# Patient Record
Sex: Male | Born: 1950 | Race: White | Hispanic: No | Marital: Married | Smoking: Former smoker
Health system: Southern US, Community
[De-identification: ages and names within clinical notes are randomized; demographics above are authoritative.]

## PROBLEM LIST (undated history)

## (undated) DIAGNOSIS — K5790 Diverticulosis of intestine, part unspecified, without perforation or abscess without bleeding: Secondary | ICD-10-CM

## (undated) DIAGNOSIS — Z8669 Personal history of other diseases of the nervous system and sense organs: Secondary | ICD-10-CM

## (undated) DIAGNOSIS — F419 Anxiety disorder, unspecified: Secondary | ICD-10-CM

## (undated) DIAGNOSIS — Z860101 Personal history of adenomatous and serrated colon polyps: Secondary | ICD-10-CM

## (undated) DIAGNOSIS — F32A Depression, unspecified: Secondary | ICD-10-CM

## (undated) DIAGNOSIS — F329 Major depressive disorder, single episode, unspecified: Secondary | ICD-10-CM

## (undated) DIAGNOSIS — T7840XA Allergy, unspecified, initial encounter: Secondary | ICD-10-CM

## (undated) DIAGNOSIS — D689 Coagulation defect, unspecified: Secondary | ICD-10-CM

## (undated) DIAGNOSIS — Z87718 Personal history of other specified (corrected) congenital malformations of genitourinary system: Secondary | ICD-10-CM

## (undated) DIAGNOSIS — Z8659 Personal history of other mental and behavioral disorders: Secondary | ICD-10-CM

## (undated) DIAGNOSIS — N183 Chronic kidney disease, stage 3 unspecified: Secondary | ICD-10-CM

## (undated) DIAGNOSIS — I82622 Acute embolism and thrombosis of deep veins of left upper extremity: Secondary | ICD-10-CM

## (undated) DIAGNOSIS — I10 Essential (primary) hypertension: Secondary | ICD-10-CM

## (undated) DIAGNOSIS — H269 Unspecified cataract: Secondary | ICD-10-CM

## (undated) DIAGNOSIS — H409 Unspecified glaucoma: Secondary | ICD-10-CM

## (undated) DIAGNOSIS — Z940 Kidney transplant status: Secondary | ICD-10-CM

## (undated) DIAGNOSIS — E782 Mixed hyperlipidemia: Secondary | ICD-10-CM

## (undated) DIAGNOSIS — N2889 Other specified disorders of kidney and ureter: Secondary | ICD-10-CM

## (undated) DIAGNOSIS — M109 Gout, unspecified: Secondary | ICD-10-CM

## (undated) DIAGNOSIS — Z86010 Personal history of colonic polyps: Secondary | ICD-10-CM

## (undated) DIAGNOSIS — M51369 Other intervertebral disc degeneration, lumbar region without mention of lumbar back pain or lower extremity pain: Secondary | ICD-10-CM

## (undated) DIAGNOSIS — K219 Gastro-esophageal reflux disease without esophagitis: Secondary | ICD-10-CM

## (undated) DIAGNOSIS — K2970 Gastritis, unspecified, without bleeding: Secondary | ICD-10-CM

## (undated) DIAGNOSIS — M5136 Other intervertebral disc degeneration, lumbar region: Secondary | ICD-10-CM

## (undated) DIAGNOSIS — E118 Type 2 diabetes mellitus with unspecified complications: Secondary | ICD-10-CM

## (undated) DIAGNOSIS — M1990 Unspecified osteoarthritis, unspecified site: Secondary | ICD-10-CM

## (undated) DIAGNOSIS — K5792 Diverticulitis of intestine, part unspecified, without perforation or abscess without bleeding: Secondary | ICD-10-CM

## (undated) DIAGNOSIS — Z85828 Personal history of other malignant neoplasm of skin: Secondary | ICD-10-CM

## (undated) HISTORY — DX: Personal history of colonic polyps: Z86.010

## (undated) HISTORY — DX: Personal history of other mental and behavioral disorders: Z86.59

## (undated) HISTORY — DX: Personal history of other malignant neoplasm of skin: Z85.828

## (undated) HISTORY — DX: Unspecified glaucoma: H40.9

## (undated) HISTORY — DX: Diverticulosis of intestine, part unspecified, without perforation or abscess without bleeding: K57.90

## (undated) HISTORY — DX: Allergy, unspecified, initial encounter: T78.40XA

## (undated) HISTORY — DX: Gastritis, unspecified, without bleeding: K29.70

## (undated) HISTORY — DX: Unspecified osteoarthritis, unspecified site: M19.90

## (undated) HISTORY — DX: Unspecified cataract: H26.9

## (undated) HISTORY — DX: Other specified disorders of kidney and ureter: N28.89

## (undated) HISTORY — DX: Other intervertebral disc degeneration, lumbar region: M51.36

## (undated) HISTORY — DX: Type 2 diabetes mellitus with unspecified complications: E11.8

## (undated) HISTORY — DX: Chronic kidney disease, stage 3 (moderate): N18.3

## (undated) HISTORY — DX: Coagulation defect, unspecified: D68.9

## (undated) HISTORY — DX: Kidney transplant status: Z94.0

## (undated) HISTORY — DX: Acute embolism and thrombosis of deep veins of left upper extremity: I82.622

## (undated) HISTORY — DX: Personal history of other diseases of the nervous system and sense organs: Z86.69

## (undated) HISTORY — PX: COLONOSCOPY: SHX174

## (undated) HISTORY — DX: Depression, unspecified: F32.A

## (undated) HISTORY — PX: EYE SURGERY: SHX253

## (undated) HISTORY — DX: Mixed hyperlipidemia: E78.2

## (undated) HISTORY — DX: Other intervertebral disc degeneration, lumbar region without mention of lumbar back pain or lower extremity pain: M51.369

## (undated) HISTORY — DX: Personal history of adenomatous and serrated colon polyps: Z86.0101

## (undated) HISTORY — DX: Anxiety disorder, unspecified: F41.9

## (undated) HISTORY — DX: Gout, unspecified: M10.9

## (undated) HISTORY — DX: Chronic kidney disease, stage 3 unspecified: N18.30

## (undated) HISTORY — PX: JOINT REPLACEMENT: SHX530

## (undated) HISTORY — DX: Major depressive disorder, single episode, unspecified: F32.9

---

## 2002-10-06 ENCOUNTER — Encounter (HOSPITAL_COMMUNITY): Admission: RE | Admit: 2002-10-06 | Discharge: 2003-01-04 | Payer: Self-pay | Admitting: Nephrology

## 2002-12-25 HISTORY — PX: KIDNEY TRANSPLANT: SHX239

## 2003-01-05 ENCOUNTER — Encounter (HOSPITAL_COMMUNITY): Admission: RE | Admit: 2003-01-05 | Discharge: 2003-04-05 | Payer: Self-pay | Admitting: Nephrology

## 2008-01-21 ENCOUNTER — Ambulatory Visit (HOSPITAL_COMMUNITY): Admission: RE | Admit: 2008-01-21 | Discharge: 2008-01-21 | Payer: Self-pay | Admitting: Nephrology

## 2008-01-31 ENCOUNTER — Encounter: Admission: RE | Admit: 2008-01-31 | Discharge: 2008-01-31 | Payer: Self-pay | Admitting: Nephrology

## 2011-01-15 ENCOUNTER — Encounter: Payer: Self-pay | Admitting: Nephrology

## 2012-01-02 DIAGNOSIS — N183 Chronic kidney disease, stage 3 unspecified: Secondary | ICD-10-CM | POA: Diagnosis not present

## 2012-01-02 DIAGNOSIS — Z79899 Other long term (current) drug therapy: Secondary | ICD-10-CM | POA: Diagnosis not present

## 2012-01-02 DIAGNOSIS — Z940 Kidney transplant status: Secondary | ICD-10-CM | POA: Diagnosis not present

## 2012-01-29 DIAGNOSIS — I10 Essential (primary) hypertension: Secondary | ICD-10-CM | POA: Diagnosis not present

## 2012-01-29 DIAGNOSIS — Z940 Kidney transplant status: Secondary | ICD-10-CM | POA: Diagnosis not present

## 2012-01-29 DIAGNOSIS — E785 Hyperlipidemia, unspecified: Secondary | ICD-10-CM | POA: Diagnosis not present

## 2012-01-29 DIAGNOSIS — Z48298 Encounter for aftercare following other organ transplant: Secondary | ICD-10-CM | POA: Diagnosis not present

## 2012-01-29 DIAGNOSIS — Z79899 Other long term (current) drug therapy: Secondary | ICD-10-CM | POA: Diagnosis not present

## 2012-01-30 DIAGNOSIS — H40019 Open angle with borderline findings, low risk, unspecified eye: Secondary | ICD-10-CM | POA: Diagnosis not present

## 2012-02-11 ENCOUNTER — Other Ambulatory Visit: Payer: Self-pay

## 2012-02-11 ENCOUNTER — Inpatient Hospital Stay (HOSPITAL_COMMUNITY)
Admission: EM | Admit: 2012-02-11 | Discharge: 2012-02-14 | DRG: 378 | Disposition: A | Discharge status: Home / Self Care | Payer: Managed Care, Other (non HMO) | Attending: Internal Medicine | Admitting: Internal Medicine

## 2012-02-11 ENCOUNTER — Encounter (HOSPITAL_COMMUNITY): Payer: Self-pay | Admitting: Family Medicine

## 2012-02-11 ENCOUNTER — Emergency Department (HOSPITAL_COMMUNITY): Payer: Managed Care, Other (non HMO)

## 2012-02-11 DIAGNOSIS — E119 Type 2 diabetes mellitus without complications: Secondary | ICD-10-CM | POA: Diagnosis not present

## 2012-02-11 DIAGNOSIS — K922 Gastrointestinal hemorrhage, unspecified: Principal | ICD-10-CM | POA: Diagnosis present

## 2012-02-11 DIAGNOSIS — K648 Other hemorrhoids: Secondary | ICD-10-CM | POA: Diagnosis present

## 2012-02-11 DIAGNOSIS — IMO0001 Reserved for inherently not codable concepts without codable children: Secondary | ICD-10-CM | POA: Diagnosis not present

## 2012-02-11 DIAGNOSIS — E785 Hyperlipidemia, unspecified: Secondary | ICD-10-CM

## 2012-02-11 DIAGNOSIS — R109 Unspecified abdominal pain: Secondary | ICD-10-CM | POA: Diagnosis present

## 2012-02-11 DIAGNOSIS — Z87891 Personal history of nicotine dependence: Secondary | ICD-10-CM

## 2012-02-11 DIAGNOSIS — D371 Neoplasm of uncertain behavior of stomach: Secondary | ICD-10-CM | POA: Diagnosis present

## 2012-02-11 DIAGNOSIS — Q613 Polycystic kidney, unspecified: Secondary | ICD-10-CM

## 2012-02-11 DIAGNOSIS — I10 Essential (primary) hypertension: Secondary | ICD-10-CM

## 2012-02-11 DIAGNOSIS — E118 Type 2 diabetes mellitus with unspecified complications: Secondary | ICD-10-CM

## 2012-02-11 DIAGNOSIS — N289 Disorder of kidney and ureter, unspecified: Secondary | ICD-10-CM

## 2012-02-11 DIAGNOSIS — Z940 Kidney transplant status: Secondary | ICD-10-CM

## 2012-02-11 DIAGNOSIS — K644 Residual hemorrhoidal skin tags: Secondary | ICD-10-CM | POA: Diagnosis present

## 2012-02-11 DIAGNOSIS — IMO0002 Reserved for concepts with insufficient information to code with codable children: Secondary | ICD-10-CM

## 2012-02-11 DIAGNOSIS — Z87718 Personal history of other specified (corrected) congenital malformations of genitourinary system: Secondary | ICD-10-CM

## 2012-02-11 HISTORY — DX: Gastro-esophageal reflux disease without esophagitis: K21.9

## 2012-02-11 HISTORY — DX: Personal history of other specified (corrected) congenital malformations of genitourinary system: Z87.718

## 2012-02-11 HISTORY — DX: Essential (primary) hypertension: I10

## 2012-02-11 LAB — CBC
HCT: 40.2 % (ref 39.0–52.0)
Platelets: 159 10*3/uL (ref 150–400)
RDW: 14.1 % (ref 11.5–15.5)
WBC: 11.9 10*3/uL — ABNORMAL HIGH (ref 4.0–10.5)

## 2012-02-11 LAB — COMPREHENSIVE METABOLIC PANEL
ALT: 15 U/L (ref 0–53)
AST: 14 U/L (ref 0–37)
Albumin: 3.4 g/dL — ABNORMAL LOW (ref 3.5–5.2)
CO2: 20 mEq/L (ref 19–32)
Chloride: 101 mEq/L (ref 96–112)
GFR calc non Af Amer: 52 mL/min — ABNORMAL LOW (ref >90)
Potassium: 3.4 mEq/L — ABNORMAL LOW (ref 3.5–5.1)
Sodium: 134 mEq/L — ABNORMAL LOW (ref 135–145)
Total Bilirubin: 0.5 mg/dL (ref 0.3–1.2)

## 2012-02-11 LAB — DIFFERENTIAL
Basophils Absolute: 0 10*3/uL (ref 0.0–0.1)
Lymphocytes Relative: 24 % (ref 12–46)
Monocytes Absolute: 0.8 10*3/uL (ref 0.1–1.0)
Neutro Abs: 8.1 10*3/uL — ABNORMAL HIGH (ref 1.7–7.7)
Neutrophils Relative %: 68 % (ref 43–77)

## 2012-02-11 LAB — TYPE AND SCREEN: Antibody Screen: NEGATIVE

## 2012-02-11 MED ORDER — SODIUM CHLORIDE 0.9 % IV BOLUS (SEPSIS)
1000.0000 mL | Freq: Once | INTRAVENOUS | Status: AC
  Administered 2012-02-11 (×2): 1000 mL via INTRAVENOUS

## 2012-02-11 MED ORDER — TACROLIMUS 1 MG PO CAPS
2.0000 mg | ORAL_CAPSULE | Freq: Two times a day (BID) | ORAL | Status: DC
  Administered 2012-02-12 (×2): 2 mg via ORAL
  Filled 2012-02-11 (×5): qty 2

## 2012-02-11 MED ORDER — PANTOPRAZOLE SODIUM 40 MG IV SOLR
40.0000 mg | Freq: Two times a day (BID) | INTRAVENOUS | Status: DC
  Administered 2012-02-12 – 2012-02-13 (×4): 40 mg via INTRAVENOUS
  Filled 2012-02-11 (×5): qty 40

## 2012-02-11 MED ORDER — ALPRAZOLAM 0.25 MG PO TABS
0.2500 mg | ORAL_TABLET | Freq: Every evening | ORAL | Status: DC | PRN
  Administered 2012-02-12 – 2012-02-13 (×2): 0.25 mg via ORAL
  Filled 2012-02-11 (×2): qty 1

## 2012-02-11 MED ORDER — POTASSIUM CHLORIDE IN NACL 20-0.9 MEQ/L-% IV SOLN
INTRAVENOUS | Status: DC
  Administered 2012-02-12 – 2012-02-14 (×6): via INTRAVENOUS
  Filled 2012-02-11 (×11): qty 1000

## 2012-02-11 MED ORDER — METOPROLOL TARTRATE 50 MG PO TABS
50.0000 mg | ORAL_TABLET | Freq: Two times a day (BID) | ORAL | Status: DC
  Administered 2012-02-12 – 2012-02-14 (×5): 50 mg via ORAL
  Filled 2012-02-11: qty 1
  Filled 2012-02-11: qty 2
  Filled 2012-02-11 (×5): qty 1

## 2012-02-11 MED ORDER — MYCOPHENOLATE MOFETIL 250 MG PO CAPS
250.0000 mg | ORAL_CAPSULE | Freq: Two times a day (BID) | ORAL | Status: DC
  Administered 2012-02-12 (×2): 250 mg via ORAL
  Filled 2012-02-11 (×5): qty 1

## 2012-02-11 NOTE — ED Provider Notes (Signed)
History     CSN: [id number]  Arrival date & time 02/11/12  1847   First MD Initiated Contact with Patient 02/11/12 2002      Chief Complaint  Patient presents with  . GI Bleeding    (Consider location/radiation/quality/duration/timing/severity/associated sxs/prior treatment) The history is provided by the patient.   61 year old male comes in with bright red blood per temp x3 this afternoon. This is associated with crampy lower abdominal pain which is worse on the right. Pain was severe and rated at 9/10 at its worst, but it is mild and rated at 2/10 currently. There is associated nausea but no vomiting. He has no prior history of rectal bleeding. He states he had a colonoscopy 5 years ago and was told it was normal. He has noticed dizziness and lightheadedness. He denies a history of diverticulosis. He is on aspirin daily but denies being on any other anticoagulants or other antiplatelet medications. 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  No past medical history on file.  No past surgical history on file.  No family history on file.  History  Substance Use Topics  . Smoking status: Not on file  . Smokeless tobacco: Not on file  . Alcohol Use: Not on file      Review of Systems  All other systems reviewed and are negative.    Allergies  Penicillins  Home Medications   Current Outpatient Rx  Name Route Sig Dispense Refill  . ALPRAZOLAM 0.25 MG PO TABS Oral Take 0.25 mg by mouth at bedtime as needed. PAIN    . AMLODIPINE BESYLATE 2.5 MG PO TABS Oral Take 2.5 mg by mouth daily.    Marland Kitchen VITAMIN D 1000 UNITS PO TABS Oral Take 1,000 Units by mouth daily.    Marland Kitchen FLUOXETINE HCL 20 MG PO CAPS Oral Take 20 mg by mouth daily.    Marland Kitchen METOPROLOL TARTRATE 50 MG PO TABS Oral Take 50 mg by mouth 2 (two) times daily.    Marland Kitchen MYCOPHENOLATE MOFETIL 250 MG PO CAPS Oral Take by mouth 2 (two) times daily.    Marland Kitchen PREDNISONE 5 MG PO TABS Oral Take 5  mg by mouth daily.    Marland Kitchen SIMVASTATIN 5 MG PO TABS Oral Take 5 mg by mouth daily.    Marland Kitchen TACROLIMUS 1 MG PO CAPS Oral Take 2 mg by mouth 2 (two) times daily. TAKE 2 TABLETS IN THE AM AND  1 TABLET INT THE PM    . VITAMIN B-12 1000 MCG PO TABS Oral Take 1,000 mcg by mouth daily.      BP 109/60  Pulse 98  Resp 22  SpO2 99%  Physical Exam  Nursing note and vitals reviewed.  61 year old male who is resting comfortably and in no acute distress. Vital signs are significant for mild tachypnea with respiratory rate of 22. Oxygen saturation is 99% which is normal. Head is normocephalic and atraumatic. PERRLA, EOMI appeared oropharynx is clear. Neck is nontender and supple. Back is nontender. Lungs are clear without rales, wheezes, rhonchi. Heart has regular rate and rhythm without murmur. Abdomen is soft, flat, with mild tenderness in the right lower abdomen without rebound or guarding. Peristalsis is hyperactive. Transplanted kidney is palpable in the left lower quadrant. Extremities have no cyanosis or edema, full range of motion is present. Skin is warm and dry without rash. Neurologic: Mental status is normal, cranial nerves are intact, there no focal motor or sensory deficits.  ED Course  Procedures (including critical care time)  Results for orders placed during the hospital encounter of 02/11/12  CBC      Component Value Range   WBC 11.9 (*) 4.0 - 10.5 (K/uL)   RBC 4.15 (*) 4.22 - 5.81 (MIL/uL)   Hemoglobin 13.5  13.0 - 17.0 (g/dL)   HCT 16.1  09.6 - 04.5 (%)   MCV 96.9  78.0 - 100.0 (fL)   MCH 32.5  26.0 - 34.0 (pg)   MCHC 33.6  30.0 - 36.0 (g/dL)   RDW 40.9  81.1 - 91.4 (%)   Platelets 159  150 - 400 (K/uL)  DIFFERENTIAL      Component Value Range   Neutrophils Relative 68  43 - 77 (%)   Neutro Abs 8.1 (*) 1.7 - 7.7 (K/uL)   Lymphocytes Relative 24  12 - 46 (%)   Lymphs Abs 2.8  0.7 - 4.0 (K/uL)   Monocytes Relative 7  3 - 12 (%)   Monocytes Absolute 0.8  0.1 - 1.0 (K/uL)    Eosinophils Relative 1  0 - 5 (%)   Eosinophils Absolute 0.1  0.0 - 0.7 (K/uL)   Basophils Relative 0  0 - 1 (%)   Basophils Absolute 0.0  0.0 - 0.1 (K/uL)  COMPREHENSIVE METABOLIC PANEL      Component Value Range   Sodium 134 (*) 135 - 145 (mEq/L)   Potassium 3.4 (*) 3.5 - 5.1 (mEq/L)   Chloride 101  96 - 112 (mEq/L)   CO2 20  19 - 32 (mEq/L)   Glucose, Bld 188 (*) 70 - 99 (mg/dL)   BUN 29 (*) 6 - 23 (mg/dL)   Creatinine, Ser 7.82 (*) 0.50 - 1.35 (mg/dL)   Calcium 8.9  8.4 - 95.6 (mg/dL)   Total Protein 6.3  6.0 - 8.3 (g/dL)   Albumin 3.4 (*) 3.5 - 5.2 (g/dL)   AST 14  0 - 37 (U/L)   ALT 15  0 - 53 (U/L)   Alkaline Phosphatase 71  39 - 117 (U/L)   Total Bilirubin 0.5  0.3 - 1.2 (mg/dL)   GFR calc non Af Amer 52 (*) >90 (mL/min)   GFR calc Af Amer 61 (*) >90 (mL/min)  PROTIME-INR      Component Value Range   Prothrombin Time 15.1  11.6 - 15.2 (seconds)   INR 1.17  0.00 - 1.49   APTT      Component Value Range   aPTT 35  24 - 37 (seconds)  TYPE AND SCREEN      Component Value Range   ABO/RH(D) A NEG     Antibody Screen NEG     Sample Expiration 02/14/2012    ABO/RH      Component Value Range   ABO/RH(D) A NEG     Dg Chest Portable 1 View  02/11/2012  *RADIOLOGY REPORT*  Clinical Data: GI bleeding; history of diabetes and hypertension.  PORTABLE CHEST - 1 VIEW  Comparison: None.  Findings: The lungs are well-aerated and clear.  There is no evidence of focal opacification, pleural effusion or pneumothorax.  The cardiomediastinal silhouette is within normal limits.  No acute osseous abnormalities are seen.  IMPRESSION: No acute cardiopulmonary process seen.  Original Report Authenticated By: Tonia Ghent, M.D.      Date: 02/11/2012  Rate: 74  Rhythm: normal sinus rhythm  QRS Axis: normal  Intervals: normal  ST/T Wave abnormalities: nonspecific T wave changes  Conduction Disutrbances:none  Narrative Interpretation:  Nonspecific T wave flattening. No old ECG available for  comparison.  Old EKG Reviewed: none available  Hemoglobin is normal, but it may take some time for hemoglobin did drop after acute bleeding. His blood pressure dropped to 91 systolic and will be given additional saline bolus. I discussed with case with Dr. Ewing Schlein will see the patient in consult. Case is also discussed with Dr. Joneen Roach who agrees to come and admit the patient.   1. Lower GI bleed   2. History of renal transplant   3. Renal insufficiency       MDM  Rectal bleeding which was confirmed by RN who is all a commode hold blood will after the patient went to the bathroom in the emergency department. 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 However, I suspect that he may have actually had diverticulosis which she does not recall, and that is also likely etiology. He will be given IV fluids and low blood will be obtained for type and screen. He will need to be admitted and GI consultation will be needed.        Dione Booze, MD 02/11/12 2242

## 2012-02-11 NOTE — H&P (Signed)
PCP:   James Boys, MD, MD  GI Dr. Randa Zimmerman with [facility] Nephrologist Dr. Ernie Zimmerman with [facility]   Chief Complaint:  Blood in stool  HPI: This is a 61 year old gentleman who states that today at approximately 5:00 PM he started passing blood in the stool. He states the color has ranged from red to dark red. He does report pain in the right lower quadrant, along with cramping. He states he's not coughed up blood. But he does take a baby aspirin daily, he also take by mouth steroids daily - a low dosage. He states he eats a lot of spicy food and has a mark GERD, for which she now takes an over-the-counter medications for. He additionally states he drinks 3-4 glasses of wine daily. He has never had a history of a stomach ulcer or a GI bleed. His last colonoscopy was 5 years ago, he states this was normal. Patient has a history of renal transplant and he states his baseline creatinine is approximately 1.3 - 1.5.  Review of Systems: Positives bolded   anorexia, fever, weight loss,, vision loss, decreased hearing, hoarseness, chest pain, syncope, dyspnea on exertion, peripheral edema, balance deficits, hemoptysis, abdominal pain, melena, hematochezia, severe indigestion/heartburn, hematuria, incontinence, genital sores, muscle weakness, suspicious skin lesions, transient blindness, difficulty walking, depression, unusual weight change, abnormal bleeding, enlarged lymph nodes, angioedema, and breast masses.  Past Medical History: Past Medical History  Diagnosis Date  . Diabetes mellitus   . H/O polycystic kidney disease   . Hypertension   . Hyperlipidemia    Past Surgical History  Procedure Date  . Kidney transplant     Medications: Prior to Admission medications   Medication Sig Start Date End Date Taking? Authorizing Provider  ALPRAZolam (XANAX) 0.25 MG tablet Take 0.25 mg by mouth at bedtime as needed. PAIN   Yes Historical Provider, MD  amLODipine (NORVASC) 2.5 MG tablet Take  2.5 mg by mouth daily.   Yes Historical Provider, MD  cholecalciferol (VITAMIN D) 1000 UNITS tablet Take 1,000 Units by mouth daily.   Yes Historical Provider, MD  FLUoxetine (PROZAC) 20 MG capsule Take 20 mg by mouth daily.   Yes Historical Provider, MD  metoprolol (LOPRESSOR) 50 MG tablet Take 50 mg by mouth 2 (two) times daily.   Yes Historical Provider, MD  mycophenolate (CELLCEPT) 250 MG capsule Take by mouth 2 (two) times daily.   Yes Historical Provider, MD  predniSONE (DELTASONE) 5 MG tablet Take 5 mg by mouth daily.   Yes Historical Provider, MD  simvastatin (ZOCOR) 5 MG tablet Take 5 mg by mouth daily.   Yes Historical Provider, MD  tacrolimus (PROGRAF) 1 MG capsule Take 2 mg by mouth 2 (two) times daily. TAKE 2 TABLETS IN THE AM AND  1 TABLET INT THE PM   Yes Historical Provider, MD  vitamin B-12 (CYANOCOBALAMIN) 1000 MCG tablet Take 1,000 mcg by mouth daily.   Yes Historical Provider, MD    Allergies:   Allergies  Allergen Reactions  . Penicillins Rash    Social History:  reports that he has quit smoking. He does not have any smokeless tobacco history on file. He reports that he drinks alcohol. He reports that he does not use illicit drugs.  Family History: Family History  Problem Relation Age of Onset  . Polycystic kidney disease      Physical Exam: Filed Vitals:   02/11/12 2245 02/11/12 2300 02/11/12 2315 02/11/12 2330  BP: 115/66 111/67 110/71 112/67  Pulse: 65  67 67 65  Resp: 24 19 19 23   SpO2: 100% 99% 100% 99%    General:  Alert and oriented times three, well developed and nourished, no acute distress Eyes: PERRLA, pink conjunctiva, no scleral icterus ENT: Moist oral mucosa, neck supple, no thyromegaly Lungs: clear to ascultation, no wheeze, no crackles, no use of accessory muscles Cardiovascular: regular rate and rhythm, no regurgitation, no gallops, no murmurs. No carotid bruits, no JVD Abdomen: soft, positive BS, nonspecific tenderness to palpation,  non-distended, no organomegaly, not an acute abdomen GU: not examined Neuro: CN II - XII grossly intact, sensation intact Musculoskeletal: strength 5/5 all extremities, no clubbing, cyanosis or edema Skin: no rash, no subcutaneous crepitation, no decubitus Psych: appropriate patient   Labs on Admission:   [facility] 02/11/12 1943  NA 134*  K 3.4*  CL 101  CO2 20  GLUCOSE 188*  BUN 29*  CREATININE 1.41*  CALCIUM 8.9  MG --  PHOS --  Results for James Zimmerman (MRN [id number]) as of 02/11/2012 23:37  Ref. Range 02/11/2012 20:55  Prothrombin Time Latest Range: 11.6-15.2 seconds 15.1  INR Latest Range: 0.00-1.49  1.17  aPTT Latest Range: 24-37 seconds 35    Basename 02/11/12 1943  AST 14  ALT 15  ALKPHOS 71  BILITOT 0.5  PROT 6.3  ALBUMIN 3.4*   No results found for this basename: LIPASE:2,AMYLASE:2 in the last 72 hours  Basename 02/11/12 1943  WBC 11.9*  NEUTROABS 8.1*  HGB 13.5  HCT 40.2  MCV 96.9  PLT 159   No results found for this basename: CKTOTAL:3,CKMB:3,CKMBINDEX:3,TROPONINI:3 in the last 72 hours No components found with this basename: POCBNP:3 No results found for this basename: DDIMER:2 in the last 72 hours No results found for this basename: HGBA1C:2 in the last 72 hours No results found for this basename: CHOL:2,HDL:2,LDLCALC:2,TRIG:2,CHOLHDL:2,LDLDIRECT:2 in the last 72 hours No results found for this basename: TSH,T4TOTAL,FREET3,T3FREE,THYROIDAB in the last 72 hours No results found for this basename: VITAMINB12:2,FOLATE:2,FERRITIN:2,TIBC:2,IRON:2,RETICCTPCT:2 in the last 72 hours  Micro Results: No results found for this or any previous visit (from the past 240 hour(s)).   Radiological Exams on Admission: Dg Chest Portable 1 View  02/11/2012  *RADIOLOGY REPORT*  Clinical Data: GI bleeding; history of diabetes and hypertension.  PORTABLE CHEST - 1 VIEW  Comparison: None.  Findings: The lungs are well-aerated and clear.  There is no evidence of  focal opacification, pleural effusion or pneumothorax.  The cardiomediastinal silhouette is within normal limits.  No acute osseous abnormalities are seen.  IMPRESSION: No acute cardiopulmonary process seen.  Original Report Authenticated By: Tonia Ghent, M.D.    Assessment/Plan Present on Admission:  .GI bleed Admit to telemetry May be upper or lower GI bleed. Patient does have her structures for stomach ulcers including steroid and aspirin use daily spicy foods and chronic GERD N.p.o., IV fluids Type and cross and transfuse as needed GI consulted IV Protonix  Status post renal transplant History of polycystic kidney disease Stable, Prograf level ordered. Unable to find CellCept level to order Patient sees Dr. Ernie Zimmerman with [facility] Diabetes mellitus Hypertension Patient is n.p.o. Stable home medications resume with hold parameters for blood pressure medications for systolic blood pressure less than 120  Full code SCDs for DVT prophylaxis Team 2/Dr. Domingo Madeira, Gissel Keilman 02/11/2012, 11:37 PM

## 2012-02-11 NOTE — ED Notes (Signed)
Patient arrived with complaint of passing lots of blood in stool. Used restroom at triage and again passed large amounts of bright red blood, patient pale and weak, near syncopal

## 2012-02-12 ENCOUNTER — Encounter (HOSPITAL_COMMUNITY): Payer: Self-pay | Admitting: Gastroenterology

## 2012-02-12 DIAGNOSIS — IMO0002 Reserved for concepts with insufficient information to code with codable children: Secondary | ICD-10-CM | POA: Diagnosis not present

## 2012-02-12 DIAGNOSIS — I10 Essential (primary) hypertension: Secondary | ICD-10-CM | POA: Diagnosis present

## 2012-02-12 DIAGNOSIS — D131 Benign neoplasm of stomach: Secondary | ICD-10-CM | POA: Diagnosis not present

## 2012-02-12 DIAGNOSIS — K648 Other hemorrhoids: Secondary | ICD-10-CM | POA: Diagnosis present

## 2012-02-12 DIAGNOSIS — K921 Melena: Secondary | ICD-10-CM | POA: Diagnosis not present

## 2012-02-12 DIAGNOSIS — Z87891 Personal history of nicotine dependence: Secondary | ICD-10-CM | POA: Diagnosis not present

## 2012-02-12 DIAGNOSIS — K644 Residual hemorrhoidal skin tags: Secondary | ICD-10-CM | POA: Diagnosis present

## 2012-02-12 DIAGNOSIS — K5730 Diverticulosis of large intestine without perforation or abscess without bleeding: Secondary | ICD-10-CM | POA: Diagnosis not present

## 2012-02-12 DIAGNOSIS — D371 Neoplasm of uncertain behavior of stomach: Secondary | ICD-10-CM | POA: Diagnosis present

## 2012-02-12 DIAGNOSIS — R109 Unspecified abdominal pain: Secondary | ICD-10-CM | POA: Diagnosis present

## 2012-02-12 DIAGNOSIS — E119 Type 2 diabetes mellitus without complications: Secondary | ICD-10-CM | POA: Diagnosis present

## 2012-02-12 DIAGNOSIS — E785 Hyperlipidemia, unspecified: Secondary | ICD-10-CM | POA: Diagnosis present

## 2012-02-12 DIAGNOSIS — D500 Iron deficiency anemia secondary to blood loss (chronic): Secondary | ICD-10-CM | POA: Diagnosis not present

## 2012-02-12 DIAGNOSIS — K922 Gastrointestinal hemorrhage, unspecified: Secondary | ICD-10-CM | POA: Diagnosis present

## 2012-02-12 DIAGNOSIS — Z940 Kidney transplant status: Secondary | ICD-10-CM | POA: Diagnosis not present

## 2012-02-12 DIAGNOSIS — K449 Diaphragmatic hernia without obstruction or gangrene: Secondary | ICD-10-CM | POA: Diagnosis not present

## 2012-02-12 DIAGNOSIS — Q613 Polycystic kidney, unspecified: Secondary | ICD-10-CM | POA: Diagnosis not present

## 2012-02-12 DIAGNOSIS — K209 Esophagitis, unspecified without bleeding: Secondary | ICD-10-CM | POA: Diagnosis not present

## 2012-02-12 DIAGNOSIS — K5732 Diverticulitis of large intestine without perforation or abscess without bleeding: Secondary | ICD-10-CM | POA: Diagnosis not present

## 2012-02-12 DIAGNOSIS — D126 Benign neoplasm of colon, unspecified: Secondary | ICD-10-CM | POA: Diagnosis not present

## 2012-02-12 DIAGNOSIS — K319 Disease of stomach and duodenum, unspecified: Secondary | ICD-10-CM | POA: Diagnosis not present

## 2012-02-12 LAB — CBC
HCT: 33.4 % — ABNORMAL LOW (ref 39.0–52.0)
MCH: 32.1 pg (ref 26.0–34.0)
MCV: 97.4 fL (ref 78.0–100.0)
Platelets: 121 10*3/uL — ABNORMAL LOW (ref 150–400)
RDW: 14.4 % (ref 11.5–15.5)
WBC: 8.2 10*3/uL (ref 4.0–10.5)

## 2012-02-12 LAB — BASIC METABOLIC PANEL
BUN: 24 mg/dL — ABNORMAL HIGH (ref 6–23)
BUN: 19 mg/dL (ref 6–23)
CO2: 22 mEq/L (ref 19–32)
CO2: 22 mEq/L (ref 19–32)
Calcium: 7.9 mg/dL — ABNORMAL LOW (ref 8.4–10.5)
Chloride: 108 mEq/L (ref 96–112)
Chloride: 109 mEq/L (ref 96–112)
Creatinine, Ser: 1.34 mg/dL (ref 0.50–1.35)
GFR calc Af Amer: 66 mL/min — ABNORMAL LOW (ref >90)
Glucose, Bld: 147 mg/dL — ABNORMAL HIGH (ref 70–99)
Potassium: 4.2 mEq/L (ref 3.5–5.1)

## 2012-02-12 LAB — ABO/RH: ABO/RH(D): A NEG

## 2012-02-12 LAB — HEMOGLOBIN AND HEMATOCRIT, BLOOD
Hemoglobin: 11.2 g/dL — ABNORMAL LOW (ref 13.0–17.0)
Hemoglobin: 11.2 g/dL — ABNORMAL LOW (ref 13.0–17.0)

## 2012-02-12 LAB — GLUCOSE, CAPILLARY: Glucose-Capillary: 128 mg/dL — ABNORMAL HIGH (ref 70–99)

## 2012-02-12 MED ORDER — INSULIN ASPART 100 UNIT/ML ~~LOC~~ SOLN
0.0000 [IU] | Freq: Three times a day (TID) | SUBCUTANEOUS | Status: DC
  Administered 2012-02-12: 1 [IU] via SUBCUTANEOUS
  Filled 2012-02-12: qty 3

## 2012-02-12 MED ORDER — MORPHINE SULFATE 2 MG/ML IJ SOLN: 1.0000 mg | INTRAMUSCULAR | Status: DC | PRN

## 2012-02-12 MED ORDER — PREDNISONE 5 MG PO TABS
5.0000 mg | ORAL_TABLET | Freq: Every day | ORAL | Status: DC
  Administered 2012-02-12 – 2012-02-14 (×3): 5 mg via ORAL
  Filled 2012-02-12 (×3): qty 1

## 2012-02-12 MED ORDER — PEG 3350-KCL-NA BICARB-NACL 420 G PO SOLR
4000.0000 mL | Freq: Once | ORAL | Status: AC
  Administered 2012-02-12: 4000 mL via ORAL

## 2012-02-12 MED ORDER — AMLODIPINE BESYLATE 2.5 MG PO TABS
2.5000 mg | ORAL_TABLET | Freq: Every day | ORAL | Status: DC
  Administered 2012-02-12 – 2012-02-14 (×3): 2.5 mg via ORAL
  Filled 2012-02-12 (×3): qty 1

## 2012-02-12 MED ORDER — INSULIN ASPART 100 UNIT/ML ~~LOC~~ SOLN
0.0000 [IU] | Freq: Every day | SUBCUTANEOUS | Status: DC
  Filled 2012-02-12: qty 3

## 2012-02-12 MED ORDER — FLUOXETINE HCL 20 MG PO CAPS
20.0000 mg | ORAL_CAPSULE | Freq: Every day | ORAL | Status: DC
  Administered 2012-02-12 – 2012-02-13 (×2): 20 mg via ORAL
  Filled 2012-02-12 (×3): qty 1

## 2012-02-12 MED ORDER — SIMVASTATIN 5 MG PO TABS
5.0000 mg | ORAL_TABLET | Freq: Every day | ORAL | Status: DC
  Administered 2012-02-12 – 2012-02-13 (×2): 5 mg via ORAL
  Filled 2012-02-12 (×3): qty 1

## 2012-02-12 NOTE — Progress Notes (Signed)
UR completed 

## 2012-02-12 NOTE — Progress Notes (Signed)
Patient ID: James Zimmerman    ZOX:[id number]    DOB: 05-19-1951    DOA: 02/11/2012  PCP: Lenora Boys, MD, MD  Subjective: No BM since morning  Objective: Weight change:   Intake/Output Summary (Last 24 hours) at 02/12/12 1437 Last data filed at 02/12/12 1414  Gross per 24 hour  Intake    360 ml  Output      6 ml  Net    354 ml   Blood pressure 129/77, pulse 67, temperature 97.2 F (36.2 C), temperature source Oral, resp. rate 16, height 5' 11.65" (1.82 m), weight 103.3 kg (227 lb 11.8 oz), SpO2 96.00%.  Physical Exam: General: Alert and awake, oriented x3, not in any acute distress. HEENT: anicteric sclera, pupils reactive to light and accommodation, EOMI CVS: S1-S2 clear, no murmur rubs or gallops Chest: clear to auscultation bilaterally, no wheezing, rales or rhonchi Abdomen: soft nontender, nondistended, normal bowel sounds, no organomegaly Extremities: no cyanosis, clubbing or edema noted bilaterally Neuro: Cranial nerves II-XII intact, no focal neurological deficits  Lab Results: Basic Metabolic Panel:  Lab 02/12/12 8119 02/12/12 0310  NA 138 138  K 4.2 3.9  CL 109 108  CO2 22 22  GLUCOSE 153* 147*  BUN 19 24*  CREATININE 1.32 1.34  CALCIUM 8.1* 7.9*  MG -- --  PHOS -- --   Liver Function Tests:  Lab 02/11/12 1943  AST 14  ALT 15  ALKPHOS 71  BILITOT 0.5  PROT 6.3  ALBUMIN 3.4*   CBC:  Lab 02/12/12 1110 02/12/12 0310 02/11/12 1943  WBC -- 8.2 11.9*  NEUTROABS -- -- 8.1*  HGB 11.2* 11.0* --  HCT 33.8* 33.4* --  MCV -- 97.4 96.9  PLT -- 121* 159   Studies/Results: Dg Chest Portable 1 View  02/11/2012  *RADIOLOGY REPORT*  Clinical Data: GI bleeding; history of diabetes and hypertension.  PORTABLE CHEST - 1 VIEW  Comparison: None.  Findings: The lungs are well-aerated and clear.  There is no evidence of focal opacification, pleural effusion or pneumothorax.  The cardiomediastinal silhouette is within normal limits.  No acute osseous  abnormalities are seen.  IMPRESSION: No acute cardiopulmonary process seen.  Original Report Authenticated By: Tonia Ghent, M.D.    Medications: Scheduled Meds:   . amLODipine  2.5 mg Oral Daily  . FLUoxetine  20 mg Oral Daily  . metoprolol  50 mg Oral BID  . mycophenolate  250 mg Oral BID  . pantoprazole (PROTONIX) IV  40 mg Intravenous Q12H  . polyethylene glycol-electrolytes  4,000 mL Oral Once  . predniSONE  5 mg Oral Q breakfast  . simvastatin  5 mg Oral q1800  . sodium chloride  1,000 mL Intravenous Once  . sodium chloride  1,000 mL Intravenous Once  . tacrolimus  2 mg Oral BID   Continuous Infusions:   . 0.9 % NaCl with KCl 20 mEq / L 100 mL/hr at 02/12/12 1157     Assessment/Plan: Principal Problem:  *GI bleed: - Continue H&H every 12 hours, clear liquid diet, EGD and colonoscopy planned for tomorrow morning  Active Problems:  H/O polycystic kidney disease: Continue prednisone, tacrolimus, CellCept   Diabetes mellitus - Obtain HbA1c, place on sliding scale insulin   Hypertension: Stable, on Norvasc and Lopressor   Hyperlipidemia: On Zocor  DVT Prophylaxis: SCDs  Code Status: Full code  Disposition: Pending EGD/coloscopy tomorrow   LOS: 1 day   Kriste Broman M.D. [facility] 02/12/2012, 2:37 PM Pager: [phone]

## 2012-02-12 NOTE — Consult Note (Signed)
Reason for Consult: GI bleeding probably lower Referring Physician: Hospital team  James Zimmerman is an 61 y.o. male.  HPI: Patient seen by my partner 5-1/2 years ago with a normal screening colonoscopy who has had some vague GI symptoms for years including occasional right-sided pain and some diarrhea since his transplant and some upper tract symptoms helped by Nexium and more recently omeprazole who began bleeding yesterday mostly bright red possibly with a few clots but some dark blood in the commode however he seems to be better now. And has not had a bowel movement since last night. He's had no previous bleeding but is on an aspirin a day and minimizes other over-the-counter medicine. His family history is pertinent for mother with ulcers but no other GI issues Past Medical History  Diagnosis Date  . Diabetes mellitus   . H/O polycystic kidney disease   . Hypertension   . Hyperlipidemia     Past Surgical History  Procedure Date  . Kidney transplant     Family History  Problem Relation Age of Onset  . Polycystic kidney disease      Social History:  reports that he has quit smoking. He does not have any smokeless tobacco history on file. He reports that he drinks alcohol. He reports that he does not use illicit drugs.  Allergies:  Allergies  Allergen Reactions  . Penicillins Rash    Medications: I have reviewed the patient's current medications.  Results for orders placed during the hospital encounter of 02/11/12 (from the past 48 hour(s))  CBC     Status: Abnormal   Collection Time   02/11/12  7:43 PM      Component Value Range Comment   WBC 11.9 (*) 4.0 - 10.5 (K/uL)    RBC 4.15 (*) 4.22 - 5.81 (MIL/uL)    Hemoglobin 13.5  13.0 - 17.0 (g/dL)    HCT 69.6  29.5 - 28.4 (%)    MCV 96.9  78.0 - 100.0 (fL)    MCH 32.5  26.0 - 34.0 (pg)    MCHC 33.6  30.0 - 36.0 (g/dL)    RDW 13.2  44.0 - 10.2 (%)    Platelets 159  150 - 400 (K/uL)   DIFFERENTIAL     Status: Abnormal   Collection Time   02/11/12  7:43 PM      Component Value Range Comment   Neutrophils Relative 68  43 - 77 (%)    Neutro Abs 8.1 (*) 1.7 - 7.7 (K/uL)    Lymphocytes Relative 24  12 - 46 (%)    Lymphs Abs 2.8  0.7 - 4.0 (K/uL)    Monocytes Relative 7  3 - 12 (%)    Monocytes Absolute 0.8  0.1 - 1.0 (K/uL)    Eosinophils Relative 1  0 - 5 (%)    Eosinophils Absolute 0.1  0.0 - 0.7 (K/uL)    Basophils Relative 0  0 - 1 (%)    Basophils Absolute 0.0  0.0 - 0.1 (K/uL)   COMPREHENSIVE METABOLIC PANEL     Status: Abnormal   Collection Time   02/11/12  7:43 PM      Component Value Range Comment   Sodium 134 (*) 135 - 145 (mEq/L)    Potassium 3.4 (*) 3.5 - 5.1 (mEq/L)    Chloride 101  96 - 112 (mEq/L)    CO2 20  19 - 32 (mEq/L)    Glucose, Bld 188 (*) 70 - 99 (mg/dL)  BUN 29 (*) 6 - 23 (mg/dL)    Creatinine, Ser 1.61 (*) 0.50 - 1.35 (mg/dL)    Calcium 8.9  8.4 - 10.5 (mg/dL)    Total Protein 6.3  6.0 - 8.3 (g/dL)    Albumin 3.4 (*) 3.5 - 5.2 (g/dL)    AST 14  0 - 37 (U/L)    ALT 15  0 - 53 (U/L)    Alkaline Phosphatase 71  39 - 117 (U/L)    Total Bilirubin 0.5  0.3 - 1.2 (mg/dL)    GFR calc non Af Amer 52 (*) >90 (mL/min)    GFR calc Af Amer 61 (*) >90 (mL/min)   TYPE AND SCREEN     Status: Normal   Collection Time   02/11/12  8:20 PM      Component Value Range Comment   ABO/RH(D) A NEG      Antibody Screen NEG      Sample Expiration 02/14/2012     PROTIME-INR     Status: Normal   Collection Time   02/11/12  8:55 PM      Component Value Range Comment   Prothrombin Time 15.1  11.6 - 15.2 (seconds)    INR 1.17  0.00 - 1.49    APTT     Status: Normal   Collection Time   02/11/12  8:55 PM      Component Value Range Comment   aPTT 35  24 - 37 (seconds)   ABO/RH     Status: Normal   Collection Time   02/11/12  9:46 PM      Component Value Range Comment   ABO/RH(D) A NEG     BASIC METABOLIC PANEL     Status: Abnormal   Collection Time   02/12/12  3:10 AM      Component Value  Range Comment   Sodium 138  135 - 145 (mEq/L)    Potassium 3.9  3.5 - 5.1 (mEq/L)    Chloride 108  96 - 112 (mEq/L)    CO2 22  19 - 32 (mEq/L)    Glucose, Bld 147 (*) 70 - 99 (mg/dL)    BUN 24 (*) 6 - 23 (mg/dL)    Creatinine, Ser 0.96  0.50 - 1.35 (mg/dL)    Calcium 7.9 (*) 8.4 - 10.5 (mg/dL)    GFR calc non Af Amer 56 (*) >90 (mL/min)    GFR calc Af Amer 64 (*) >90 (mL/min)   CBC     Status: Abnormal   Collection Time   02/12/12  3:10 AM      Component Value Range Comment   WBC 8.2  4.0 - 10.5 (K/uL)    RBC 3.43 (*) 4.22 - 5.81 (MIL/uL)    Hemoglobin 11.0 (*) 13.0 - 17.0 (g/dL)    HCT 04.5 (*) 40.9 - 52.0 (%)    MCV 97.4  78.0 - 100.0 (fL)    MCH 32.1  26.0 - 34.0 (pg)    MCHC 32.9  30.0 - 36.0 (g/dL)    RDW 81.1  91.4 - 78.2 (%)    Platelets 121 (*) 150 - 400 (K/uL)   PROTIME-INR     Status: Abnormal   Collection Time   02/12/12  3:10 AM      Component Value Range Comment   Prothrombin Time 16.5 (*) 11.6 - 15.2 (seconds)    INR 1.31  0.00 - 1.49      Dg Chest Portable 1 View  02/11/2012  *RADIOLOGY  REPORT*  Clinical Data: GI bleeding; history of diabetes and hypertension.  PORTABLE CHEST - 1 VIEW  Comparison: None.  Findings: The lungs are well-aerated and clear.  There is no evidence of focal opacification, pleural effusion or pneumothorax.  The cardiomediastinal silhouette is within normal limits.  No acute osseous abnormalities are seen.  IMPRESSION: No acute cardiopulmonary process seen.  Original Report Authenticated By: Tonia Ghent, M.D.    ROS he was a little dizzy when the bleeding started but feeling fine now Blood pressure 130/80, pulse 67, temperature 98.1 F (36.7 C), temperature source Oral, resp. rate 16, height 5' 11.65" (1.82 m), weight 103.3 kg (227 lb 11.8 oz), SpO2 99.00%. Physical Exam vital signs stable afebrile no acute distress lungs clear heart regular rate and rhythm abdomen soft nontender good bowel sound labs were reviewed and office chart reviewed  as well as hospital computer  Assessment/Plan: GI bleeding probably lower in a patient on an aspirin a day status post kidney transplant Plan: The risks benefits methods of repeat colonoscopy and if nondiagnostic upper endoscopy was discussed with the patient and his wife and will proceed tomorrow at 12:15 with further workup and plans pending those findings and the prep was discussed and call us back sooner if signs of recurrent bleeding  James Zimmerman 02/12/2012, 11:10 AM

## 2012-02-13 ENCOUNTER — Encounter (HOSPITAL_COMMUNITY): Payer: Self-pay | Admitting: Anesthesiology

## 2012-02-13 ENCOUNTER — Inpatient Hospital Stay (HOSPITAL_COMMUNITY): Payer: Managed Care, Other (non HMO) | Admitting: Anesthesiology

## 2012-02-13 ENCOUNTER — Encounter (HOSPITAL_COMMUNITY)
Admission: EM | Disposition: A | Discharge status: Home / Self Care | Payer: Self-pay | Source: Home / Self Care | Attending: Internal Medicine

## 2012-02-13 ENCOUNTER — Encounter (HOSPITAL_COMMUNITY): Payer: Self-pay | Admitting: *Deleted

## 2012-02-13 ENCOUNTER — Other Ambulatory Visit: Payer: Self-pay | Admitting: Gastroenterology

## 2012-02-13 DIAGNOSIS — K921 Melena: Secondary | ICD-10-CM | POA: Diagnosis not present

## 2012-02-13 DIAGNOSIS — K5730 Diverticulosis of large intestine without perforation or abscess without bleeding: Secondary | ICD-10-CM | POA: Diagnosis not present

## 2012-02-13 DIAGNOSIS — D500 Iron deficiency anemia secondary to blood loss (chronic): Secondary | ICD-10-CM | POA: Diagnosis not present

## 2012-02-13 DIAGNOSIS — K319 Disease of stomach and duodenum, unspecified: Secondary | ICD-10-CM | POA: Diagnosis not present

## 2012-02-13 DIAGNOSIS — K5732 Diverticulitis of large intestine without perforation or abscess without bleeding: Secondary | ICD-10-CM | POA: Diagnosis not present

## 2012-02-13 DIAGNOSIS — D131 Benign neoplasm of stomach: Secondary | ICD-10-CM | POA: Diagnosis not present

## 2012-02-13 DIAGNOSIS — K449 Diaphragmatic hernia without obstruction or gangrene: Secondary | ICD-10-CM | POA: Diagnosis not present

## 2012-02-13 DIAGNOSIS — D126 Benign neoplasm of colon, unspecified: Secondary | ICD-10-CM | POA: Diagnosis not present

## 2012-02-13 LAB — GLUCOSE, CAPILLARY
Glucose-Capillary: 148 mg/dL — ABNORMAL HIGH (ref 70–99)
Glucose-Capillary: 148 mg/dL — ABNORMAL HIGH (ref 70–99)
Glucose-Capillary: 119 mg/dL — ABNORMAL HIGH (ref 70–99)

## 2012-02-13 LAB — HEMOGLOBIN AND HEMATOCRIT, BLOOD: HCT: 34.1 % — ABNORMAL LOW (ref 39.0–52.0)

## 2012-02-13 SURGERY — COLONOSCOPY WITH PROPOFOL
Anesthesia: Monitor Anesthesia Care

## 2012-02-13 MED ORDER — MYCOPHENOLATE MOFETIL 250 MG PO CAPS
250.0000 mg | ORAL_CAPSULE | Freq: Two times a day (BID) | ORAL | Status: DC
  Administered 2012-02-13 – 2012-02-14 (×3): 250 mg via ORAL
  Filled 2012-02-13 (×4): qty 1

## 2012-02-13 MED ORDER — TACROLIMUS 1 MG PO CAPS
2.0000 mg | ORAL_CAPSULE | Freq: Every day | ORAL | Status: DC
  Administered 2012-02-13: 2 mg via ORAL

## 2012-02-13 MED ORDER — LIDOCAINE HCL (CARDIAC) 20 MG/ML IV SOLN
INTRAVENOUS | Status: DC | PRN
  Administered 2012-02-13: 40 mg via INTRAVENOUS

## 2012-02-13 MED ORDER — PANTOPRAZOLE SODIUM 40 MG PO TBEC
40.0000 mg | DELAYED_RELEASE_TABLET | Freq: Two times a day (BID) | ORAL | Status: DC
  Administered 2012-02-13 – 2012-02-14 (×2): 40 mg via ORAL
  Filled 2012-02-13 (×5): qty 1

## 2012-02-13 MED ORDER — FENTANYL CITRATE 0.05 MG/ML IJ SOLN
INTRAMUSCULAR | Status: DC | PRN
  Administered 2012-02-13: 50 ug via INTRAVENOUS

## 2012-02-13 MED ORDER — TACROLIMUS 1 MG PO CAPS
2.0000 mg | ORAL_CAPSULE | Freq: Two times a day (BID) | ORAL | Status: DC
  Administered 2012-02-13 – 2012-02-14 (×2): 2 mg via ORAL
  Filled 2012-02-13 (×3): qty 2

## 2012-02-13 MED ORDER — PROPOFOL 10 MG/ML IV EMUL
INTRAVENOUS | Status: DC | PRN
  Administered 2012-02-13: 100 ug/kg/min via INTRAVENOUS

## 2012-02-13 MED ORDER — MIDAZOLAM HCL 5 MG/5ML IJ SOLN
INTRAMUSCULAR | Status: DC | PRN
  Administered 2012-02-13: 2 mg via INTRAVENOUS

## 2012-02-13 MED ORDER — TACROLIMUS 1 MG PO CAPS
1.0000 mg | ORAL_CAPSULE | Freq: Every day | ORAL | Status: DC
  Filled 2012-02-13: qty 1

## 2012-02-13 MED ORDER — PANTOPRAZOLE SODIUM 40 MG PO TBEC: 40.0000 mg | DELAYED_RELEASE_TABLET | Freq: Every day | ORAL | Status: DC

## 2012-02-13 MED ORDER — LACTATED RINGERS IV SOLN
INTRAVENOUS | Status: DC | PRN
  Administered 2012-02-13 (×3): via INTRAVENOUS

## 2012-02-13 MED ORDER — TACROLIMUS 1 MG PO CAPS: 2.0000 mg | ORAL_CAPSULE | Freq: Two times a day (BID) | ORAL | Status: DC

## 2012-02-13 SURGICAL SUPPLY — 24 items

## 2012-02-13 NOTE — Transfer of Care (Signed)
Immediate Anesthesia Transfer of Care Note  Patient: James Zimmerman  Procedure(s) Performed: Procedure(s) (LRB): COLONOSCOPY WITH PROPOFOL (N/A) ESOPHAGOGASTRODUODENOSCOPY (EGD) WITH PROPOFOL (N/A)  Patient Location: PACU  Anesthesia Type: MAC  Level of Consciousness: awake, alert , oriented and patient cooperative  Airway & Oxygen Therapy: Patient Spontanous Breathing and Patient connected to face mask oxygen  Post-op Assessment: Report given to PACU RN and Post -op Vital signs reviewed and stable  Post vital signs: stable  Complications: No apparent anesthesia complications 

## 2012-02-13 NOTE — Anesthesia Preprocedure Evaluation (Addendum)
Anesthesia Evaluation  Patient identified by MRN, date of birth, ID band Patient awake    Reviewed: Allergy & Precautions, H&P , NPO status , Patient's Chart, lab work & pertinent test results  Airway Mallampati: II TM Distance: >3 FB Neck ROM: full    Dental  (+) Chipped and Dental Advisory Given,    Pulmonary neg pulmonary ROS,  clear to auscultation  Pulmonary exam normal       Cardiovascular Exercise Tolerance: Good hypertension, Pt. on medications regular Normal    Neuro/Psych Negative Neurological ROS  Negative Psych ROS   GI/Hepatic negative GI ROS, Neg liver ROS, GERD-  Medicated and Controlled,  Endo/Other  Diabetes mellitus-, Well Controlled, Type 2, Oral Hypoglycemic Agents  Renal/GU negative Renal ROS  Genitourinary negative   Musculoskeletal   Abdominal   Peds  Hematology negative hematology ROS (+)   Anesthesia Other Findings   Reproductive/Obstetrics negative OB ROS                          Anesthesia Physical Anesthesia Plan  ASA: III  Anesthesia Plan: MAC   Post-op Pain Management:    Induction:   Airway Management Planned: Simple Face Mask  Additional Equipment:   Intra-op Plan:   Post-operative Plan:   Informed Consent: I have reviewed the patients History and Physical, chart, labs and discussed the procedure including the risks, benefits and alternatives for the proposed anesthesia with the patient or authorized representative who has indicated his/her understanding and acceptance.   Dental Advisory Given  Plan Discussed with: CRNA and Surgeon  Anesthesia Plan Comments:         Anesthesia Quick Evaluation

## 2012-02-13 NOTE — Progress Notes (Signed)
James Zimmerman 2:44 PM  Subjective: Patient is doing well without any signs of bleeding and no problems from his prep and no new complaints  Objective: Vital signs stable afebrile no acute distress abdomen is soft nontender good bowel other exam normal see pre-procedure physical exam   Assessment: GI bleeding probable lower  Plan: Okay to proceed with colonoscopy and if nondiagnostic endoscopy and the patient was reevaluated in preop area and agrees  [facility] E

## 2012-02-13 NOTE — Op Note (Signed)
[facility] [address] [place], [place]  [address]  ENDOSCOPY PROCEDURE REPORT  PATIENT:  James, Zimmerman  MR#:  [id number] BIRTHDATE:  12-13-1951, 60 yrs. old  GENDER:  male  ENDOSCOPIST:  Vida Rigger, MD Referred by:   hospital team  PROCEDURE DATE:  02/13/2012 PROCEDURE:  EGD with biopsy, 43239 ASA CLASS:  Class II INDICATIONS:  GI bleed nondiagnostic colonoscopy upper tract symptoms  MEDICATIONS:  250 mg Diprivan in addition to colonoscopy medicines TOPICAL ANESTHETIC: Used  DESCRIPTION OF PROCEDURE:   After the risks benefits and alternatives of the procedure were thoroughly explained, informed consent was obtained.  The Pentax Gastroscope [id number] endoscope was introduced through the mouth and advanced to the second portion of the duodenum, without limitations.  The instrument was slowly withdrawn as the mucosa was fully examined. The findings are documented below the patient tolerated the procedure fairly well there was no signs of complication and no signs of bleeding<<PROCEDUREIMAGES>>  FINDINGS: 1. No signs of bleeding 2. Small hiatal hernia 3. 2 small antral polyps biopsied 4. Otherwise within normal limits to the second portion of the duodenum  COMPLICATIONS:  None  ENDOSCOPIC IMPRESSION:  Above  RECOMMENDATIONS: Await pathology slowly advance diet if no further bleeding hopefully he can go home soon and we'll need to decide whether he truly needs aspirin or when to restart it and I will see him back in a few weeks to recheck symptoms make sure no further workup and plans are needed and decide the timing of repeat colonoscopy  REPEAT EXAM:  As needed  ______________________________ Vida Rigger, MD  CC:  n. eSIGNEDVida Rigger at 02/13/2012 04:04 PM  Simmie Davies, [phone]

## 2012-02-13 NOTE — Op Note (Signed)
[facility] [address] [place], [place]  [address]  COLONOSCOPY PROCEDURE REPORT  PATIENT:  James, Zimmerman  MR#:  [id number] BIRTHDATE:  05-12-1951, 60 yrs. old  GENDER:  male ENDOSCOPIST:  Vida Rigger, MD REF. BY:   hospital team PROCEDURE DATE:  02/13/2012 PROCEDURE:  Colonoscopy with biopsy ASA CLASS:  Class II INDICATIONS:  GI bleed sounds lower MEDICATIONS:  Per anesthesia 450 mg propofol 2 mg Versed 50 mcg fentanyl DESCRIPTION OF PROCEDURE:   After the risks benefits and alternatives of the procedure were thoroughly explained, informed consent was obtained.  The [id number] ([id number]) endoscope was introduced through the anus and advanced to the terminal ileum which was intubated for a short distance, without limitations. The quality of the prep was fair..  The instrument was then slowly withdrawn as the colon was fully examined. To advanced to the terminal ileum required abdominal pressure but no position changes and lots of washing and suctioning was done in an attempt to get adequate visualization. Medium-size semi-sessile cecal polyp was seen and we elected to take cold biopsies only because of his recent bleeding. The remainder of the findings are escribed below and there was no signs of any bleeding nor any blood in the terminal ileum as well. The patient tolerated the procedure adequately there was no obvious immediate complication <<PROCEDUREIMAGES>>  FINDINGS: 1. No signs of bleeding 2. Tiny internal/external hemorrhoid 3. Few sigmoid and ascending diverticula 4. Small distal transverse polyp cold biopsied 5. Medium semi-sessile cecal polyp cold biopsied 6. Otherwise within normal limits to the terminal ileum  COMPLICATIONS:  None IMPRESSION: Above  RECOMMENDATIONS: Await pathology Will need repeat colonoscopy with better prep and hopefully holding aspirin to proceed with more aggressive polypectomy in the cecum however elected not to  be aggressive at this time with bleeding yesterday and will continue workup with an endoscopy just to be sure  ______________________________ Vida Rigger, MD  CC:  n. eSIGNEDVida Rigger at 02/13/2012 03:56 PM  Simmie Davies, [phone]

## 2012-02-13 NOTE — Progress Notes (Signed)
Patient ID: James Zimmerman    HYW:[id number]    DOB: [birth year]    DOA: 02/11/2012  PCP: Lenora Boys, MD, MD  Subjective: EGD, colonoscopy today  Objective: Weight change:   Intake/Output Summary (Last 24 hours) at 02/13/12 1643 Last data filed at 02/13/12 1606  Gross per 24 hour  Intake   1700 ml  Output      1 ml  Net   1699 ml   Blood pressure 127/77, pulse 68, temperature 97.3 F (36.3 C), temperature source Oral, resp. rate 17, height 5' 11.65" (1.82 m), weight 103.3 kg (227 lb 11.8 oz), SpO2 100.00%.  Physical Exam: General: Alert and awake, oriented x3, not in any acute distress. HEENT: anicteric sclera, pupils reactive to light and accommodation, EOMI CVS: S1-S2 clear, no murmur rubs or gallops Chest: clear to auscultation bilaterally, no wheezing, rales or rhonchi Abdomen: soft nontender, nondistended, normal bowel sounds, no organomegaly Extremities: no cyanosis, clubbing or edema noted bilaterally Neuro: Cranial nerves II-XII intact, no focal neurological deficits  Lab Results: Basic Metabolic Panel:  Lab 02/12/12 4854 02/12/12 0310  NA 138 138  K 4.2 3.9  CL 109 108  CO2 22 22  GLUCOSE 153* 147*  BUN 19 24*  CREATININE 1.32 1.34  CALCIUM 8.1* 7.9*  MG -- --  PHOS -- --   Liver Function Tests:  Lab 02/11/12 1943  AST 14  ALT 15  ALKPHOS 71  BILITOT 0.5  PROT 6.3  ALBUMIN 3.4*   CBC:  Lab 02/13/12 1005 02/12/12 2246 02/12/12 0310 02/11/12 1943  WBC -- -- 8.2 11.9*  NEUTROABS -- -- -- 8.1*  HGB 11.4* 11.2* -- --  HCT 34.1* 33.4* -- --  MCV -- -- 97.4 96.9  PLT -- -- 121* 159   Studies/Results: Dg Chest Portable 1 View  02/11/2012  *RADIOLOGY REPORT*  Clinical Data: GI bleeding; history of diabetes and hypertension.  PORTABLE CHEST - 1 VIEW  Comparison: None.  Findings: The lungs are well-aerated and clear.  There is no evidence of focal opacification, pleural effusion or pneumothorax.  The cardiomediastinal silhouette is within normal  limits.  No acute osseous abnormalities are seen.  IMPRESSION: No acute cardiopulmonary process seen.  Original Report Authenticated By: Tonia Ghent, M.D.    Medications: Scheduled Meds:    . amLODipine  2.5 mg Oral Daily  . FLUoxetine  20 mg Oral Daily  . insulin aspart  0-5 Units Subcutaneous QHS  . insulin aspart  0-9 Units Subcutaneous TID WC  . metoprolol  50 mg Oral BID  . mycophenolate  250 mg Oral BID  . pantoprazole  40 mg Oral BID AC  . polyethylene glycol-electrolytes  4,000 mL Oral Once  . predniSONE  5 mg Oral Q breakfast  . simvastatin  5 mg Oral q1800  . tacrolimus  2 mg Oral BID  . DISCONTD: mycophenolate  250 mg Oral BID  . DISCONTD: pantoprazole (PROTONIX) IV  40 mg Intravenous Q12H  . DISCONTD: tacrolimus  1 mg Oral Q2000  . DISCONTD: tacrolimus  2 mg Oral BID  . DISCONTD: tacrolimus  2 mg Oral BID  . DISCONTD: tacrolimus  2 mg Oral Q breakfast   Continuous Infusions:    . 0.9 % NaCl with KCl 20 mEq / L 100 mL/hr at 02/13/12 6270     Assessment/Plan: Principal Problem:  *GI bleed: - Status post EGD and colonoscopy today - EGD with no signs of bleeding, small hiatal hernia, 2 small antral polyps  biopsied otherwise normal to second portion of duodenum - Colonoscopy with no active bleeding, tiny internal/external hemorrhoids small distal transverse polyp, medium semi-sessile cecal polyp biopsied otherwise normal to terminal ileum - Discussed with Dr. Ewing Schlein (GI), recommended to observe overnight, start on clears now and mechanical soft diet tomorrow morning, H/H in AM  Active Problems:  H/O polycystic kidney disease: Continue prednisone, tacrolimus, CellCept   Diabetes mellitus - Continue sliding scale insulin   Hypertension: Stable, on Norvasc and Lopressor   Hyperlipidemia: On Zocor  DVT Prophylaxis: SCDs  Code Status: Full code  Disposition: See above, hopefully DC home in a.m. if new issues overnight   LOS: 2 days   Kyria Bumgardner  M.D. [facility] 02/13/2012, 4:43 PM Pager: [phone]

## 2012-02-13 NOTE — Transfer of Care (Signed)
Immediate Anesthesia Transfer of Care Note  Patient: James Zimmerman  Procedure(s) Performed: Procedure(s) (LRB): COLONOSCOPY WITH PROPOFOL (N/A) ESOPHAGOGASTRODUODENOSCOPY (EGD) WITH PROPOFOL (N/A)  Patient Location: PACU  Anesthesia Type: MAC  Level of Consciousness: awake, alert , oriented and patient cooperative  Airway & Oxygen Therapy: Patient Spontanous Breathing and Patient connected to face mask oxygen  Post-op Assessment: Report given to PACU RN and Post -op Vital signs reviewed and stable  Post vital signs: stable  Complications: No apparent anesthesia complications

## 2012-02-14 LAB — TACROLIMUS LEVEL

## 2012-02-14 LAB — GLUCOSE, CAPILLARY: Glucose-Capillary: 117 mg/dL — ABNORMAL HIGH (ref 70–99)

## 2012-02-14 MED ORDER — PANTOPRAZOLE SODIUM 40 MG PO TBEC: 40.0000 mg | DELAYED_RELEASE_TABLET | Freq: Two times a day (BID) | ORAL | Status: DC

## 2012-02-14 NOTE — Discharge Summary (Signed)
Physician Discharge Summary  Patient ID: James Zimmerman MRN: [id number] DOB/AGE: 62/26/1952 62 y.o.  Admit date: 02/11/2012 Discharge date: 02/14/2012  Primary Care Physician:  Lenora Boys, MD, MD  Discharge Diagnoses:    .GI bleed status post EGD and coloscopy on 02/13/2012 Diabetes mellitus History of polycystic kidney disease status post transplant Hypertension Hyperlipidemia  Consults: Gastroenterology, Dr. Ewing Schlein   Discharge Medications: Medication List  As of 02/14/2012  3:37 PM   TAKE these medications         ALPRAZolam 0.25 MG tablet   Commonly known as: XANAX   Take 0.25 mg by mouth at bedtime as needed. PAIN      amLODipine 2.5 MG tablet   Commonly known as: NORVASC   Take 2.5 mg by mouth daily.      cholecalciferol 1000 UNITS tablet   Commonly known as: VITAMIN D   Take 1,000 Units by mouth daily.      FLUoxetine 20 MG capsule   Commonly known as: PROZAC   Take 20 mg by mouth daily.      metoprolol 50 MG tablet   Commonly known as: LOPRESSOR   Take 50 mg by mouth 2 (two) times daily.      mycophenolate 250 MG capsule   Commonly known as: CELLCEPT   Take by mouth 2 (two) times daily.      pantoprazole 40 MG tablet   Commonly known as: PROTONIX   Take 1 tablet (40 mg total) by mouth 2 (two) times daily.      predniSONE 5 MG tablet   Commonly known as: DELTASONE   Take 5 mg by mouth daily.      simvastatin 5 MG tablet   Commonly known as: ZOCOR   Take 5 mg by mouth daily.      tacrolimus 1 MG capsule   Commonly known as: PROGRAF   Take 2 mg by mouth 2 (two) times daily. 2 tablets (1mg ) BID updated by pt today 2/19 and verified with community pharmacy      vitamin B-12 1000 MCG tablet   Commonly known as: CYANOCOBALAMIN   Take 1,000 mcg by mouth daily.             Brief H and P: For complete details please refer to admission H and P, but in brief patient is a 61 year old gentleman who on the day of admission at approximately 5 PM  started passing blood in the stool. The patient stated that the color ranged from red to maroon and pain in the right lower quadrant along with cramping. Patient was admitted for further workup of the GI bleed.  Hospital Course:  GI bleed: The patient was admitted to the medical service, serial H. and H. were obtained and remained stable. Patient did not require any blood transfusions. GI was consulted and underwent EGD and colonoscopy on 02/13/2012.  EGD  showed no signs of bleeding, small hiatal hernia, 2 small antral polyps biopsied otherwise normal to second portion of duodenum. Colonoscopy showed no active bleeding, tiny internal/external hemorrhoids small distal transverse polyp, medium semi-sessile cecal polyp biopsied otherwise normal to terminal ileum. Patient was observed overnight and started on clears and advanced to regular diet in the morning. Repeat H&H was stable hence patient was discharged on 02/14/2012. He will followup with Dr. Randa Evens in next 2 weeks. The office will call him for the biopsy results. Patient was also instructed to discontinue aspirin and discuss with gastroenterology when to start back.  H/O polycystic kidney disease: Continue prednisone, tacrolimus, CellCept   Diabetes mellitus: Inpatient, placed on sliding scale insulin   Day of Discharge BP 134/79  Pulse 74  Temp(Src) 98.2 F (36.8 C) (Oral)  Resp 20  Ht 5' 11.65" (1.82 m)  Wt 103.3 kg (227 lb 11.8 oz)  BMI 31.19 kg/m2  SpO2 97%  Physical Exam: General: Alert and awake oriented x3 not in any acute distress. HEENT: anicteric sclera, pupils reactive to light and accommodation CVS: S1-S2 clear no murmur rubs or gallops Chest: clear to auscultation bilaterally, no wheezing rales or rhonchi Abdomen: soft nontender, nondistended, normal bowel sounds, no organomegaly Extremities: no cyanosis, clubbing or edema noted bilaterally Neuro: Cranial nerves II-XII intact, no focal neurological deficits   The  results of significant diagnostics from this hospitalization (including imaging, microbiology, ancillary and laboratory) are listed below for reference.    LAB RESULTS: Basic Metabolic Panel:  Lab 02/12/12 1610 02/12/12 0310  NA 138 138  K 4.2 3.9  CL 109 108  CO2 22 22  GLUCOSE 153* 147*  BUN 19 24*  CREATININE 1.32 1.34  CALCIUM 8.1* 7.9*  MG -- --  PHOS -- --   Liver Function Tests:  Lab 02/11/12 1943  AST 14  ALT 15  ALKPHOS 71  BILITOT 0.5  PROT 6.3  ALBUMIN 3.4*   CBC:  Lab 02/14/12 0508 02/13/12 1005 02/12/12 0310 02/11/12 1943  WBC -- -- 8.2 11.9*  NEUTROABS -- -- -- 8.1*  HGB 11.3* 11.4* -- --  HCT 33.7* 34.1* -- --  MCV -- -- 97.4 --  PLT -- -- 121* 159   CBG:  Lab 02/14/12 0735 02/13/12 2143  GLUCAP 117* 111*    Significant Diagnostic Studies:  Dg Chest Portable 1 View  02/11/2012  *RADIOLOGY REPORT*  Clinical Data: GI bleeding; history of diabetes and hypertension.  PORTABLE CHEST - 1 VIEW  Comparison: None.  Findings: The lungs are well-aerated and clear.  There is no evidence of focal opacification, pleural effusion or pneumothorax.  The cardiomediastinal silhouette is within normal limits.  No acute osseous abnormalities are seen.  IMPRESSION: No acute cardiopulmonary process seen.  Original Report Authenticated By: Tonia Ghent, M.D.     Disposition and Follow-up: Discharge Orders    Future Orders Please Complete By Expires   Diet Carb Modified      Increase activity slowly          DISPOSITION: Home  DIET: Carb modified diet  ACTIVITY: As tolerated   DISCHARGE FOLLOW-UP Follow-up Information    Follow up with FRIED, ROBERT L, MD. Schedule an appointment as soon as possible for a visit in 2 weeks.      Follow up with EDWARDS JR,JAMES L, MD. Schedule an appointment as soon as possible for a visit in 10 days.   Contact information:   [address] [employer], [place]. [place]  [address] [phone]          Time spent on Discharge: 45 mins  Signed:  Bacilio Abascal M.D. [facility] 02/14/2012, 3:37 PM

## 2012-02-14 NOTE — Anesthesia Postprocedure Evaluation (Signed)
  Anesthesia Post-op Note  Patient: James Zimmerman  Procedure(s) Performed: Procedure(s) (LRB): COLONOSCOPY WITH PROPOFOL (N/A) ESOPHAGOGASTRODUODENOSCOPY (EGD) WITH PROPOFOL (N/A)  Patient Location: PACU  Anesthesia Type: MAC  Level of Consciousness: awake and alert   Airway and Oxygen Therapy: Patient Spontanous Breathing  Post-op Pain: mild  Post-op Assessment: Post-op Vital signs reviewed, Patient's Cardiovascular Status Stable, Respiratory Function Stable, Patent Airway and No signs of Nausea or vomiting  Post-op Vital Signs: stable  Complications: No apparent anesthesia complications

## 2012-02-15 DIAGNOSIS — H40019 Open angle with borderline findings, low risk, unspecified eye: Secondary | ICD-10-CM | POA: Diagnosis not present

## 2012-03-19 ENCOUNTER — Ambulatory Visit (HOSPITAL_COMMUNITY)
Admission: RE | Admit: 2012-03-19 | Discharge: 2012-03-19 | Disposition: A | Discharge status: Home Health | Payer: Managed Care, Other (non HMO) | Source: Ambulatory Visit | Attending: Gastroenterology | Admitting: Gastroenterology

## 2012-03-19 ENCOUNTER — Encounter (HOSPITAL_COMMUNITY): Payer: Self-pay | Admitting: *Deleted

## 2012-03-19 ENCOUNTER — Encounter (HOSPITAL_COMMUNITY)
Admission: RE | Disposition: A | Discharge status: Home Health | Payer: Self-pay | Source: Ambulatory Visit | Attending: Gastroenterology

## 2012-03-19 DIAGNOSIS — K648 Other hemorrhoids: Secondary | ICD-10-CM | POA: Diagnosis not present

## 2012-03-19 DIAGNOSIS — K5730 Diverticulosis of large intestine without perforation or abscess without bleeding: Secondary | ICD-10-CM | POA: Diagnosis not present

## 2012-03-19 DIAGNOSIS — D126 Benign neoplasm of colon, unspecified: Secondary | ICD-10-CM | POA: Diagnosis not present

## 2012-03-19 HISTORY — PX: COLONOSCOPY: SHX5424

## 2012-03-19 HISTORY — PX: HOT HEMOSTASIS: SHX5433

## 2012-03-19 LAB — GLUCOSE, CAPILLARY: Glucose-Capillary: 153 mg/dL — ABNORMAL HIGH (ref 70–99)

## 2012-03-19 SURGERY — COLONOSCOPY
Anesthesia: Moderate Sedation

## 2012-03-19 MED ORDER — SODIUM CHLORIDE 0.9 % IV SOLN
Freq: Once | INTRAVENOUS | Status: AC
  Administered 2012-03-19: 09:00:00 via INTRAVENOUS

## 2012-03-19 MED ORDER — MIDAZOLAM HCL 10 MG/2ML IJ SOLN
INTRAMUSCULAR | Status: AC
  Filled 2012-03-19: qty 2

## 2012-03-19 MED ORDER — FENTANYL CITRATE 0.05 MG/ML IJ SOLN
INTRAMUSCULAR | Status: AC
  Filled 2012-03-19: qty 2

## 2012-03-19 MED ORDER — FENTANYL CITRATE 0.05 MG/ML IJ SOLN
INTRAMUSCULAR | Status: DC | PRN
  Administered 2012-03-19 (×4): 25 ug via INTRAVENOUS

## 2012-03-19 MED ORDER — MIDAZOLAM HCL 5 MG/5ML IJ SOLN
INTRAMUSCULAR | Status: DC | PRN
  Administered 2012-03-19 (×5): 2 mg via INTRAVENOUS

## 2012-03-19 NOTE — H&P (Signed)
  H+P: H+P reviewed from office to be scanned in system  Medications/Allergies:  See nurses notes and scanned H+P  PE: See pre-op evaluation

## 2012-03-19 NOTE — Op Note (Signed)
James Zimmerman, James Zimmerman  COLONOSCOPY PROCEDURE REPORT  PATIENT:  James Zimmerman, James Zimmerman  MR#:  [id number] BIRTHDATE:  1951/04/22, 60 yrs. old  GENDER:  male ENDOSCOPIST:  Carman Ching REF. BY:  Marinda Elk, M.D. Primitivo Gauze, M.D. PROCEDURE DATE:  03/19/2012 PROCEDURE:  Colonoscopy with snare polypectomy ASA CLASS:  Class III INDICATIONS:  excision of known colonic polyps patient had acute lower GI bleed due to diverticulosis and had nonbleeding cecal polyp discovered at the time of colonoscopy. Due to the bleeding, the polyp was not removed at that time. MEDICATIONS:   Fentanyl 100 mcg IV, Versed 10 mg IV  DESCRIPTION OF PROCEDURE:   After the risks and benefits and of the procedure were explained, informed consent was obtained. Digital rectal exam was performed and revealed no abnormalities. The Pentax Colonoscope [id number] endoscope was introduced through the anus and advanced to the cecum.  The quality of the prep was fair..  The instrument was then slowly withdrawn as the colon was fully examined. <<PROCEDUREIMAGES>>  FINDINGS:  Diverticula were found. very mild with no signs of bleeding  A sessile polyp was found in the cecum. this was located across from the ileocecal valve was estimated to be 1-1/2 cm in diameter. Multiple pieces were removed. There were no complications. It was felt that all the polyp was successfully removed.  Internal Hemorrhoids were found in the rectum. Retroflexed views in the rectum revealed internal hemorrhoids. The scope was then withdrawn from the patient and the procedure completed.  COMPLICATIONS:  A complication of none occured on 03/19/2012 at. ENDOSCOPIC IMPRESSION: 1) Diverticula 2) Sessile polyp in the cecum 3) Internal hemorrhoids in the rectum RECOMMENDATIONS: 1) Await pathology results 2) Avoid NSAIDS  REPEAT EXAM:  No  ______________________________ Carman Ching  CC:   Primitivo Gauze, MDRobert Foy [place], MD  n. Rosalie DoctorCarman Ching at 03/19/2012 10:11 AM  Simmie Davies, [phone]

## 2012-03-19 NOTE — Discharge Instructions (Addendum)
No aspirin, ibuprofen or other NSAID medications for 5 days. Office will send note or call when pathology results are obtained. Colonoscopy will be repeated based on the pathology results.  Colonoscopy Care After Read the instructions outlined below and refer to this sheet in the next few weeks. These discharge instructions provide you with general information on caring for yourself after you leave the hospital. Your doctor may also give you specific instructions. While your treatment has been planned according to the most current medical practices available, unavoidable complications occasionally occur. If you have any problems or questions after discharge, call your doctor. HOME CARE INSTRUCTIONS ACTIVITY:  You may resume your regular activity, but move at a slower pace for the next 24 hours.   Take frequent rest periods for the next 24 hours.   Walking will help get rid of the air and reduce the bloated feeling in your belly (abdomen).   No driving for 24 hours (because of the medicine (anesthesia) used during the test).   You may shower.   Do not sign any important legal documents or operate any machinery for 24 hours (because of the anesthesia used during the test).  NUTRITION:  Drink plenty of fluids.   You may resume your normal diet as instructed by your doctor.   Begin with a light meal and progress to your normal diet. Heavy or fried foods are harder to digest and may make you feel sick to your stomach (nauseated).   Avoid alcoholic beverages for 24 hours or as instructed.  MEDICATIONS:  You may resume your normal medications unless your doctor tells you otherwise.  WHAT TO EXPECT TODAY:  Some feelings of bloating in the abdomen.   Passage of more gas than usual.   Spotting of blood in your stool or on the toilet paper.  IF YOU HAD POLYPS REMOVED DURING THE COLONOSCOPY:  No aspirin products for 7 days or as instructed.   No alcohol for 7 days or as instructed.     Eat a soft diet for the next 24 hours.  FINDING OUT THE RESULTS OF YOUR TEST Not all test results are available during your visit. If your test results are not back during the visit, make an appointment with your caregiver to find out the results. Do not assume everything is normal if you have not heard from your caregiver or the medical facility. It is important for you to follow up on all of your test results.  SEEK IMMEDIATE MEDICAL CARE IF:  You have more than a spotting of blood in your stool.   Your belly is swollen (abdominal distention).   You are nauseated or vomiting.   You have a fever.   You have abdominal pain or discomfort that is severe or gets worse throughout the day.   Moderate Sedation, Adult Moderate sedation is given to help you relax or even sleep through a procedure. You may remain sleepy, be clumsy, or have poor balance for several hours following this procedure. Arrange for a responsible adult, family member, or friend to take you home. A responsible adult should stay with you for at least 24 hours or until the medicines have worn off.  Do not participate in any activities where you could become injured for the next 24 hours, or until you feel normal again. Do not:   Drive.   Swim.   Ride a bicycle.   Operate heavy machinery.   Cook.   Use power tools.   Climb ladders.  Work at [employer].   Do not make important decisions or sign legal documents until you are improved.   Vomiting may occur if you eat too soon. When you can drink without vomiting, try water, juice, or soup. Try solid foods if you feel little or no nausea.   Only take over-the-counter or prescription medications for pain, discomfort, or fever as directed by your caregiver.If pain medications have been prescribed for you, ask your caregiver how soon it is safe to take them.   Make sure you and your family fully understands everything about the medication given to you. Make sure you  understand what side effects may occur.   You should not drink alcohol, take sleeping pills, or medications that cause drowsiness for at least 24 hours.   If you smoke, do not smoke alone.   If you are feeling better, you may resume normal activities 24 hours after receiving sedation.   Keep all appointments as scheduled. Follow all instructions.   Ask questions if you do not understand.  SEEK MEDICAL CARE IF:   Your skin is pale or bluish in color.   You continue to feel sick to your stomach (nauseous) or throw up (vomit).   Your pain is getting worse and not helped by medication.   You have bleeding or swelling.   You are still sleepy or feeling clumsy after 24 hours.  SEEK IMMEDIATE MEDICAL CARE IF:   You develop a rash.   You have difficulty breathing.   You develop any type of allergic problem.   You have a fever.  Document Released: 09/05/2001 Document Revised: 11/30/2011 Document Reviewed: 01/27/2008 [facility] Patient Information 2012 [place], [place].

## 2012-03-20 ENCOUNTER — Encounter (HOSPITAL_COMMUNITY): Payer: Self-pay | Admitting: Gastroenterology

## 2012-04-01 ENCOUNTER — Ambulatory Visit
Admission: RE | Admit: 2012-04-01 | Discharge: 2012-04-01 | Disposition: A | Discharge status: Home / Self Care | Payer: Managed Care, Other (non HMO) | Source: Ambulatory Visit | Attending: Nephrology | Admitting: Nephrology

## 2012-04-01 ENCOUNTER — Other Ambulatory Visit: Payer: Self-pay | Admitting: Nephrology

## 2012-04-01 DIAGNOSIS — M25559 Pain in unspecified hip: Secondary | ICD-10-CM

## 2012-04-01 DIAGNOSIS — M25551 Pain in right hip: Secondary | ICD-10-CM

## 2012-07-27 ENCOUNTER — Encounter (HOSPITAL_COMMUNITY): Payer: Self-pay | Admitting: Emergency Medicine

## 2012-07-27 ENCOUNTER — Emergency Department (HOSPITAL_COMMUNITY): Payer: 59

## 2012-07-27 ENCOUNTER — Emergency Department (HOSPITAL_COMMUNITY)
Admission: EM | Admit: 2012-07-27 | Discharge: 2012-07-27 | Disposition: A | Discharge status: Home / Self Care | Payer: 59 | Attending: Emergency Medicine | Admitting: Emergency Medicine

## 2012-07-27 DIAGNOSIS — I129 Hypertensive chronic kidney disease with stage 1 through stage 4 chronic kidney disease, or unspecified chronic kidney disease: Secondary | ICD-10-CM | POA: Insufficient documentation

## 2012-07-27 DIAGNOSIS — E119 Type 2 diabetes mellitus without complications: Secondary | ICD-10-CM | POA: Insufficient documentation

## 2012-07-27 DIAGNOSIS — B9789 Other viral agents as the cause of diseases classified elsewhere: Secondary | ICD-10-CM | POA: Diagnosis not present

## 2012-07-27 DIAGNOSIS — R509 Fever, unspecified: Secondary | ICD-10-CM | POA: Diagnosis not present

## 2012-07-27 DIAGNOSIS — K219 Gastro-esophageal reflux disease without esophagitis: Secondary | ICD-10-CM | POA: Diagnosis not present

## 2012-07-27 DIAGNOSIS — Z940 Kidney transplant status: Secondary | ICD-10-CM | POA: Insufficient documentation

## 2012-07-27 DIAGNOSIS — N189 Chronic kidney disease, unspecified: Secondary | ICD-10-CM | POA: Insufficient documentation

## 2012-07-27 DIAGNOSIS — E785 Hyperlipidemia, unspecified: Secondary | ICD-10-CM | POA: Insufficient documentation

## 2012-07-27 DIAGNOSIS — B349 Viral infection, unspecified: Secondary | ICD-10-CM

## 2012-07-27 LAB — BASIC METABOLIC PANEL
BUN: 20 mg/dL (ref 6–23)
Chloride: 98 mEq/L (ref 96–112)
Glucose, Bld: 198 mg/dL — ABNORMAL HIGH (ref 70–99)
Potassium: 3.9 mEq/L (ref 3.5–5.1)

## 2012-07-27 LAB — URINALYSIS, ROUTINE W REFLEX MICROSCOPIC
Hgb urine dipstick: NEGATIVE
Leukocytes, UA: NEGATIVE
Protein, ur: 30 mg/dL — AB
Urobilinogen, UA: 1 mg/dL (ref 0.0–1.0)

## 2012-07-27 LAB — CBC
HCT: 45.5 % (ref 39.0–52.0)
Hemoglobin: 15.5 g/dL (ref 13.0–17.0)
WBC: 8.4 10*3/uL (ref 4.0–10.5)

## 2012-07-27 LAB — URINE MICROSCOPIC-ADD ON

## 2012-07-27 MED ORDER — SODIUM CHLORIDE 0.9 % IV BOLUS (SEPSIS)
1000.0000 mL | Freq: Once | INTRAVENOUS | Status: AC
  Administered 2012-07-27: 1000 mL via INTRAVENOUS

## 2012-07-27 NOTE — ED Notes (Addendum)
Fever since Thursday night- hx of kidney transplant 01/26/03 -- denies nausea/vomiting/diarrhea-  Urinary frequency and urgency lately- denies dysuria. Headache with fever,  found two ticks on himself -- not embedded, easily removed,  Slight sore throat.   Tylenol 1G at 7am, Advil 200mg  at 12:00

## 2012-07-27 NOTE — ED Provider Notes (Signed)
History     CSN: [id number]  Arrival date & time 07/27/12  1403   First MD Initiated Contact with Patient 07/27/12 1426      Chief Complaint  Patient presents with  . Fever    HPI The patient reports approximately 2 and half days of fever.  He denies cough or congestion.  Denies chest pain.  He has had mild nausea without vomiting.  He denies diarrhea.  No melena or hematochezia.  He is a renal transplant patient and is currently compliant with his medications.  His had no tenderness over his donor kidney in his left lower quadrant.  He has no dysuria or urinary frequency.  His had no recent sick contacts.  He has had mild sore throat without difficulty swallowing.  No dental pain.  Mild headache at times.  No headache currently.  The symptoms are mild to moderate in severity.  Past Medical History  Diagnosis Date  . Diabetes mellitus   . H/O polycystic kidney disease   . Hypertension   . Hyperlipidemia   . GERD (gastroesophageal reflux disease)   . Anxiety     crowds, closed in spaces, used to sleep, prn  . Chronic kidney disease     polycystic kidney dz.     Past Surgical History  Procedure Date  . Kidney transplant   . Left kidney transplant 2004  . Colonoscopy   . Colonoscopy 03/19/2012    Procedure: COLONOSCOPY;  Surgeon: Vertell Novak., MD;  Location: [facility] ENDOSCOPY;  Service: Endoscopy;  Laterality: N/A;  . Hot hemostasis 03/19/2012    Procedure: HOT HEMOSTASIS (ARGON PLASMA COAGULATION/BICAP);  Surgeon: Vertell Novak., MD;  Location: [facility] ENDOSCOPY;  Service: Endoscopy;  Laterality: N/A;    Family History  Problem Relation Age of Onset  . Polycystic kidney disease    . Anesthesia problems Neg Hx   . Hypotension Neg Hx   . Malignant hyperthermia Neg Hx   . Pseudochol deficiency Neg Hx     History  Substance Use Topics  . Smoking status: Former Games developer  . Smokeless tobacco: Not on file  . Alcohol Use: 0.0 oz/week    3-4 Glasses of wine per week       Review of Systems  All other systems reviewed and are negative.    Allergies  Penicillins  Home Medications   Current Outpatient Rx  Name Route Sig Dispense Refill  . ACETAMINOPHEN 500 MG PO TABS Oral Take 1,000 mg by mouth every 6 (six) hours as needed. For pain.    Marland Kitchen ALLOPURINOL 100 MG PO TABS Oral Take 200 mg by mouth daily.    Marland Kitchen ALPRAZOLAM 0.25 MG PO TABS Oral Take 0.25 mg by mouth at bedtime as needed. PAIN    . AMLODIPINE BESYLATE 2.5 MG PO TABS Oral Take 2.5 mg by mouth daily.    Marland Kitchen VITAMIN D 1000 UNITS PO TABS Oral Take 1,000 Units by mouth daily.    Marland Kitchen EZETIMIBE 10 MG PO TABS Oral Take 10 mg by mouth daily.    Marland Kitchen FLUOXETINE HCL 20 MG PO CAPS Oral Take 20 mg by mouth daily.    . IBUPROFEN 200 MG PO TABS Oral Take 200 mg by mouth every 6 (six) hours as needed. For pain.    Marland Kitchen METOPROLOL TARTRATE 50 MG PO TABS Oral Take 50 mg by mouth 2 (two) times daily.    Marland Kitchen MYCOPHENOLATE MOFETIL 250 MG PO CAPS Oral Take 250 mg by mouth 2 (  two) times daily.     Marland Kitchen PANTOPRAZOLE SODIUM 40 MG PO TBEC Oral Take 1 tablet (40 mg total) by mouth 2 (two) times daily. 60 tablet 3  . PIOGLITAZONE HCL 30 MG PO TABS Oral Take 30 mg by mouth daily.    Marland Kitchen PREDNISONE 5 MG PO TABS Oral Take 5 mg by mouth daily.    Marland Kitchen TACROLIMUS 1 MG PO CAPS Oral Take 2 mg by mouth 2 (two) times daily.     Marland Kitchen VITAMIN B-12 1000 MCG PO TABS Oral Take 1,000 mcg by mouth daily.      BP 141/81  Pulse 88  Temp 99.3 F (37.4 C) (Oral)  Resp 16  SpO2 100%  Physical Exam  Nursing note and vitals reviewed. Constitutional: He is oriented to person, place, and time. He appears well-developed and well-nourished.  HENT:  Head: Normocephalic and atraumatic.       Posterior pharynx is normal.  Uvula is midline.  No evidence of peritonsillar abscess.  Tolerating secretions.  Dentition normal the  Eyes: EOM are normal.  Neck: Normal range of motion.  Cardiovascular: Normal rate, regular rhythm, normal heart sounds and intact  distal pulses.   Pulmonary/Chest: Effort normal and breath sounds normal. No respiratory distress.  Abdominal: Soft. He exhibits no distension. There is no tenderness.       No tenderness in left lower quadrant over donor kidney  Musculoskeletal: Normal range of motion.  Neurological: He is alert and oriented to person, place, and time.  Skin: Skin is warm and dry.  Psychiatric: He has a normal mood and affect. Judgment normal.    ED Course  Procedures (including critical care time)  Labs Reviewed  CBC - Abnormal; Notable for the following:    Platelets 109 (*)     All other components within normal limits  BASIC METABOLIC PANEL - Abnormal; Notable for the following:    Sodium 132 (*)     Glucose, Bld 198 (*)     Creatinine, Ser 1.58 (*)     GFR calc non Af Amer 45 (*)     GFR calc Af Amer 52 (*)     All other components within normal limits  URINALYSIS, ROUTINE W REFLEX MICROSCOPIC - Abnormal; Notable for the following:    Color, Urine AMBER (*)  BIOCHEMICALS MAY BE AFFECTED BY COLOR   Protein, ur 30 (*)     All other components within normal limits  URINE MICROSCOPIC-ADD ON  CULTURE, BLOOD (ROUTINE X 2)  CULTURE, BLOOD (ROUTINE X 2)  URINE CULTURE   Dg Chest 2 View  07/27/2012  *RADIOLOGY REPORT*  Clinical Data: Fever for 3 days  CHEST - 2 VIEW  Comparison: 02/11/2012  Findings: The heart size and mediastinal contours are within normal limits.  Both lungs are clear.  The visualized skeletal structures are unremarkable.  IMPRESSION: Negative examination.  Original Report Authenticated By: Rosealee Albee, M.D.    I personally reviewed the imaging tests through PACS system  I reviewed available ER/hospitalization records thought the EMR    1. Fever   2. Viral syndrome       MDM  The patient is well-appearing.  His last creatinine in the office was 1.7 therefore his creatinine 1.58 today is improved.        Lyanne Co, MD 07/27/12 [phone]

## 2012-07-28 LAB — URINE CULTURE
Colony Count: NO GROWTH
Culture: NO GROWTH

## 2012-08-02 LAB — CULTURE, BLOOD (ROUTINE X 2): Culture: NO GROWTH

## 2012-09-18 DIAGNOSIS — N186 End stage renal disease: Secondary | ICD-10-CM | POA: Insufficient documentation

## 2012-12-02 ENCOUNTER — Encounter (HOSPITAL_COMMUNITY): Payer: Self-pay | Admitting: Pharmacy Technician

## 2012-12-03 NOTE — Patient Instructions (Addendum)
[address]  12/03/2012   Your procedure is scheduled on:  12/10/12   Report to [facility] at 1130 AM.  Call this number if you have problems the morning of surgery: [phone]   Remember:   Do not eat food:After Midnight.  May have clear liquids:until Midnight .  Marland Kitchen  Take these medicines the morning of surgery with A SIP OF WATER   Do not wear jewelry,   Do not wear lotions, powders, or perfumes. .  . Men may shave face and neck.  Do not bring valuables to the hospital.  Contacts, dentures or bridgework may not be worn into surgery.  Leave suitcase in the car. After surgery it may be brought to your room.  For patients admitted to the hospital, checkout time is 11:00 AM the day of discharge.                SEE CHG INSTRUCTION SHEET    Please read over the following fact sheets that you were given: MRSA Information, coughing and deep breathing exercises, leg exercises, Incentive Spirometry Fact Sheet , Blood Transfusion Fact Sheet                 Failure to comply with these instructions may result in cancellation of your surgery.                Patient Signature ________________________________________________________               Nurse Signature ________________________________________________________

## 2012-12-04 ENCOUNTER — Encounter (HOSPITAL_COMMUNITY): Payer: Self-pay

## 2012-12-04 ENCOUNTER — Encounter (HOSPITAL_COMMUNITY)
Admission: RE | Admit: 2012-12-04 | Discharge: 2012-12-04 | Disposition: A | Discharge status: Home / Self Care | Payer: Managed Care, Other (non HMO) | Source: Ambulatory Visit | Attending: Orthopedic Surgery | Admitting: Orthopedic Surgery

## 2012-12-04 ENCOUNTER — Other Ambulatory Visit (HOSPITAL_COMMUNITY): Payer: Managed Care, Other (non HMO)

## 2012-12-04 HISTORY — DX: Diverticulitis of intestine, part unspecified, without perforation or abscess without bleeding: K57.92

## 2012-12-04 LAB — BASIC METABOLIC PANEL
CO2: 22 mEq/L (ref 19–32)
Calcium: 9.5 mg/dL (ref 8.4–10.5)
Creatinine, Ser: 1.35 mg/dL (ref 0.50–1.35)

## 2012-12-04 LAB — URINALYSIS, ROUTINE W REFLEX MICROSCOPIC
Bilirubin Urine: NEGATIVE
Glucose, UA: NEGATIVE mg/dL
Hgb urine dipstick: NEGATIVE
Ketones, ur: NEGATIVE mg/dL
Protein, ur: 30 mg/dL — AB

## 2012-12-04 LAB — URINE MICROSCOPIC-ADD ON

## 2012-12-04 LAB — CBC
MCH: 32.6 pg (ref 26.0–34.0)
MCV: 94.7 fL (ref 78.0–100.0)
Platelets: 138 10*3/uL — ABNORMAL LOW (ref 150–400)
RDW: 14.1 % (ref 11.5–15.5)
WBC: 6.4 10*3/uL (ref 4.0–10.5)

## 2012-12-04 LAB — APTT: aPTT: 37 seconds (ref 24–37)

## 2012-12-04 NOTE — Progress Notes (Signed)
12/04/12 0915  OBSTRUCTIVE SLEEP APNEA  Have you ever been diagnosed with sleep apnea through a sleep study? No  Do you snore loudly (loud enough to be heard through closed doors)?  1  Do you often feel tired, fatigued, or sleepy during the daytime? 1  Has anyone observed you stop breathing during your sleep? 0  Do you have, or are you being treated for high blood pressure? 1  BMI more than 35 kg/m2? 0  Age over 62 years old? 1  Neck circumference greater than 40 cm/18 inches? 0  Gender: 1  Obstructive Sleep Apnea Score 5

## 2012-12-04 NOTE — Progress Notes (Signed)
Last office visit note from [facility]- Dr Briant Cedar - 06/28/12 on chart  Last office visit note from [facility]- Dr Jamelle Haring- 09/26/12 on chart

## 2012-12-04 NOTE — H&P (Signed)
TOTAL HIP ADMISSION H&P  Patient is admitted for right total hip arthroplasty, anterior approach.  Subjective:  Chief Complaint:    Right hip OA / pain  HPI: James Zimmerman, 61 y.o. male, has a history of pain and functional disability in the right hip(s) due to arthritis and patient has failed non-surgical conservative treatments for greater than 12 weeks to include corticosteriod injections, use of assistive devices and activity modification.  Onset of symptoms was gradual starting 2 years ago with gradually worsening course since that time.The patient noted no past surgery on the right hip(s).  Patient currently rates pain in the right hip at 8 out of 10 with activity. Patient has worsening of pain with activity and weight bearing, trendelenberg gait, pain that interfers with activities of daily living and pain with passive range of motion. Patient has evidence of periarticular osteophytes and joint space narrowing by imaging studies. This condition presents safety issues increasing the risk of falls. There is no current active infection.  Risks, benefits and expectations were discussed with the patient. Patient understand the risks, benefits and expectations and wishes to proceed with surgery.   D/C Plans:  Home with HHPT  Post-op Meds:  No Rx given  Tranexamic Acid:  Not to be given - Skin CA  Decadron:   Not to be given - DM   Patient Active Problem List   Diagnosis Date Noted  . GI bleed 02/11/2012  . H/O polycystic kidney disease 02/11/2012  . Diabetes mellitus 02/11/2012  . Hypertension 02/11/2012  . Hyperlipidemia 02/11/2012   Past Medical History  Diagnosis Date  . Diabetes mellitus   . H/O polycystic kidney disease   . Hypertension   . Hyperlipidemia   . GERD (gastroesophageal reflux disease)   . Anxiety     crowds, closed in spaces, used to sleep, prn  . Chronic kidney disease     polycystic kidney dz.   . Pneumonia     hx of as a child   . Kidney transplant  as cause of abnormal reaction or later complication     2004   . Arthritis   . Cancer     hx of skin cancer   . Diverticulitis     Past Surgical History  Procedure Date  . Kidney transplant   . Left kidney transplant 2004  . Colonoscopy   . Colonoscopy 03/19/2012    Procedure: COLONOSCOPY;  Surgeon: Vertell Novak., MD;  Location: [facility] ENDOSCOPY;  Service: Endoscopy;  Laterality: N/A;  . Hot hemostasis 03/19/2012    Procedure: HOT HEMOSTASIS (ARGON PLASMA COAGULATION/BICAP);  Surgeon: Vertell Novak., MD;  Location: [facility] ENDOSCOPY;  Service: Endoscopy;  Laterality: N/A;    No prescriptions prior to admission   Allergies  Allergen Reactions  . Penicillins Rash    History  Substance Use Topics  . Smoking status: Former Smoker    Quit date: 12/26/1983  . Smokeless tobacco: Never Used  . Alcohol Use: 16.8 oz/week    28 Glasses of wine per week    Family History  Problem Relation Age of Onset  . Polycystic kidney disease    . Anesthesia problems Neg Hx   . Hypotension Neg Hx   . Malignant hyperthermia Neg Hx   . Pseudochol deficiency Neg Hx      Review of Systems  Constitutional: Positive for malaise/fatigue.  HENT: Negative.   Eyes: Negative.   Respiratory: Negative.   Cardiovascular: Negative.   Gastrointestinal: Positive for diarrhea.  Genitourinary: Positive for frequency.  Musculoskeletal: Positive for back pain and joint pain.  Skin: Negative.   Neurological: Negative.   Endo/Heme/Allergies: Negative.   Psychiatric/Behavioral: Negative.     Objective:  Physical Exam  Constitutional: He is oriented to person, place, and time. He appears well-developed and well-nourished.  HENT:  Head: Normocephalic and atraumatic.  Mouth/Throat: Oropharynx is clear and moist.  Eyes: Pupils are equal, round, and reactive to light.  Neck: Neck supple. No JVD present. No tracheal deviation present. No thyromegaly present.  Cardiovascular: Normal rate, regular rhythm,  normal heart sounds and intact distal pulses.   Respiratory: Effort normal and breath sounds normal. No respiratory distress. He has no wheezes.  GI: Soft. There is no tenderness. There is no guarding.  Musculoskeletal:       Right hip: He exhibits decreased range of motion, decreased strength, tenderness and bony tenderness. He exhibits no swelling, no deformity and no laceration.       Lumbar back: He exhibits decreased range of motion, tenderness, bony tenderness and pain.  Lymphadenopathy:    He has no cervical adenopathy.  Neurological: He is alert and oriented to person, place, and time.  Skin: Skin is warm and dry.  Psychiatric: He has a normal mood and affect.    Vital signs in last 24 hours: Temp:  [97.9 F (36.6 C)] 97.9 F (36.6 C) (12/11 0914) Pulse Rate:  [69] 69  (12/11 0914) Resp:  [16] 16  (12/11 0914) BP: (158)/(94) 158/94 mmHg (12/11 0914) SpO2:  [97 %] 97 % (12/11 0914) Weight:  [104.191 kg (229 lb 11.2 oz)] 104.191 kg (229 lb 11.2 oz) (12/11 0914)  Labs:   Estimated Body mass index is 29.84 kg/(m^2) as calculated from the following:   Height as of 03/19/12: 6\' 0" (1.829 m).   Weight as of 03/19/12: 220 lb(99.791 kg).   Imaging Review Plain radiographs demonstrate severe degenerative joint disease of the right hip(s). The bone quality appears to be good for age and reported activity level.  Assessment/Plan:  End stage arthritis, right hip(s)  The patient history, physical examination, clinical judgement of the provider and imaging studies are consistent with end stage degenerative joint disease of the right hip(s) and total hip arthroplasty is deemed medically necessary. The treatment options including medical management, injection therapy, arthroscopy and arthroplasty were discussed at length. The risks and benefits of total hip arthroplasty were presented and reviewed. The risks due to aseptic loosening, infection, stiffness, dislocation/subluxation,   thromboembolic complications and other imponderables were discussed.  The patient acknowledged the explanation, agreed to proceed with the plan and consent was signed. Patient is being admitted for inpatient treatment for surgery, pain control, PT, OT, prophylactic antibiotics, VTE prophylaxis, progressive ambulation and ADL's and discharge planning.The patient is planning to be discharged home with home health services.     Anastasio Auerbach Virgia Kelner   PAC  12/04/2012, 11:41 AM

## 2012-12-04 NOTE — Progress Notes (Signed)
Routed urinalysis and bmet results via EPIC to Dr Charlann Boxer.

## 2012-12-04 NOTE — Progress Notes (Signed)
Requested and placed on chart last office visit note from Dr Jamelle Haring - related to kidney transplant.

## 2012-12-09 MED ORDER — CLINDAMYCIN PHOSPHATE 900 MG/50ML IV SOLN
900.0000 mg | INTRAVENOUS | Status: AC
  Administered 2012-12-10: 900 mg via INTRAVENOUS
  Filled 2012-12-09: qty 50

## 2012-12-10 ENCOUNTER — Encounter (HOSPITAL_COMMUNITY): Payer: Self-pay | Admitting: *Deleted

## 2012-12-10 ENCOUNTER — Encounter (HOSPITAL_COMMUNITY)
Admission: RE | Disposition: A | Discharge status: Home Health | Payer: Self-pay | Source: Ambulatory Visit | Attending: Orthopedic Surgery

## 2012-12-10 ENCOUNTER — Encounter (HOSPITAL_COMMUNITY): Payer: Self-pay | Admitting: Anesthesiology

## 2012-12-10 ENCOUNTER — Encounter (HOSPITAL_COMMUNITY): Payer: Self-pay | Admitting: Surgery

## 2012-12-10 ENCOUNTER — Inpatient Hospital Stay (HOSPITAL_COMMUNITY): Payer: Managed Care, Other (non HMO)

## 2012-12-10 ENCOUNTER — Inpatient Hospital Stay (HOSPITAL_COMMUNITY)
Admission: RE | Admit: 2012-12-10 | Discharge: 2012-12-12 | DRG: 470 | Disposition: A | Discharge status: Home Health | Payer: Managed Care, Other (non HMO) | Source: Ambulatory Visit | Attending: Orthopedic Surgery | Admitting: Orthopedic Surgery

## 2012-12-10 ENCOUNTER — Inpatient Hospital Stay (HOSPITAL_COMMUNITY): Payer: Managed Care, Other (non HMO) | Admitting: Anesthesiology

## 2012-12-10 DIAGNOSIS — E785 Hyperlipidemia, unspecified: Secondary | ICD-10-CM | POA: Diagnosis present

## 2012-12-10 DIAGNOSIS — M1610 Unilateral primary osteoarthritis, unspecified hip: Secondary | ICD-10-CM | POA: Diagnosis not present

## 2012-12-10 DIAGNOSIS — E119 Type 2 diabetes mellitus without complications: Secondary | ICD-10-CM | POA: Diagnosis present

## 2012-12-10 DIAGNOSIS — E669 Obesity, unspecified: Secondary | ICD-10-CM | POA: Diagnosis present

## 2012-12-10 DIAGNOSIS — K219 Gastro-esophageal reflux disease without esophagitis: Secondary | ICD-10-CM | POA: Diagnosis present

## 2012-12-10 DIAGNOSIS — Z96649 Presence of unspecified artificial hip joint: Secondary | ICD-10-CM

## 2012-12-10 DIAGNOSIS — D62 Acute posthemorrhagic anemia: Secondary | ICD-10-CM | POA: Diagnosis not present

## 2012-12-10 DIAGNOSIS — Q613 Polycystic kidney, unspecified: Secondary | ICD-10-CM

## 2012-12-10 DIAGNOSIS — M169 Osteoarthritis of hip, unspecified: Secondary | ICD-10-CM | POA: Diagnosis not present

## 2012-12-10 DIAGNOSIS — D500 Iron deficiency anemia secondary to blood loss (chronic): Secondary | ICD-10-CM

## 2012-12-10 DIAGNOSIS — Z6831 Body mass index (BMI) 31.0-31.9, adult: Secondary | ICD-10-CM

## 2012-12-10 DIAGNOSIS — Z880 Allergy status to penicillin: Secondary | ICD-10-CM

## 2012-12-10 DIAGNOSIS — E871 Hypo-osmolality and hyponatremia: Secondary | ICD-10-CM | POA: Diagnosis not present

## 2012-12-10 DIAGNOSIS — Z940 Kidney transplant status: Secondary | ICD-10-CM | POA: Diagnosis not present

## 2012-12-10 DIAGNOSIS — I10 Essential (primary) hypertension: Secondary | ICD-10-CM | POA: Diagnosis present

## 2012-12-10 DIAGNOSIS — Z79899 Other long term (current) drug therapy: Secondary | ICD-10-CM

## 2012-12-10 DIAGNOSIS — Z8719 Personal history of other diseases of the digestive system: Secondary | ICD-10-CM

## 2012-12-10 HISTORY — PX: TOTAL HIP ARTHROPLASTY: SHX124

## 2012-12-10 LAB — GLUCOSE, CAPILLARY
Glucose-Capillary: 165 mg/dL — ABNORMAL HIGH (ref 70–99)
Glucose-Capillary: 262 mg/dL — ABNORMAL HIGH (ref 70–99)

## 2012-12-10 LAB — TYPE AND SCREEN: Antibody Screen: NEGATIVE

## 2012-12-10 SURGERY — ARTHROPLASTY, HIP, TOTAL, ANTERIOR APPROACH
Anesthesia: General | Site: Hip | Laterality: Right | Wound class: Clean

## 2012-12-10 MED ORDER — PROMETHAZINE HCL 25 MG/ML IJ SOLN: 6.2500 mg | INTRAMUSCULAR | Status: DC | PRN

## 2012-12-10 MED ORDER — PHENOL 1.4 % MT LIQD: 1.0000 | OROMUCOSAL | Status: DC | PRN

## 2012-12-10 MED ORDER — LACTATED RINGERS IV SOLN
INTRAVENOUS | Status: DC
  Administered 2012-12-10: 14:00:00 via INTRAVENOUS
  Administered 2012-12-10: 1000 mL via INTRAVENOUS
  Administered 2012-12-10: 16:00:00 via INTRAVENOUS

## 2012-12-10 MED ORDER — MYCOPHENOLATE MOFETIL 250 MG PO CAPS
250.0000 mg | ORAL_CAPSULE | Freq: Two times a day (BID) | ORAL | Status: DC
  Filled 2012-12-10: qty 1

## 2012-12-10 MED ORDER — BISACODYL 10 MG RE SUPP
10.0000 mg | Freq: Every day | RECTAL | Status: DC | PRN
Start: 2012-12-10 — End: 2012-12-12

## 2012-12-10 MED ORDER — LIDOCAINE HCL (CARDIAC) 20 MG/ML IV SOLN
INTRAVENOUS | Status: DC | PRN
  Administered 2012-12-10: 40 mg via INTRAVENOUS

## 2012-12-10 MED ORDER — PANTOPRAZOLE SODIUM 40 MG PO TBEC
40.0000 mg | DELAYED_RELEASE_TABLET | Freq: Two times a day (BID) | ORAL | Status: DC
  Administered 2012-12-10 – 2012-12-12 (×4): 40 mg via ORAL
  Filled 2012-12-10 (×6): qty 1

## 2012-12-10 MED ORDER — DIPHENHYDRAMINE HCL 25 MG PO CAPS
25.0000 mg | ORAL_CAPSULE | Freq: Four times a day (QID) | ORAL | Status: DC | PRN
  Administered 2012-12-11: 25 mg via ORAL
  Filled 2012-12-10: qty 1

## 2012-12-10 MED ORDER — EZETIMIBE 10 MG PO TABS
10.0000 mg | ORAL_TABLET | Freq: Every day | ORAL | Status: DC
  Administered 2012-12-11 – 2012-12-12 (×2): 10 mg via ORAL
  Filled 2012-12-10 (×3): qty 1

## 2012-12-10 MED ORDER — HETASTARCH-ELECTROLYTES 6 % IV SOLN
INTRAVENOUS | Status: DC | PRN
  Administered 2012-12-10: 15:00:00 via INTRAVENOUS

## 2012-12-10 MED ORDER — FLEET ENEMA 7-19 GM/118ML RE ENEM: 1.0000 | ENEMA | Freq: Once | RECTAL | Status: AC | PRN

## 2012-12-10 MED ORDER — PROPOFOL 10 MG/ML IV BOLUS
INTRAVENOUS | Status: DC | PRN
  Administered 2012-12-10: 200 mg via INTRAVENOUS
  Administered 2012-12-10: 50 mg via INTRAVENOUS

## 2012-12-10 MED ORDER — HYDROMORPHONE HCL PF 1 MG/ML IJ SOLN
INTRAMUSCULAR | Status: AC
  Filled 2012-12-10: qty 1

## 2012-12-10 MED ORDER — METOPROLOL TARTRATE 50 MG PO TABS
50.0000 mg | ORAL_TABLET | Freq: Two times a day (BID) | ORAL | Status: DC
  Administered 2012-12-10 – 2012-12-12 (×3): 50 mg via ORAL
  Filled 2012-12-10 (×5): qty 1

## 2012-12-10 MED ORDER — HYDROCODONE-ACETAMINOPHEN 7.5-325 MG PO TABS
1.0000 | ORAL_TABLET | ORAL | Status: DC
  Administered 2012-12-10: 1 via ORAL
  Administered 2012-12-10: 2 via ORAL
  Administered 2012-12-11: 1 via ORAL
  Administered 2012-12-11 – 2012-12-12 (×7): 2 via ORAL
  Filled 2012-12-10 (×10): qty 2

## 2012-12-10 MED ORDER — ONDANSETRON HCL 4 MG/2ML IJ SOLN: 4.0000 mg | Freq: Four times a day (QID) | INTRAMUSCULAR | Status: DC | PRN

## 2012-12-10 MED ORDER — FERROUS SULFATE 325 (65 FE) MG PO TABS
325.0000 mg | ORAL_TABLET | Freq: Three times a day (TID) | ORAL | Status: DC
  Administered 2012-12-11 – 2012-12-12 (×4): 325 mg via ORAL
  Filled 2012-12-10 (×8): qty 1

## 2012-12-10 MED ORDER — FLUOXETINE HCL 20 MG PO CAPS
40.0000 mg | ORAL_CAPSULE | Freq: Every day | ORAL | Status: DC
  Administered 2012-12-11 – 2012-12-12 (×2): 40 mg via ORAL
  Filled 2012-12-10 (×2): qty 2

## 2012-12-10 MED ORDER — GLYCOPYRROLATE 0.2 MG/ML IJ SOLN
INTRAMUSCULAR | Status: DC | PRN
  Administered 2012-12-10: .4 mg via INTRAVENOUS

## 2012-12-10 MED ORDER — PHENYLEPHRINE HCL 10 MG/ML IJ SOLN
INTRAMUSCULAR | Status: DC | PRN
  Administered 2012-12-10: 80 ug via INTRAVENOUS
  Administered 2012-12-10: 120 ug via INTRAVENOUS
  Administered 2012-12-10 (×2): 80 ug via INTRAVENOUS

## 2012-12-10 MED ORDER — MYCOPHENOLATE MOFETIL 250 MG PO CAPS
250.0000 mg | ORAL_CAPSULE | Freq: Two times a day (BID) | ORAL | Status: DC
  Filled 2012-12-10 (×2): qty 1

## 2012-12-10 MED ORDER — CLINDAMYCIN PHOSPHATE 600 MG/50ML IV SOLN
600.0000 mg | Freq: Four times a day (QID) | INTRAVENOUS | Status: AC
  Administered 2012-12-10 – 2012-12-11 (×2): 600 mg via INTRAVENOUS
  Filled 2012-12-10 (×2): qty 50

## 2012-12-10 MED ORDER — TACROLIMUS 1 MG PO CAPS
2.0000 mg | ORAL_CAPSULE | Freq: Two times a day (BID) | ORAL | Status: DC
  Administered 2012-12-10 – 2012-12-12 (×4): 2 mg via ORAL
  Filled 2012-12-10 (×6): qty 2

## 2012-12-10 MED ORDER — SUCCINYLCHOLINE CHLORIDE 20 MG/ML IJ SOLN
INTRAMUSCULAR | Status: DC | PRN
  Administered 2012-12-10: 100 mg via INTRAVENOUS

## 2012-12-10 MED ORDER — CHLORHEXIDINE GLUCONATE 4 % EX LIQD
60.0000 mL | Freq: Once | CUTANEOUS | Status: DC
Start: 2012-12-10 — End: 2012-12-10
  Filled 2012-12-10: qty 60

## 2012-12-10 MED ORDER — POLYETHYLENE GLYCOL 3350 17 G PO PACK
17.0000 g | PACK | Freq: Two times a day (BID) | ORAL | Status: DC
  Filled 2012-12-10 (×5): qty 1

## 2012-12-10 MED ORDER — RIVAROXABAN 10 MG PO TABS
10.0000 mg | ORAL_TABLET | ORAL | Status: DC
  Administered 2012-12-11 – 2012-12-12 (×2): 10 mg via ORAL
  Filled 2012-12-10 (×3): qty 1

## 2012-12-10 MED ORDER — AMLODIPINE BESYLATE 2.5 MG PO TABS
2.5000 mg | ORAL_TABLET | Freq: Every morning | ORAL | Status: DC
  Administered 2012-12-11 – 2012-12-12 (×2): 2.5 mg via ORAL
  Filled 2012-12-10 (×2): qty 1

## 2012-12-10 MED ORDER — HYDROMORPHONE HCL PF 1 MG/ML IJ SOLN
0.5000 mg | INTRAMUSCULAR | Status: DC | PRN
  Administered 2012-12-11: 1 mg via INTRAVENOUS
  Filled 2012-12-10: qty 1

## 2012-12-10 MED ORDER — METOCLOPRAMIDE HCL 5 MG/ML IJ SOLN: 5.0000 mg | Freq: Three times a day (TID) | INTRAMUSCULAR | Status: DC | PRN

## 2012-12-10 MED ORDER — METOCLOPRAMIDE HCL 10 MG PO TABS: 5.0000 mg | ORAL_TABLET | Freq: Three times a day (TID) | ORAL | Status: DC | PRN

## 2012-12-10 MED ORDER — METHOCARBAMOL 100 MG/ML IJ SOLN
500.0000 mg | Freq: Four times a day (QID) | INTRAVENOUS | Status: DC | PRN
  Administered 2012-12-10: 500 mg via INTRAVENOUS
  Filled 2012-12-10: qty 5

## 2012-12-10 MED ORDER — ZOLPIDEM TARTRATE 5 MG PO TABS: 5.0000 mg | ORAL_TABLET | Freq: Every evening | ORAL | Status: DC | PRN

## 2012-12-10 MED ORDER — MYCOPHENOLATE MOFETIL 250 MG PO CAPS
250.0000 mg | ORAL_CAPSULE | Freq: Two times a day (BID) | ORAL | Status: DC
  Administered 2012-12-10 – 2012-12-12 (×4): 250 mg via ORAL
  Filled 2012-12-10 (×5): qty 1

## 2012-12-10 MED ORDER — DOCUSATE SODIUM 100 MG PO CAPS
100.0000 mg | ORAL_CAPSULE | Freq: Two times a day (BID) | ORAL | Status: DC
  Administered 2012-12-10 – 2012-12-12 (×4): 100 mg via ORAL
  Filled 2012-12-10 (×5): qty 1

## 2012-12-10 MED ORDER — INSULIN ASPART 100 UNIT/ML ~~LOC~~ SOLN
0.0000 [IU] | Freq: Three times a day (TID) | SUBCUTANEOUS | Status: DC
  Administered 2012-12-11 (×2): 2 [IU] via SUBCUTANEOUS
  Administered 2012-12-11: 3 [IU] via SUBCUTANEOUS
  Administered 2012-12-12: 2 [IU] via SUBCUTANEOUS

## 2012-12-10 MED ORDER — METHOCARBAMOL 500 MG PO TABS: 500.0000 mg | ORAL_TABLET | Freq: Four times a day (QID) | ORAL | Status: DC | PRN

## 2012-12-10 MED ORDER — LIDOCAINE HCL 4 % MT SOLN
OROMUCOSAL | Status: DC | PRN
  Administered 2012-12-10: 4 mL via TOPICAL

## 2012-12-10 MED ORDER — DEXAMETHASONE SODIUM PHOSPHATE 10 MG/ML IJ SOLN
INTRAMUSCULAR | Status: DC | PRN
  Administered 2012-12-10: 10 mg via INTRAVENOUS

## 2012-12-10 MED ORDER — ONDANSETRON HCL 4 MG/2ML IJ SOLN
INTRAMUSCULAR | Status: DC | PRN
  Administered 2012-12-10: 4 mg via INTRAVENOUS

## 2012-12-10 MED ORDER — TACROLIMUS 1 MG PO CAPS
2.0000 mg | ORAL_CAPSULE | Freq: Two times a day (BID) | ORAL | Status: DC
  Filled 2012-12-10: qty 2

## 2012-12-10 MED ORDER — HYDROMORPHONE HCL PF 1 MG/ML IJ SOLN
0.2500 mg | INTRAMUSCULAR | Status: DC | PRN
  Administered 2012-12-10 (×2): 0.5 mg via INTRAVENOUS

## 2012-12-10 MED ORDER — ONDANSETRON HCL 4 MG PO TABS: 4.0000 mg | ORAL_TABLET | Freq: Four times a day (QID) | ORAL | Status: DC | PRN

## 2012-12-10 MED ORDER — CELECOXIB 200 MG PO CAPS
200.0000 mg | ORAL_CAPSULE | Freq: Two times a day (BID) | ORAL | Status: DC
  Administered 2012-12-10 – 2012-12-12 (×4): 200 mg via ORAL
  Filled 2012-12-10 (×5): qty 1

## 2012-12-10 MED ORDER — MIDAZOLAM HCL 5 MG/5ML IJ SOLN
INTRAMUSCULAR | Status: DC | PRN
  Administered 2012-12-10: 2 mg via INTRAVENOUS

## 2012-12-10 MED ORDER — PIOGLITAZONE HCL 30 MG PO TABS
30.0000 mg | ORAL_TABLET | Freq: Every day | ORAL | Status: DC
  Administered 2012-12-10 – 2012-12-12 (×3): 30 mg via ORAL
  Filled 2012-12-10 (×3): qty 1

## 2012-12-10 MED ORDER — ROCURONIUM BROMIDE 100 MG/10ML IV SOLN
INTRAVENOUS | Status: DC | PRN
  Administered 2012-12-10: 40 mg via INTRAVENOUS

## 2012-12-10 MED ORDER — SODIUM CHLORIDE 0.9 % IV SOLN
100.0000 mL/h | INTRAVENOUS | Status: DC
  Administered 2012-12-11: 100 mL/h via INTRAVENOUS
  Administered 2012-12-11: 20 mL/h via INTRAVENOUS
  Filled 2012-12-10 (×10): qty 1000

## 2012-12-10 MED ORDER — ALLOPURINOL 100 MG PO TABS
200.0000 mg | ORAL_TABLET | Freq: Every day | ORAL | Status: DC
  Administered 2012-12-10 – 2012-12-11 (×2): 200 mg via ORAL
  Filled 2012-12-10 (×3): qty 2

## 2012-12-10 MED ORDER — ALPRAZOLAM 0.25 MG PO TABS: 0.2500 mg | ORAL_TABLET | Freq: Every evening | ORAL | Status: DC | PRN

## 2012-12-10 MED ORDER — NEOSTIGMINE METHYLSULFATE 1 MG/ML IJ SOLN
INTRAMUSCULAR | Status: DC | PRN
  Administered 2012-12-10: 3 mg via INTRAVENOUS

## 2012-12-10 MED ORDER — FLUOXETINE HCL 40 MG PO CAPS: 40.0000 mg | ORAL_CAPSULE | Freq: Every day | ORAL | Status: DC

## 2012-12-10 MED ORDER — 0.9 % SODIUM CHLORIDE (POUR BTL) OPTIME
TOPICAL | Status: DC | PRN
  Administered 2012-12-10: 1000 mL

## 2012-12-10 MED ORDER — MENTHOL 3 MG MT LOZG: 1.0000 | LOZENGE | OROMUCOSAL | Status: DC | PRN

## 2012-12-10 MED ORDER — ALUM & MAG HYDROXIDE-SIMETH 200-200-20 MG/5ML PO SUSP: 30.0000 mL | ORAL | Status: DC | PRN

## 2012-12-10 MED ORDER — SUFENTANIL CITRATE 50 MCG/ML IV SOLN
INTRAVENOUS | Status: DC | PRN
  Administered 2012-12-10: 10 ug via INTRAVENOUS
  Administered 2012-12-10: 5 ug via INTRAVENOUS
  Administered 2012-12-10: 10 ug via INTRAVENOUS
  Administered 2012-12-10: 5 ug via INTRAVENOUS
  Administered 2012-12-10 (×2): 10 ug via INTRAVENOUS

## 2012-12-10 MED ORDER — HYDROMORPHONE HCL PF 1 MG/ML IJ SOLN
INTRAMUSCULAR | Status: DC | PRN
  Administered 2012-12-10 (×2): 1 mg via INTRAVENOUS

## 2012-12-10 SURGICAL SUPPLY — 39 items
BAG ZIPLOCK 12X15 (MISCELLANEOUS) ×4 IMPLANT
BLADE SAW SGTL 18X1.27X75 (BLADE) ×2 IMPLANT
CLOTH BEACON ORANGE TIMEOUT ST (SAFETY) ×2 IMPLANT
DERMABOND ADVANCED (GAUZE/BANDAGES/DRESSINGS) ×1
DERMABOND ADVANCED .7 DNX12 (GAUZE/BANDAGES/DRESSINGS) ×1 IMPLANT
DRAPE C-ARM 42X72 X-RAY (DRAPES) ×2 IMPLANT
DRAPE STERI IOBAN 125X83 (DRAPES) ×2 IMPLANT
DRAPE U-SHAPE 47X51 STRL (DRAPES) ×6 IMPLANT
DRSG AQUACEL AG ADV 3.5X10 (GAUZE/BANDAGES/DRESSINGS) ×2 IMPLANT
DRSG TEGADERM 4X4.75 (GAUZE/BANDAGES/DRESSINGS) ×4 IMPLANT
DURAPREP 26ML APPLICATOR (WOUND CARE) ×2 IMPLANT
ELECT BLADE TIP CTD 4 INCH (ELECTRODE) ×2 IMPLANT
ELECT REM PT RETURN 9FT ADLT (ELECTROSURGICAL) ×2
ELECTRODE REM PT RTRN 9FT ADLT (ELECTROSURGICAL) ×1 IMPLANT
EVACUATOR 1/8 PVC DRAIN (DRAIN) ×2 IMPLANT
FACESHIELD LNG OPTICON STERILE (SAFETY) ×8 IMPLANT
GAUZE SPONGE 2X2 8PLY STRL LF (GAUZE/BANDAGES/DRESSINGS) ×1 IMPLANT
GLOVE BIOGEL PI IND STRL 7.5 (GLOVE) ×1 IMPLANT
GLOVE BIOGEL PI IND STRL 8 (GLOVE) ×2 IMPLANT
GLOVE BIOGEL PI INDICATOR 7.5 (GLOVE) ×1
GLOVE BIOGEL PI INDICATOR 8 (GLOVE) ×2
GLOVE ECLIPSE 8.0 STRL XLNG CF (GLOVE) ×4 IMPLANT
GLOVE ORTHO TXT STRL SZ7.5 (GLOVE) ×4 IMPLANT
GOWN BRE IMP PREV XXLGXLNG (GOWN DISPOSABLE) ×2 IMPLANT
GOWN STRL NON-REIN LRG LVL3 (GOWN DISPOSABLE) ×4 IMPLANT
GOWN STRL REIN XL XLG (GOWN DISPOSABLE) ×4 IMPLANT
KIT BASIN OR (CUSTOM PROCEDURE TRAY) ×2 IMPLANT
PACK TOTAL JOINT (CUSTOM PROCEDURE TRAY) ×2 IMPLANT
PADDING CAST COTTON 6X4 STRL (CAST SUPPLIES) ×2 IMPLANT
SPONGE GAUZE 2X2 STER 10/PKG (GAUZE/BANDAGES/DRESSINGS) ×1
SUCTION FRAZIER 12FR DISP (SUCTIONS) ×2 IMPLANT
SUT MNCRL AB 4-0 PS2 18 (SUTURE) ×2 IMPLANT
SUT VIC AB 1 CT1 36 (SUTURE) ×6 IMPLANT
SUT VIC AB 2-0 CT1 27 (SUTURE) ×2
SUT VIC AB 2-0 CT1 TAPERPNT 27 (SUTURE) ×2 IMPLANT
SUT VLOC 180 0 24IN GS25 (SUTURE) ×2 IMPLANT
TOWEL OR 17X26 10 PK STRL BLUE (TOWEL DISPOSABLE) ×4 IMPLANT
TRAY FOLEY CATH 14FRSI W/METER (CATHETERS) ×2 IMPLANT
WATER STERILE IRR 1500ML POUR (IV SOLUTION) ×4 IMPLANT

## 2012-12-10 NOTE — Progress Notes (Signed)
X-RAY RESULTS NOTED. 

## 2012-12-10 NOTE — Progress Notes (Signed)
PORTABLE AP PELVIS AND LATERAL RIGHT HIP X-RAYS DONE. 

## 2012-12-10 NOTE — Anesthesia Preprocedure Evaluation (Addendum)
Anesthesia Evaluation  Patient identified by MRN, date of birth, ID band Patient awake    Reviewed: Allergy & Precautions, H&P , NPO status , Patient's Chart, lab work & pertinent test results, reviewed documented beta blocker date and time   Airway Mallampati: II TM Distance: >3 FB Neck ROM: full    Dental No notable dental hx.    Pulmonary pneumonia -,  breath sounds clear to auscultation  Pulmonary exam normal       Cardiovascular Exercise Tolerance: Good hypertension, On Medications and On Home Beta Blockers Rhythm:regular Rate:Normal     Neuro/Psych negative neurological ROS  negative psych ROS   GI/Hepatic Neg liver ROS, GERD-  Medicated,  Endo/Other  negative endocrine ROSdiabetes, Type 2, Oral Hypoglycemic Agents  Renal/GU Renal diseaseS/P kidney transplant for polycystic disease.  negative genitourinary   Musculoskeletal   Abdominal   Peds  Hematology negative hematology ROS (+)   Anesthesia Other Findings   Reproductive/Obstetrics negative OB ROS                           Anesthesia Physical Anesthesia Plan  ASA: III  Anesthesia Plan: General   Post-op Pain Management:    Induction:   Airway Management Planned: Oral ETT  Additional Equipment:   Intra-op Plan:   Post-operative Plan:   Informed Consent: I have reviewed the patients History and Physical, chart, labs and discussed the procedure including the risks, benefits and alternatives for the proposed anesthesia with the patient or authorized representative who has indicated his/her understanding and acceptance.   Dental Advisory Given  Plan Discussed with: CRNA  Anesthesia Plan Comments:        Anesthesia Quick Evaluation

## 2012-12-10 NOTE — Interval H&P Note (Signed)
History and Physical Interval Note:  12/10/2012 12:26 PM  James Zimmerman  has presented today for surgery, with the diagnosis of Osteoarthritis of the Right Hip  The various methods of treatment have been discussed with the patient and family. After consideration of risks, benefits and other options for treatment, the patient has consented to  Procedure(s) (LRB) with comments: TOTAL HIP ARTHROPLASTY ANTERIOR APPROACH (Right) as a surgical intervention .  The patient's history has been reviewed, patient examined, no change in status, stable for surgery.  I have reviewed the patient's chart and labs.  Questions were answered to the patient's satisfaction.     Shelda Pal

## 2012-12-10 NOTE — Anesthesia Postprocedure Evaluation (Signed)
  Anesthesia Post-op Note  Patient: James Zimmerman  Procedure(s) Performed: Procedure(s) (LRB): TOTAL HIP ARTHROPLASTY ANTERIOR APPROACH (Right)  Patient Location: PACU  Anesthesia Type: General  Level of Consciousness: awake and alert   Airway and Oxygen Therapy: Patient Spontanous Breathing  Post-op Pain: mild  Post-op Assessment: Post-op Vital signs reviewed, Patient's Cardiovascular Status Stable, Respiratory Function Stable, Patent Airway and No signs of Nausea or vomiting  Last Vitals:  Filed Vitals:   12/10/12 1645  BP: 129/73  Pulse: 64  Temp: 36.9 C  Resp: 14    Post-op Vital Signs: stable   Complications: No apparent anesthesia complications. Urine output OK.

## 2012-12-10 NOTE — Transfer of Care (Signed)
Immediate Anesthesia Transfer of Care Note  Patient: James Zimmerman  Procedure(s) Performed: Procedure(s) (LRB) with comments: TOTAL HIP ARTHROPLASTY ANTERIOR APPROACH (Right)  Patient Location: PACU  Anesthesia Type:General  Level of Consciousness: awake, alert , oriented and patient cooperative  Airway & Oxygen Therapy: Patient Spontanous Breathing and Patient connected to face mask oxygen  Post-op Assessment: Report given to PACU RN, Post -op Vital signs reviewed and stable and Patient moving all extremities  Post vital signs: stable  Complications: No apparent anesthesia complications

## 2012-12-10 NOTE — Op Note (Signed)
NAME:  [name]                ACCOUNT NO.: [id number]      MEDICAL RECORD NO.: [id number]      FACILITY:  [facility]      PHYSICIAN:  Durene Romans D  DATE OF BIRTH:  1951/08/22     DATE OF PROCEDURE:  12/10/2012                                 OPERATIVE REPORT         PREOPERATIVE DIAGNOSIS: Right  hip osteoarthritis.      POSTOPERATIVE DIAGNOSIS:  Right hip osteoarthritis.      PROCEDURE:  Right total hip replacement through an anterior approach   utilizing DePuy THR system, component size 56mm pinnacle cup, a size 36+4 neutral   Altrex liner, a size 7 Hi Tri Lock stem with a 36+1.5 delta ceramic   ball.      SURGEON:  Madlyn Frankel. Charlann Boxer, M.D.      ASSISTANT:  Lanney Gins, PA      ANESTHESIA:  General.      SPECIMENS:  None.      COMPLICATIONS:  None.      BLOOD LOSS:  700 cc     DRAINS:  One Hemovac.      INDICATION OF THE PROCEDURE:  James Zimmerman is a 61 y.o. male who had   presented to office for evaluation of right hip pain.  Radiographs revealed   progressive degenerative changes with bone-on-bone   articulation to the  hip joint.  The patient had painful limited range of   motion significantly affecting their overall quality of life.  The patient was failing to    respond to conservative measures, and at this point was ready   to proceed with more definitive measures.  The patient has noted progressive   degenerative changes in his hip, progressive problems and dysfunction   with regarding the hip prior to surgery.  Consent was obtained for   benefit of pain relief.  Specific risk of infection, DVT, component   failure, dislocation, need for revision surgery, as well discussion of   the anterior versus posterior approach were reviewed.  Consent was   obtained for benefit of anterior pain relief through an anterior   approach.      PROCEDURE IN DETAIL:  The patient was brought to operative theater.   Once adequate  anesthesia, preoperative antibiotics, 2gm Ancef administered.   The patient was positioned supine on the OSI Hanna table.  Once adequate   padding of boney process was carried out, we had predraped out the hip, and  used fluoroscopy to confirm orientation of the pelvis and position.      The right hip was then prepped and draped from proximal iliac crest to   mid thigh with shower curtain technique.      Time-out was performed identifying the patient, planned procedure, and   extremity.     An incision was then made 2 cm distal and lateral to the   anterior superior iliac spine extending over the orientation of the   tensor fascia lata muscle and sharp dissection was carried down to the   fascia of the muscle and protractor placed in the soft tissues.      The fascia was then incised.  The muscle belly was identified and  swept   laterally and retractor placed along the superior neck.  Following   cauterization of the circumflex vessels and removing some pericapsular   fat, a second cobra retractor was placed on the inferior neck.  A third   retractor was placed on the anterior acetabulum after elevating the   anterior rectus.  A L-capsulotomy was along the line of the   superior neck to the trochanteric fossa, then extended proximally and   distally.  Tag sutures were placed and the retractors were then placed   intracapsular.  We then identified the trochanteric fossa and   orientation of my neck cut, confirmed this radiographically   and then made a neck osteotomy with the femur on traction.  The femoral   head was removed without difficulty or complication.  Traction was let   off and retractors were placed posterior and anterior around the   acetabulum.      The labrum and foveal tissue were debrided.  I began reaming with a 49mm   reamer and reamed up to 55mm reamer with good bony bed preparation and a 56   cup was chosen.  The final 56mm Pinnacle cup was then impacted under  fluoroscopy  to confirm the depth of penetration and orientation with respect to   abduction.  A screw was placed followed by the hole eliminator.  The final   36+4 neutral Altrex liner was impacted with good visualized rim fit.  The cup was positioned anatomically within the acetabular portion of the pelvis.      At this point, the femur was rolled at 80 degrees.  Further capsule was   released off the inferior aspect of the femoral neck.  I then   released the superior capsule proximally.  The hook was placed laterally   along the femur and elevated manually and held in position with the bed   hook.  The leg was then extended and adducted with the leg rolled to 100   degrees of external rotation.  Once the proximal femur was fully   exposed, I used a box osteotome to set orientation.  I then began   broaching with the starting chili pepper broach and passed this by hand and then broached up to 7.  With the 7 broach in place I chose a high offset neck and did a trial reduction.  The offset was appropriate, leg lengths   appeared to be equal, confirmed radiographically.   Given these findings, I went ahead and dislocated the hip, repositioned all   retractors and positioned the right hip in the extended and abducted position.  The final 7 Hi Tri Lock stem was   chosen and it was impacted down to the level of neck cut.  Based on this   and the trial reduction, a 36+1.5 delta ceramic ball was chosen and   impacted onto a clean and dry trunnion, and the hip was reduced.  The   hip had been irrigated throughout the case again at this point.  I did   reapproximate the superior capsular leaflet to the anterior leaflet   using #1 Vicryl, placed a medium Hemovac drain deep.  The fascia of the   tensor fascia lata muscle was then reapproximated using #1 Vicryl.  The   remaining wound was closed with 2-0 Vicryl and running 4-0 Monocryl.   The hip was cleaned, dried, and dressed sterilely using  Dermabond and   Aquacel dressing.  Drain site dressed separately.  She was then brought   to recovery room in stable condition tolerating the procedure well.    James Orleans, PA-C was present for the entirety of the case involved from   preoperative positioning, perioperative retractor management, general   facilitation of the case, as well as primary wound closure as assistant.            Pietro Cassis Alvan Dame, M.D.            MDO/MEDQ  D:  10/17/2011  T:  10/17/2011  Job:  [id number]      Electronically Signed by Paralee Cancel M.D. on 10/23/2011 09:15:38 AM

## 2012-12-10 NOTE — Preoperative (Signed)
Beta Blockers   Reason not to administer Beta Blockers:Not Applicable,  took metoprolol at 8:30 this am

## 2012-12-11 ENCOUNTER — Encounter (HOSPITAL_COMMUNITY): Payer: Self-pay | Admitting: Orthopedic Surgery

## 2012-12-11 DIAGNOSIS — E669 Obesity, unspecified: Secondary | ICD-10-CM

## 2012-12-11 DIAGNOSIS — D500 Iron deficiency anemia secondary to blood loss (chronic): Secondary | ICD-10-CM

## 2012-12-11 DIAGNOSIS — E871 Hypo-osmolality and hyponatremia: Secondary | ICD-10-CM

## 2012-12-11 LAB — CBC
HCT: 35.9 % — ABNORMAL LOW (ref 39.0–52.0)
MCV: 97 fL (ref 78.0–100.0)
RBC: 3.7 MIL/uL — ABNORMAL LOW (ref 4.22–5.81)
WBC: 9.8 10*3/uL (ref 4.0–10.5)

## 2012-12-11 LAB — BASIC METABOLIC PANEL
BUN: 28 mg/dL — ABNORMAL HIGH (ref 6–23)
CO2: 26 mEq/L (ref 19–32)
Chloride: 97 mEq/L (ref 96–112)
Creatinine, Ser: 1.64 mg/dL — ABNORMAL HIGH (ref 0.50–1.35)
GFR calc Af Amer: 50 mL/min — ABNORMAL LOW (ref >90)
GFR calc non Af Amer: 43 mL/min — ABNORMAL LOW (ref >90)
Glucose, Bld: 214 mg/dL — ABNORMAL HIGH (ref 70–99)

## 2012-12-11 LAB — GLUCOSE, CAPILLARY
Glucose-Capillary: 170 mg/dL — ABNORMAL HIGH (ref 70–99)
Glucose-Capillary: 190 mg/dL — ABNORMAL HIGH (ref 70–99)

## 2012-12-11 MED ORDER — DSS 100 MG PO CAPS: 100.0000 mg | ORAL_CAPSULE | Freq: Two times a day (BID) | ORAL | Status: DC

## 2012-12-11 MED ORDER — METHOCARBAMOL 500 MG PO TABS: 500.0000 mg | ORAL_TABLET | Freq: Four times a day (QID) | ORAL | Status: DC | PRN

## 2012-12-11 MED ORDER — ASPIRIN EC 325 MG PO TBEC: 325.0000 mg | DELAYED_RELEASE_TABLET | Freq: Two times a day (BID) | ORAL | Status: DC

## 2012-12-11 MED ORDER — FERROUS SULFATE 325 (65 FE) MG PO TABS: 325.0000 mg | ORAL_TABLET | Freq: Three times a day (TID) | ORAL | Status: DC

## 2012-12-11 MED ORDER — POLYETHYLENE GLYCOL 3350 17 G PO PACK: 17.0000 g | PACK | Freq: Two times a day (BID) | ORAL | Status: DC

## 2012-12-11 MED ORDER — DIPHENHYDRAMINE HCL 25 MG PO CAPS: 25.0000 mg | ORAL_CAPSULE | Freq: Four times a day (QID) | ORAL | Status: DC | PRN

## 2012-12-11 MED ORDER — HYDROCODONE-ACETAMINOPHEN 7.5-325 MG PO TABS: 1.0000 | ORAL_TABLET | ORAL | Status: DC | PRN

## 2012-12-11 NOTE — Progress Notes (Signed)
Utilization review completed.  

## 2012-12-11 NOTE — Care Management Note (Addendum)
    Page 1 of 2   12/12/2012     11:53:53 AM   CARE MANAGEMENT NOTE 12/12/2012  Patient:  James Zimmerman, James Zimmerman   Account Number:  [id number]  Date Initiated:  12/11/2012  Documentation initiated by:  Lorenda Ishihara  Subjective/Objective Assessment:   61 yo male admitted s/p Total hip replacement. PTA lived at home with spouse.     Action/Plan:   Home with HH when stable   Anticipated DC Date:  12/12/2012   Anticipated DC Plan:  HOME W HOME HEALTH SERVICES      DC Planning Services  CM consult      [facility] Choice  HOME HEALTH   Choice offered to / List presented to:  C-1 Patient   DME arranged  [facility]  3-N-1      DME agency  [facility]     HH arranged  HH-2 PT  HH-1 RN      [facility] agency  [facility]   Status of service:  Completed, signed off Medicare Important Message given?  YES (If response is "NO", the following Medicare IM given date fields will be blank) Date Medicare IM given:  12/04/2012 Date Additional Medicare IM given:    Discharge Disposition:  HOME W HOME HEALTH SERVICES  Per UR Regulation:  Reviewed for med. necessity/level of care/duration of stay  If discussed at Long Length of Stay Meetings, dates discussed:    Comments:  12-12-12 Lorenda Ishihara RN CM Arranged [facility] for Lab Draws with Genevieve Norlander.  BMET on Monday 12-23 fax results to Dr. Briant Cedar at [phone].

## 2012-12-11 NOTE — Progress Notes (Signed)
Physical Therapy Treatment Patient Details Name: James Zimmerman MRN: [id number] DOB: 03/07/1951 Today's Date: 12/11/2012 Time: 7829-5621 PT Time Calculation (min): 23 min  PT Assessment / Plan / Recommendation Comments on Treatment Session  Improved mobility and pain level this pm. Will plan to practice steps on tomorrow prior to d/c. Recommend HHPT    Follow Up Recommendations  Home health PT     Does the patient have the potential to tolerate intense rehabilitation     Barriers to Discharge        Equipment Recommendations  Rolling walker with 5" wheels    Recommendations for Other Services OT consult  Frequency 7X/week   Plan Discharge plan remains appropriate    Precautions / Restrictions Precautions Precautions: None Restrictions Weight Bearing Restrictions: No RLE Weight Bearing: Weight bearing as tolerated   Pertinent Vitals/Pain 7/10 R hip    Mobility  Bed Mobility Bed Mobility: Sit to Supine Supine to Sit: HOB elevated;With rails Sit to Supine: 4: Min assist Details for Bed Mobility Assistance: Assist for R LE onto bed.  Transfers Transfers: Sit to Stand;Stand to Sit Sit to Stand: 4: Min guard;From chair/3-in-1 Stand to Sit: 4: Min guard;To bed;To elevated surface Details for Transfer Assistance: VCs safety, technique,hand placement. Ambulation/Gait Ambulation/Gait Assistance: 4: Min guard Ambulation Distance (Feet): 80 Feet Assistive device: Rolling walker Ambulation/Gait Assistance Details: VCs safety, technique, sequence. No external assist needed to advance LE, however pt is unable to step with R LE first-prefers to step wtih "good leg" first. Encouraged pt to DF foot during swing through.  Gait Pattern: Step-through pattern;Decreased stride length;Decreased step length - right;Antalgic;Decreased dorsiflexion - right    Exercises Total Joint Exercises Ankle Circles/Pumps: AROM;Both;10 reps;Supine Quad Sets: AROM;Both;10 reps;Supine Short Arc  Quad: AROM;Right;10 reps;Supine Heel Slides: AAROM;Right;10 reps;Supine Hip ABduction/ADduction: AAROM;Right;10 reps;Supine   PT Diagnosis: Difficulty walking;Abnormality of gait;Acute pain  PT Problem List: Decreased strength;Decreased range of motion;Decreased activity tolerance;Decreased mobility;Pain;Decreased knowledge of use of DME PT Treatment Interventions: DME instruction;Gait training;Stair training;Functional mobility training;Therapeutic activities;Therapeutic exercise;Patient/family education   PT Goals Acute Rehab PT Goals PT Goal Formulation: With patient Time For Goal Achievement: 12/18/12 Potential to Achieve Goals: Good Pt will go Supine/Side to Sit: with supervision PT Goal: Supine/Side to Sit - Progress: Goal set today Pt will go Sit to Supine/Side: with supervision PT Goal: Sit to Supine/Side - Progress: Progressing toward goal Pt will go Sit to Stand: with supervision PT Goal: Sit to Stand - Progress: Progressing toward goal Pt will Ambulate: 51 - 150 feet;with supervision;with rolling walker PT Goal: Ambulate - Progress: Progressing toward goal Pt will Go Up / Down Stairs: 3-5 stairs;with min assist;with least restrictive assistive device;with rail(s) PT Goal: Up/Down Stairs - Progress: Goal set today  Visit Information  Last PT Received On: 12/11/12 Assistance Needed: +1    Subjective Data  Subjective: "i was able to walk to the bathroom after you left" Patient Stated Goal: home. less pain   Cognition  Overall Cognitive Status: Appears within functional limits for tasks assessed/performed Arousal/Alertness: Awake/alert Orientation Level: Appears intact for tasks assessed Behavior During Session: [facility] for tasks performed    Balance     End of Session PT - End of Session Equipment Utilized During Treatment: Gait belt Activity Tolerance: Patient limited by pain Patient left: in bed;with call bell/phone within reach;with family/visitor present Nurse  Communication: Mobility status;Weight bearing status   GP     Rebeca Alert [facility] 12/11/2012, 3:14 PM [phone]

## 2012-12-11 NOTE — Progress Notes (Signed)
   Subjective: 1 Day Post-Op Procedure(s) (LRB): TOTAL HIP ARTHROPLASTY ANTERIOR APPROACH (Right)   Patient reports pain as mild, pain well controlled. No events throughout the night. Ready for discharge if he does well with PT.  Objective:   VITALS:   Filed Vitals:   12/11/12  BP: 115/67  Pulse: 67  Temp: 97.8 F (36.6 C)   Resp: 16    Neurovascular intact Dorsiflexion/Plantar flexion intact Incision: dressing C/D/I No cellulitis present Compartment soft  LABS  Basename 12/11/12 0453  HGB 12.0*  HCT 35.9*  WBC 9.8  PLT 120*     Basename 12/11/12 0453  NA 131*  K 4.7  BUN 28*  CREATININE 1.64*  GLUCOSE 214*     Assessment/Plan: 1 Day Post-Op Procedure(s) (LRB): TOTAL HIP ARTHROPLASTY ANTERIOR APPROACH (Right) HV drain d/c'ed Foley cath d/c'ed Advance diet Up with therapy D/C IV fluids Discharge home with home health if he does well with PT Follow up in 2 weeks at [facility]. Follow up with OLIN,Sharif Rendell D in 2 weeks.  Contact information:  [facility] [address] [place] [address] [phone]     Expected ABLA  Treated with iron and will observe  Obese (BMI 30-39.9) Estimated Body mass index is 31.15 kg/(m^2) as calculated from the following:   Height as of this encounter: 6\' 0" (1.829 m).   Weight as of this encounter: 229 lb 11.2 oz(104.191 kg). Patient also counseled that weight may inhibit the healing process Patient counseled that losing weight will help with future health issues  Hyponatremia Treated with IV fluids and will observe      James Zimmerman. Navayah Sok   PAC  12/11/2012, 10:01 AM

## 2012-12-11 NOTE — Progress Notes (Signed)
Per [place], RN she spoke to [employer] Pa and states patient can go home if does ok with PT. PT on floor states patient only took a few steps and recommends staying until tomorrow will notify PA.

## 2012-12-11 NOTE — Evaluation (Signed)
Occupational Therapy Evaluation Patient Details Name: James Zimmerman MRN: [id number] DOB: 08/05/1951 Today's Date: 12/11/2012 Time: 0109-3235 OT Time Calculation (min): 11 min  OT Assessment / Plan / Recommendation Clinical Impression  This 61 year old man is s/p R anterior direct approach THA.  All education was completed.  Pt does not need further OT.     OT Assessment  Patient needs continued OT Services    Follow Up Recommendations  No OT follow up    Barriers to Discharge      Equipment Recommendations  3 in 1 bedside comode    Recommendations for Other Services    Frequency       Precautions / Restrictions Precautions Precautions: None Restrictions Weight Bearing Restrictions: No RLE Weight Bearing: Weight bearing as tolerated   Pertinent Vitals/Pain RLE sore    ADL  Toilet Transfer: Performed;Min guard Toilet Transfer Method: Sit to stand Toilet Transfer Equipment: Raised toilet seat with arms (or 3-in-1 over toilet) Tub/Shower Transfer: Performed;Min guard Tub/Shower Transfer Method: Ambulating Transfers/Ambulation Related to ADLs: pt ambulated to bathroom.  He found it easier to sequence with LLE first ADL Comments: Family present and will help pt with adls.  Clarified no hip precautions, but pt is limited with reaching to legs:  min A for LB adls.      OT Diagnosis:    OT Problem List:   OT Treatment Interventions:     OT Goals    Visit Information  Last OT Received On: 12/11/12 Assistance Needed: +1    Subjective Data  Subjective: I find it easier to step with my good leg first Patient Stated Goal: none stated   Prior Functioning     Home Living Bathroom Shower/Tub: Walk-in shower Bathroom Toilet: Handicapped height Additional Comments: 3:1 was delivered to room Communication Communication: No difficulties         Vision/Perception     Cognition  Overall Cognitive Status: Appears within functional limits for tasks  assessed/performed Arousal/Alertness: Awake/alert Orientation Level: Appears intact for tasks assessed Behavior During Session: [facility] for tasks performed    Extremity/Trunk Assessment Right Upper Extremity Assessment RUE ROM/Strength/Tone: [facility] for tasks assessed Left Upper Extremity Assessment LUE ROM/Strength/Tone: WFL for tasks assessed     Mobility Bed Mobility Bed Mobility: Sit to Supine Sit to Supine: 4: Min assist Details for Bed Mobility Assistance: Assist for R LE onto bed.  Transfers Sit to Stand: 4: Min guard;From chair/3-in-1 Stand to Sit: 4: Min guard;To bed;To elevated surface Details for Transfer Assistance: VCs safety, technique,hand placement.     Shoulder Instructions     Exercise    Balance     End of Session OT - End of Session Activity Tolerance: Patient tolerated treatment well Patient left: Other (comment) (hand off to PT)  GO     James Zimmerman 12/11/2012, 3:39 PM Marica Otter, OTR/L [phone] 12/11/2012

## 2012-12-11 NOTE — Evaluation (Signed)
Physical Therapy Evaluation Patient Details Name: James Zimmerman MRN: [id number] DOB: 28-Nov-1951 Today's Date: 12/11/2012 Time: 1100-1115 PT Time Calculation (min): 15 min  PT Assessment / Plan / Recommendation Clinical Impression  61 yo male s/p R THA-direct anterior. Mobility significantly limited by pain-10/10 with activity. Plan was for d/c home today but do not feel pt is able to safely d/c today (pt/wife does not feel capable to d/c either)-MD notified. Recommend HHPT, RW.     PT Assessment  Patient needs continued PT services    Follow Up Recommendations  Home health PT    Does the patient have the potential to tolerate intense rehabilitation      Barriers to Discharge        Equipment Recommendations  Rolling walker with 5" wheels    Recommendations for Other Services OT consult   Frequency 7X/week    Precautions / Restrictions Precautions Precautions: None Restrictions Weight Bearing Restrictions: No RLE Weight Bearing: Weight bearing as tolerated   Pertinent Vitals/Pain "not bad" if at rest; 10/10 with activity      Mobility  Bed Mobility Bed Mobility: Supine to Sit Supine to Sit: HOB elevated;With rails; Min assist Details for Bed Mobility Assistance: Max encouragment for OOB. Multiple attempts with pt repeatedly stating "I can't do it". VCs safety, technique, hand placement. Assist for R LE off bed. Increased time.  Transfers Transfers: Sit to Stand;Stand to Sit Sit to Stand: From elevated surface;With upper extremity assist;From bed;4: Min assist Stand to Sit: To chair/3-in-1;4: Min assist;With upper extremity assist;With armrests Details for Transfer Assistance: VCs safety, technique, hand placement. Assist to rise, stabilize, control descent.  Ambulation/Gait Ambulation/Gait Assistance: 4: Min assist Ambulation Distance (Feet): 5 Feet Assistive device: Rolling walker Ambulation/Gait Assistance Details: VCs safety, technique, sequence. Assist to  advance R LE forward. Fatigues easily. Pt c/o dizziness-deferred further ambulation. Recliner brought up behind pt.  Gait Pattern: Step-to pattern;Antalgic;Decreased stance time - right;Decreased dorsiflexion - right;Decreased step length - right;Decreased stride length    Shoulder Instructions     Exercises     PT Diagnosis: Difficulty walking;Abnormality of gait;Acute pain  PT Problem List: Decreased strength;Decreased range of motion;Decreased activity tolerance;Decreased mobility;Pain;Decreased knowledge of use of DME PT Treatment Interventions: DME instruction;Gait training;Stair training;Functional mobility training;Therapeutic activities;Therapeutic exercise;Patient/family education   PT Goals Acute Rehab PT Goals PT Goal Formulation: With patient Time For Goal Achievement: 12/18/12 Potential to Achieve Goals: Good Pt will go Supine/Side to Sit: with supervision PT Goal: Supine/Side to Sit - Progress: Goal set today Pt will go Sit to Supine/Side: with supervision PT Goal: Sit to Supine/Side - Progress: Goal set today Pt will go Sit to Stand: with supervision PT Goal: Sit to Stand - Progress: Goal set today Pt will Ambulate: 51 - 150 feet;with supervision;with rolling walker PT Goal: Ambulate - Progress: Goal set today Pt will Go Up / Down Stairs: 3-5 stairs;with min assist;with least restrictive assistive device;with rail(s) PT Goal: Up/Down Stairs - Progress: Goal set today  Visit Information  Last PT Received On: 12/11/12 Assistance Needed: +1    Subjective Data  Subjective: "I can't do it. I can't move this leg." Patient Stated Goal: Home. Less pain   Prior Functioning  Home Living Lives With: Spouse Available Help at Discharge: Family Type of Home: House Home Access: Stairs to enter Secretary/administrator of Steps: 4 Entrance Stairs-Rails: Right Home Layout: One level Home Adaptive Equipment: None Prior Function Level of Independence: Independent Able to Take  Stairs?: Yes Driving: Yes Communication  Communication: No difficulties    Cognition  Overall Cognitive Status: Appears within functional limits for tasks assessed/performed Arousal/Alertness: Awake/alert Orientation Level: Appears intact for tasks assessed Behavior During Session: [facility] for tasks performed    Extremity/Trunk Assessment Right Lower Extremity Assessment RLE ROM/Strength/Tone: Unable to fully assess;Due to pain;Deficits RLE ROM/Strength/Tone Deficits: moves ankle well. Hip flex 2-/5 Left Lower Extremity Assessment LLE ROM/Strength/Tone: [facility] for tasks assessed Trunk Assessment Trunk Assessment: Normal   Balance    End of Session PT - End of Session Activity Tolerance: Patient limited by pain;Patient limited by fatigue;Other (comment) (Limited by dizziness) Patient left: in chair;with call bell/phone within reach;with family/visitor present Nurse Communication: Mobility status;Weight bearing status  GP     Rebeca Alert [facility] 12/11/2012, 11:27 AM [phone]

## 2012-12-12 DIAGNOSIS — E119 Type 2 diabetes mellitus without complications: Secondary | ICD-10-CM

## 2012-12-12 LAB — CBC
Hemoglobin: 11 g/dL — ABNORMAL LOW (ref 13.0–17.0)
MCH: 32.8 pg (ref 26.0–34.0)
MCV: 96.4 fL (ref 78.0–100.0)
Platelets: 101 10*3/uL — ABNORMAL LOW (ref 150–400)
RBC: 3.35 MIL/uL — ABNORMAL LOW (ref 4.22–5.81)
WBC: 6.8 10*3/uL (ref 4.0–10.5)

## 2012-12-12 LAB — BASIC METABOLIC PANEL
CO2: 23 mEq/L (ref 19–32)
Calcium: 8.3 mg/dL — ABNORMAL LOW (ref 8.4–10.5)
Chloride: 104 mEq/L (ref 96–112)
Creatinine, Ser: 1.74 mg/dL — ABNORMAL HIGH (ref 0.50–1.35)
Glucose, Bld: 160 mg/dL — ABNORMAL HIGH (ref 70–99)

## 2012-12-12 LAB — GLUCOSE, CAPILLARY: Glucose-Capillary: 149 mg/dL — ABNORMAL HIGH (ref 70–99)

## 2012-12-12 NOTE — Progress Notes (Signed)
Patient ID: James Zimmerman, male   DOB: 07-29-1951, 61 y.o.   MRN: [id number] Subjective: 2 Days Post-Op Procedure(s) (LRB): TOTAL HIP ARTHROPLASTY ANTERIOR APPROACH (Right)    Patient reports pain as mild. Doing better today, ready to head home.  Concerned with blood in urine particularly due to history of renal transplant  Objective:   VITALS:   Filed Vitals:   12/12/12 1000  BP: 127/59  Pulse: 79  Temp: 98.7 F (37.1 C)  Resp: 20    Neurovascular intact Incision: dressing C/D/I  LABS  Basename 12/12/12 0420 12/11/12 0453  HGB 11.0* 12.0*  HCT 32.3* 35.9*  WBC 6.8 9.8  PLT 101* 120*     Basename 12/12/12 0420 12/11/12 0453  NA 133* 131*  K 4.0 4.7  BUN 28* 28*  CREATININE 1.74* 1.64*  GLUCOSE 160* 214*    No results found for this basename: LABPT:2,INR:2 in the last 72 hours   Assessment/Plan: 2 Days Post-Op Procedure(s) (LRB): TOTAL HIP ARTHROPLASTY ANTERIOR APPROACH (Right)   Up with therapy Discharge home with home health  Reviewed concerns with Briant Cedar his Nephrologist and related this back to Mr Edgell.  This helped make things easier on his mind  RTC 2 weeks

## 2012-12-12 NOTE — Progress Notes (Signed)
Physical Therapy Treatment Patient Details Name: James Zimmerman MRN: [id number] DOB: 1951-01-05 Today's Date: 12/12/2012 Time: 2130-8657 PT Time Calculation (min): 25 min  PT Assessment / Plan / Recommendation Comments on Treatment Session  Performed ambulation, exercises, steps. All education completed. Discussed car transfer. No further questions/concerns from pt/wife. Ready for d/c home. Recommend continued therapy with HHPT.     Follow Up Recommendations  Home health PT     Does the patient have the potential to tolerate intense rehabilitation     Barriers to Discharge        Equipment Recommendations       Recommendations for Other Services OT consult  Frequency 7X/week   Plan Discharge plan remains appropriate    Precautions / Restrictions Precautions Precautions: None Restrictions Weight Bearing Restrictions: No RLE Weight Bearing: Weight bearing as tolerated   Pertinent Vitals/Pain 4-5/10 R anterior hip    Mobility  Bed Mobility Bed Mobility: Supine to Sit Supine to Sit: 5: Supervision Details for Bed Mobility Assistance: Pt able to gert R LE off bed. Discussed techniques (leg lifter, sheet, wife assisting) for getting LE back onto bed.  Transfers Transfers: Sit to Stand;Stand to Sit Sit to Stand: 5: Supervision;From bed Stand to Sit: 5: Supervision;To chair/3-in-1 Details for Transfer Assistance: VCs safety, hand placement Ambulation/Gait Ambulation/Gait Assistance: 5: Supervision Ambulation Distance (Feet): 150 Feet Assistive device: Rolling walker Ambulation/Gait Assistance Details: VCs safety. Encouraged pt to DF foot during swing through and to be aware of R foot at all times since R toes/forefoot drags intermittently.  Gait Pattern: Decreased stride length;Decreased step length - right;Step-through pattern;Antalgic;Trunk flexed Stairs: Yes Stairs Assistance: 4: Min guard Stairs Assistance Details (indicate cue type and reason): VCs safety,  technique, sequence. Wife present. Issued crutches ( 1 crutch for stair negotiation only).  Stair Management Technique: One rail Right;With crutches;Forwards;Step to pattern Number of Stairs: 2     Exercises Total Joint Exercises Ankle Circles/Pumps: AROM;Both;10 reps;Supine Quad Sets: AROM;Both;10 reps;Supine Short Arc Quad: AROM;Right;10 reps;Supine Heel Slides: AAROM;Right;10 reps;Supine Hip ABduction/ADduction: AAROM;Right;10 reps;Supine   PT Diagnosis:    PT Problem List:   PT Treatment Interventions:     PT Goals Acute Rehab PT Goals Pt will go Supine/Side to Sit: with supervision PT Goal: Supine/Side to Sit - Progress: Met Pt will go Sit to Stand: with supervision PT Goal: Sit to Stand - Progress: Met Pt will Ambulate: 51 - 150 feet;with supervision;with rolling walker PT Goal: Ambulate - Progress: Met Pt will Go Up / Down Stairs: 3-5 stairs;with min assist;with least restrictive assistive device PT Goal: Up/Down Stairs - Progress: Partly met  Visit Information  Last PT Received On: 12/12/12 Assistance Needed: +1    Subjective Data  Subjective: "Ive been going to the bathroom by myself" Patient Stated Goal: Home   Cognition  Overall Cognitive Status: Appears within functional limits for tasks assessed/performed Arousal/Alertness: Awake/alert Orientation Level: Appears intact for tasks assessed Behavior During Session: [facility] for tasks performed    Balance     End of Session PT - End of Session Equipment Utilized During Treatment: Gait belt Activity Tolerance: Patient tolerated treatment well Patient left: in chair;with call bell/phone within reach;with family/visitor present   GP     Rebeca Alert [facility] 12/12/2012, 10:42 AM [phone]

## 2012-12-12 NOTE — Addendum Note (Signed)
Addendum  created 12/12/12 0352 by Illene Silver, CRNA   Modules edited:Anesthesia Flowsheet

## 2012-12-12 NOTE — Addendum Note (Signed)
Addendum  created 12/12/12 0352 by Kleigh Hoelzer E Ferdinando Lodge, CRNA   Modules edited:Anesthesia Flowsheet    

## 2012-12-12 NOTE — Progress Notes (Signed)
Inpatient Diabetes Program Recommendations  AACE/ADA: New Consensus Statement on Inpatient Glycemic Control (2013)  Target Ranges:  Prepandial:   less than 140 mg/dL      Peak postprandial:   less than 180 mg/dL (1-2 hours)      Critically ill patients:  140 - 180 mg/dL   Results for James Zimmerman, James Zimmerman (MRN [id number]) as of 12/12/2012 10:09  Ref. Range 12/10/2012 22:14 12/11/2012 08:06 12/11/2012 12:07 12/11/2012 16:52 12/11/2012 22:06 12/12/2012 07:22  Glucose-Capillary Latest Range: 70-99 mg/dL 086 (H) 578 (H) 469 (H) 137 (H) 190 (H) 149 (H)    Inpatient Diabetes Program Recommendations HgbA1C: Check HgbA1C to assess pre-hospital glycemic control  Thank you.  Ailene Ards, RD, LDN, CDE Inpatient Diabetes Coordinator [phone]

## 2012-12-12 NOTE — Progress Notes (Signed)
Patient had an episode of bright red blood in urine this morning around 555am small amount. Lafawn Lenoir RN

## 2012-12-14 DIAGNOSIS — I129 Hypertensive chronic kidney disease with stage 1 through stage 4 chronic kidney disease, or unspecified chronic kidney disease: Secondary | ICD-10-CM | POA: Diagnosis not present

## 2012-12-14 DIAGNOSIS — Z96649 Presence of unspecified artificial hip joint: Secondary | ICD-10-CM | POA: Diagnosis not present

## 2012-12-14 DIAGNOSIS — N189 Chronic kidney disease, unspecified: Secondary | ICD-10-CM | POA: Diagnosis not present

## 2012-12-14 DIAGNOSIS — E119 Type 2 diabetes mellitus without complications: Secondary | ICD-10-CM | POA: Diagnosis not present

## 2012-12-14 DIAGNOSIS — Z471 Aftercare following joint replacement surgery: Secondary | ICD-10-CM | POA: Diagnosis not present

## 2012-12-16 DIAGNOSIS — Z96649 Presence of unspecified artificial hip joint: Secondary | ICD-10-CM | POA: Diagnosis not present

## 2012-12-16 DIAGNOSIS — E119 Type 2 diabetes mellitus without complications: Secondary | ICD-10-CM | POA: Diagnosis not present

## 2012-12-16 DIAGNOSIS — N049 Nephrotic syndrome with unspecified morphologic changes: Secondary | ICD-10-CM | POA: Diagnosis not present

## 2012-12-16 DIAGNOSIS — N189 Chronic kidney disease, unspecified: Secondary | ICD-10-CM | POA: Diagnosis not present

## 2012-12-16 DIAGNOSIS — I129 Hypertensive chronic kidney disease with stage 1 through stage 4 chronic kidney disease, or unspecified chronic kidney disease: Secondary | ICD-10-CM | POA: Diagnosis not present

## 2012-12-16 DIAGNOSIS — Z471 Aftercare following joint replacement surgery: Secondary | ICD-10-CM | POA: Diagnosis not present

## 2012-12-16 DIAGNOSIS — Q613 Polycystic kidney, unspecified: Secondary | ICD-10-CM | POA: Diagnosis not present

## 2012-12-17 DIAGNOSIS — I129 Hypertensive chronic kidney disease with stage 1 through stage 4 chronic kidney disease, or unspecified chronic kidney disease: Secondary | ICD-10-CM | POA: Diagnosis not present

## 2012-12-17 DIAGNOSIS — Z471 Aftercare following joint replacement surgery: Secondary | ICD-10-CM | POA: Diagnosis not present

## 2012-12-17 DIAGNOSIS — E119 Type 2 diabetes mellitus without complications: Secondary | ICD-10-CM | POA: Diagnosis not present

## 2012-12-17 DIAGNOSIS — Z96649 Presence of unspecified artificial hip joint: Secondary | ICD-10-CM | POA: Diagnosis not present

## 2012-12-17 DIAGNOSIS — N189 Chronic kidney disease, unspecified: Secondary | ICD-10-CM | POA: Diagnosis not present

## 2012-12-19 DIAGNOSIS — I129 Hypertensive chronic kidney disease with stage 1 through stage 4 chronic kidney disease, or unspecified chronic kidney disease: Secondary | ICD-10-CM | POA: Diagnosis not present

## 2012-12-19 DIAGNOSIS — N189 Chronic kidney disease, unspecified: Secondary | ICD-10-CM | POA: Diagnosis not present

## 2012-12-19 DIAGNOSIS — Z96649 Presence of unspecified artificial hip joint: Secondary | ICD-10-CM | POA: Diagnosis not present

## 2012-12-19 DIAGNOSIS — Z471 Aftercare following joint replacement surgery: Secondary | ICD-10-CM | POA: Diagnosis not present

## 2012-12-19 DIAGNOSIS — E119 Type 2 diabetes mellitus without complications: Secondary | ICD-10-CM | POA: Diagnosis not present

## 2012-12-19 NOTE — Discharge Summary (Signed)
Physician Discharge Summary  Patient ID: James Zimmerman MRN: [id number] DOB/AGE: [birth year] 61 y.o.  Admit date: 12/10/2012 Discharge date: 12/12/2012   Procedures:  Procedure(s) (LRB): TOTAL HIP ARTHROPLASTY ANTERIOR APPROACH (Right)  Attending Physician:  Dr. Durene Romans   Admission Diagnoses:   Right hip OA / pain  Discharge Diagnoses:  Principal Problem:  *S/P right THA, AA Active Problems:  Expected blood loss anemia  Obese  Hyponatremia  Diabetes Diabetes mellitus   H/O polycystic kidney disease   Hypertension   Hyperlipidemia   GERD   Anxiety  Chronic kidney disease   Kidney transplant as cause of abnormal reaction or later complication   Arthritis   Cancer - hx of skin cancer   Diverticulitis    HPI: James Zimmerman, 61 y.o. male, has a history of pain and functional disability in the right hip(s) due to arthritis and patient has failed non-surgical conservative treatments for greater than 12 weeks to include corticosteriod injections, use of assistive devices and activity modification. Onset of symptoms was gradual starting 2 years ago with gradually worsening course since that time.The patient noted no past surgery on the right hip(s). Patient currently rates pain in the right hip at 8 out of 10 with activity. Patient has worsening of pain with activity and weight bearing, trendelenberg gait, pain that interfers with activities of daily living and pain with passive range of motion. Patient has evidence of periarticular osteophytes and joint space narrowing by imaging studies. This condition presents safety issues increasing the risk of falls. There is no current active infection. Risks, benefits and expectations were discussed with the patient. Patient understand the risks, benefits and expectations and wishes to proceed with surgery.   PCP: Lenora Boys, MD   Discharged Condition: good  Hospital Course:  Patient underwent the above stated procedure  on 12/10/2012. Patient tolerated the procedure well and brought to the recovery room in good condition and subsequently to the floor.  POD #1 BP: 115/67 ; Pulse: 67 ; Temp: 97.8 F (36.6 C) ; Resp: 16  Pt's foley was removed, as well as the hemovac drain removed. IV was changed to a saline lock. Patient reports pain as mild, pain well controlled. No events throughout the night. Ready for discharge if he does well with PT. Neurovascular intact, dorsiflexion/plantar flexion intact, incision: dressing C/D/I, no cellulitis present and compartment soft.   LABS  Basename  12/11/12 0453   HGB  12.0  HCT  35.9   POD #2  BP: 127/59 ; Pulse: 79 ; Temp: 98.7 F (37.1 C) ; Resp: 20  Patient reports pain as mild. Doing better today, ready to head home. Concerned with blood in urine particularly due to history of renal transplant. Neurovascular intact, dorsiflexion/plantar flexion intact, incision: dressing C/D/I, no cellulitis present and compartment soft.   LABS  Basename  12/12/12 0420  HGB  11.0  HCT  32.3    Discharge Exam: General appearance: alert, cooperative and no distress Extremities: Homans sign is negative, no sign of DVT, no edema, redness or tenderness in the calves or thighs and no ulcers, gangrene or trophic changes  Disposition:   Home or Self Care with follow up in 2 weeks   Follow-up Information    Follow up with Shelda Pal, MD. Schedule an appointment as soon as possible for a visit in 2 weeks.   Contact information:   [address] [place] [address] [phone]  Discharge Orders    Future Orders Please Complete By Expires   Diet - low sodium heart healthy      Call MD / Call 911      Comments:   If you experience chest pain or shortness of breath, CALL 911 and be transported to the hospital emergency room.  If you develope a fever above 101 F, pus (white drainage) or increased drainage or redness at the wound, or calf pain, call  your surgeon's office.   Discharge instructions      Comments:   Maintain surgical dressing for 10-14 days, then replace with gauze and tape. Keep the area dry and clean until follow up. Follow up in 2 weeks at [facility]. Call with any questions or concerns.   Constipation Prevention      Comments:   Drink plenty of fluids.  Prune juice may be helpful.  You may use a stool softener, such as Colace (over the counter) 100 mg twice a day.  Use MiraLax (over the counter) for constipation as needed.   Increase activity slowly as tolerated      Driving restrictions      Comments:   No driving for 4 weeks   Change dressing      Comments:   Maintain surgical dressing for 10-14 days, then replace with 4x4 guaze and tape. Keep the area dry and clean.   TED hose      Comments:   Use stockings (TED hose) for 2 weeks on both leg(s).  You may remove them at night for sleeping.      Discharge Medication List as of 12/11/2012 10:44 AM    START taking these medications   Details  aspirin EC 325 MG tablet Take 1 tablet (325 mg total) by mouth 2 (two) times daily. X 4 weeks, Starting 12/11/2012, Until Discontinued, No Print    diphenhydrAMINE (BENADRYL) 25 mg capsule Take 1 capsule (25 mg total) by mouth every 6 (six) hours as needed for itching, allergies or sleep., Starting 12/11/2012, Until Discontinued, No Print    docusate sodium 100 MG CAPS Take 100 mg by mouth 2 (two) times daily., Starting 12/11/2012, Until Discontinued, No Print    ferrous sulfate 325 (65 FE) MG tablet Take 1 tablet (325 mg total) by mouth 3 (three) times daily after meals., Starting 12/11/2012, Until Discontinued, No Print    HYDROcodone-acetaminophen (NORCO) 7.5-325 MG per tablet Take 1-2 tablets by mouth every 4 (four) hours as needed for pain., Starting 12/11/2012, Until Discontinued, Print    methocarbamol (ROBAXIN) 500 MG tablet Take 1 tablet (500 mg total) by mouth every 6 (six) hours as needed (muscle  spasms)., Starting 12/11/2012, Until Discontinued, Print    polyethylene glycol (MIRALAX / GLYCOLAX) packet Take 17 g by mouth 2 (two) times daily., Starting 12/11/2012, Until Discontinued, No Print      CONTINUE these medications which have NOT CHANGED   Details  allopurinol (ZYLOPRIM) 100 MG tablet Take 200 mg by mouth at bedtime. , Until Discontinued, Historical Med    amLODipine (NORVASC) 2.5 MG tablet Take 2.5 mg by mouth every morning. , Until Discontinued, Historical Med    Chlorphen-Phenyleph-ASA (ALKA-SELTZER PLUS COLD) 2-7.8-325 MG TBEF Take 2 tablets by mouth every 6 (six) hours as needed. For cold when needed, Until Discontinued, Historical Med    cholecalciferol (VITAMIN D) 1000 UNITS tablet Take 1,000 Units by mouth daily., Until Discontinued, Historical Med    ezetimibe (ZETIA) 10 MG tablet Take 10 mg by  mouth daily., Until Discontinued, Historical Med    FLUoxetine (PROZAC) 40 MG capsule Take 40 mg by mouth daily. Patient takes in am, Until Discontinued, Historical Med    metoprolol (LOPRESSOR) 50 MG tablet Take 50 mg by mouth 2 (two) times daily., Until Discontinued, Historical Med    mycophenolate (CELLCEPT) 250 MG capsule Take 250 mg by mouth 2 (two) times daily. , Until Discontinued, Historical Med    pantoprazole (PROTONIX) 40 MG tablet Take 40 mg by mouth 2 (two) times daily. , Until Discontinued, Historical Med    pioglitazone (ACTOS) 30 MG tablet Take 30 mg by mouth daily., Until Discontinued, Historical Med    predniSONE (DELTASONE) 5 MG tablet Take 5 mg by mouth daily., Until Discontinued, Historical Med    tacrolimus (PROGRAF) 1 MG capsule Take 2 mg by mouth 2 (two) times daily. , Until Discontinued, Historical Med    vitamin B-12 (CYANOCOBALAMIN) 1000 MCG tablet Take 1,000 mcg by mouth daily., Until Discontinued, Historical Med    ALPRAZolam (XANAX) 0.25 MG tablet Take 0.25 mg by mouth at bedtime as needed. For anxiety, Until Discontinued, Historical Med        STOP taking these medications     traMADol (ULTRAM) 50 MG tablet Comments:  Reason for Stopping:           Signed: Anastasio Zimmerman. Mionna Advincula   PAC  12/19/2012, 9:48 AM

## 2012-12-20 DIAGNOSIS — Z96649 Presence of unspecified artificial hip joint: Secondary | ICD-10-CM | POA: Diagnosis not present

## 2012-12-20 DIAGNOSIS — Z471 Aftercare following joint replacement surgery: Secondary | ICD-10-CM | POA: Diagnosis not present

## 2012-12-20 DIAGNOSIS — E119 Type 2 diabetes mellitus without complications: Secondary | ICD-10-CM | POA: Diagnosis not present

## 2012-12-20 DIAGNOSIS — I129 Hypertensive chronic kidney disease with stage 1 through stage 4 chronic kidney disease, or unspecified chronic kidney disease: Secondary | ICD-10-CM | POA: Diagnosis not present

## 2012-12-20 DIAGNOSIS — N189 Chronic kidney disease, unspecified: Secondary | ICD-10-CM | POA: Diagnosis not present

## 2012-12-24 DIAGNOSIS — E119 Type 2 diabetes mellitus without complications: Secondary | ICD-10-CM | POA: Diagnosis not present

## 2012-12-24 DIAGNOSIS — Z96649 Presence of unspecified artificial hip joint: Secondary | ICD-10-CM | POA: Diagnosis not present

## 2012-12-24 DIAGNOSIS — N189 Chronic kidney disease, unspecified: Secondary | ICD-10-CM | POA: Diagnosis not present

## 2012-12-24 DIAGNOSIS — Z471 Aftercare following joint replacement surgery: Secondary | ICD-10-CM | POA: Diagnosis not present

## 2012-12-24 DIAGNOSIS — I129 Hypertensive chronic kidney disease with stage 1 through stage 4 chronic kidney disease, or unspecified chronic kidney disease: Secondary | ICD-10-CM | POA: Diagnosis not present

## 2012-12-26 DIAGNOSIS — I129 Hypertensive chronic kidney disease with stage 1 through stage 4 chronic kidney disease, or unspecified chronic kidney disease: Secondary | ICD-10-CM | POA: Diagnosis not present

## 2012-12-26 DIAGNOSIS — Z471 Aftercare following joint replacement surgery: Secondary | ICD-10-CM | POA: Diagnosis not present

## 2012-12-26 DIAGNOSIS — Z96649 Presence of unspecified artificial hip joint: Secondary | ICD-10-CM | POA: Diagnosis not present

## 2012-12-26 DIAGNOSIS — E119 Type 2 diabetes mellitus without complications: Secondary | ICD-10-CM | POA: Diagnosis not present

## 2012-12-26 DIAGNOSIS — N189 Chronic kidney disease, unspecified: Secondary | ICD-10-CM | POA: Diagnosis not present

## 2012-12-27 DIAGNOSIS — I129 Hypertensive chronic kidney disease with stage 1 through stage 4 chronic kidney disease, or unspecified chronic kidney disease: Secondary | ICD-10-CM | POA: Diagnosis not present

## 2012-12-27 DIAGNOSIS — Z96649 Presence of unspecified artificial hip joint: Secondary | ICD-10-CM | POA: Diagnosis not present

## 2012-12-27 DIAGNOSIS — E119 Type 2 diabetes mellitus without complications: Secondary | ICD-10-CM | POA: Diagnosis not present

## 2012-12-27 DIAGNOSIS — N189 Chronic kidney disease, unspecified: Secondary | ICD-10-CM | POA: Diagnosis not present

## 2012-12-27 DIAGNOSIS — Z471 Aftercare following joint replacement surgery: Secondary | ICD-10-CM | POA: Diagnosis not present

## 2012-12-30 DIAGNOSIS — N189 Chronic kidney disease, unspecified: Secondary | ICD-10-CM | POA: Diagnosis not present

## 2012-12-30 DIAGNOSIS — Z96649 Presence of unspecified artificial hip joint: Secondary | ICD-10-CM | POA: Diagnosis not present

## 2012-12-30 DIAGNOSIS — I129 Hypertensive chronic kidney disease with stage 1 through stage 4 chronic kidney disease, or unspecified chronic kidney disease: Secondary | ICD-10-CM | POA: Diagnosis not present

## 2012-12-30 DIAGNOSIS — E119 Type 2 diabetes mellitus without complications: Secondary | ICD-10-CM | POA: Diagnosis not present

## 2012-12-30 DIAGNOSIS — Z471 Aftercare following joint replacement surgery: Secondary | ICD-10-CM | POA: Diagnosis not present

## 2013-01-01 DIAGNOSIS — Z96649 Presence of unspecified artificial hip joint: Secondary | ICD-10-CM | POA: Diagnosis not present

## 2013-01-01 DIAGNOSIS — Z471 Aftercare following joint replacement surgery: Secondary | ICD-10-CM | POA: Diagnosis not present

## 2013-01-01 DIAGNOSIS — I129 Hypertensive chronic kidney disease with stage 1 through stage 4 chronic kidney disease, or unspecified chronic kidney disease: Secondary | ICD-10-CM | POA: Diagnosis not present

## 2013-01-01 DIAGNOSIS — E119 Type 2 diabetes mellitus without complications: Secondary | ICD-10-CM | POA: Diagnosis not present

## 2013-01-01 DIAGNOSIS — N189 Chronic kidney disease, unspecified: Secondary | ICD-10-CM | POA: Diagnosis not present

## 2013-01-03 DIAGNOSIS — Z96649 Presence of unspecified artificial hip joint: Secondary | ICD-10-CM | POA: Diagnosis not present

## 2013-01-03 DIAGNOSIS — E119 Type 2 diabetes mellitus without complications: Secondary | ICD-10-CM | POA: Diagnosis not present

## 2013-01-03 DIAGNOSIS — Z471 Aftercare following joint replacement surgery: Secondary | ICD-10-CM | POA: Diagnosis not present

## 2013-01-03 DIAGNOSIS — I129 Hypertensive chronic kidney disease with stage 1 through stage 4 chronic kidney disease, or unspecified chronic kidney disease: Secondary | ICD-10-CM | POA: Diagnosis not present

## 2013-01-03 DIAGNOSIS — N189 Chronic kidney disease, unspecified: Secondary | ICD-10-CM | POA: Diagnosis not present

## 2013-01-08 DIAGNOSIS — Z96649 Presence of unspecified artificial hip joint: Secondary | ICD-10-CM | POA: Diagnosis not present

## 2013-01-10 DIAGNOSIS — Z940 Kidney transplant status: Secondary | ICD-10-CM | POA: Diagnosis not present

## 2013-01-10 DIAGNOSIS — N183 Chronic kidney disease, stage 3 unspecified: Secondary | ICD-10-CM | POA: Diagnosis not present

## 2013-01-10 DIAGNOSIS — E119 Type 2 diabetes mellitus without complications: Secondary | ICD-10-CM | POA: Diagnosis not present

## 2013-01-10 DIAGNOSIS — Z79899 Other long term (current) drug therapy: Secondary | ICD-10-CM | POA: Diagnosis not present

## 2013-01-14 DIAGNOSIS — Z79899 Other long term (current) drug therapy: Secondary | ICD-10-CM | POA: Diagnosis not present

## 2013-01-14 DIAGNOSIS — Z940 Kidney transplant status: Secondary | ICD-10-CM | POA: Diagnosis not present

## 2013-01-14 DIAGNOSIS — M109 Gout, unspecified: Secondary | ICD-10-CM | POA: Diagnosis not present

## 2013-01-14 DIAGNOSIS — E119 Type 2 diabetes mellitus without complications: Secondary | ICD-10-CM | POA: Diagnosis not present

## 2013-02-26 DIAGNOSIS — H409 Unspecified glaucoma: Secondary | ICD-10-CM | POA: Diagnosis not present

## 2013-02-26 DIAGNOSIS — H401290 Low-tension glaucoma, unspecified eye, stage unspecified: Secondary | ICD-10-CM | POA: Diagnosis not present

## 2013-03-13 DIAGNOSIS — J329 Chronic sinusitis, unspecified: Secondary | ICD-10-CM | POA: Diagnosis not present

## 2013-03-19 DIAGNOSIS — Z96649 Presence of unspecified artificial hip joint: Secondary | ICD-10-CM | POA: Diagnosis not present

## 2013-03-27 DIAGNOSIS — I10 Essential (primary) hypertension: Secondary | ICD-10-CM | POA: Diagnosis not present

## 2013-03-27 DIAGNOSIS — Z940 Kidney transplant status: Secondary | ICD-10-CM | POA: Diagnosis not present

## 2013-03-27 DIAGNOSIS — Z79899 Other long term (current) drug therapy: Secondary | ICD-10-CM | POA: Diagnosis not present

## 2013-03-27 DIAGNOSIS — E119 Type 2 diabetes mellitus without complications: Secondary | ICD-10-CM | POA: Diagnosis not present

## 2013-03-31 DIAGNOSIS — H4011X0 Primary open-angle glaucoma, stage unspecified: Secondary | ICD-10-CM | POA: Diagnosis not present

## 2013-03-31 DIAGNOSIS — H409 Unspecified glaucoma: Secondary | ICD-10-CM | POA: Diagnosis not present

## 2013-06-04 DIAGNOSIS — H2510 Age-related nuclear cataract, unspecified eye: Secondary | ICD-10-CM | POA: Diagnosis not present

## 2013-06-20 DIAGNOSIS — H2510 Age-related nuclear cataract, unspecified eye: Secondary | ICD-10-CM | POA: Diagnosis not present

## 2013-06-23 DIAGNOSIS — Z125 Encounter for screening for malignant neoplasm of prostate: Secondary | ICD-10-CM | POA: Diagnosis not present

## 2013-06-23 DIAGNOSIS — Z79899 Other long term (current) drug therapy: Secondary | ICD-10-CM | POA: Diagnosis not present

## 2013-06-23 DIAGNOSIS — E119 Type 2 diabetes mellitus without complications: Secondary | ICD-10-CM | POA: Diagnosis not present

## 2013-06-23 DIAGNOSIS — Z940 Kidney transplant status: Secondary | ICD-10-CM | POA: Diagnosis not present

## 2013-06-23 DIAGNOSIS — E785 Hyperlipidemia, unspecified: Secondary | ICD-10-CM | POA: Diagnosis not present

## 2013-06-25 DIAGNOSIS — E119 Type 2 diabetes mellitus without complications: Secondary | ICD-10-CM | POA: Diagnosis not present

## 2013-06-25 DIAGNOSIS — N183 Chronic kidney disease, stage 3 unspecified: Secondary | ICD-10-CM | POA: Diagnosis not present

## 2013-06-25 DIAGNOSIS — Z79899 Other long term (current) drug therapy: Secondary | ICD-10-CM | POA: Diagnosis not present

## 2013-06-25 DIAGNOSIS — Z940 Kidney transplant status: Secondary | ICD-10-CM | POA: Diagnosis not present

## 2013-07-30 DIAGNOSIS — R972 Elevated prostate specific antigen [PSA]: Secondary | ICD-10-CM | POA: Diagnosis not present

## 2013-07-30 DIAGNOSIS — N138 Other obstructive and reflux uropathy: Secondary | ICD-10-CM | POA: Diagnosis not present

## 2013-07-30 DIAGNOSIS — N401 Enlarged prostate with lower urinary tract symptoms: Secondary | ICD-10-CM | POA: Diagnosis not present

## 2013-07-30 DIAGNOSIS — R351 Nocturia: Secondary | ICD-10-CM | POA: Diagnosis not present

## 2013-08-21 DIAGNOSIS — H2510 Age-related nuclear cataract, unspecified eye: Secondary | ICD-10-CM | POA: Diagnosis not present

## 2013-09-08 DIAGNOSIS — H2589 Other age-related cataract: Secondary | ICD-10-CM | POA: Diagnosis not present

## 2013-09-08 DIAGNOSIS — H269 Unspecified cataract: Secondary | ICD-10-CM | POA: Diagnosis not present

## 2013-09-08 DIAGNOSIS — H2510 Age-related nuclear cataract, unspecified eye: Secondary | ICD-10-CM | POA: Diagnosis not present

## 2013-09-24 DIAGNOSIS — H2510 Age-related nuclear cataract, unspecified eye: Secondary | ICD-10-CM | POA: Diagnosis not present

## 2013-09-25 DIAGNOSIS — Z940 Kidney transplant status: Secondary | ICD-10-CM | POA: Diagnosis not present

## 2013-09-25 DIAGNOSIS — I10 Essential (primary) hypertension: Secondary | ICD-10-CM | POA: Diagnosis not present

## 2013-09-25 DIAGNOSIS — E119 Type 2 diabetes mellitus without complications: Secondary | ICD-10-CM | POA: Diagnosis not present

## 2013-09-25 DIAGNOSIS — Z79899 Other long term (current) drug therapy: Secondary | ICD-10-CM | POA: Diagnosis not present

## 2013-09-25 DIAGNOSIS — IMO0001 Reserved for inherently not codable concepts without codable children: Secondary | ICD-10-CM | POA: Diagnosis not present

## 2013-09-29 DIAGNOSIS — H2589 Other age-related cataract: Secondary | ICD-10-CM | POA: Diagnosis not present

## 2013-09-29 DIAGNOSIS — H269 Unspecified cataract: Secondary | ICD-10-CM | POA: Diagnosis not present

## 2013-09-29 DIAGNOSIS — H2510 Age-related nuclear cataract, unspecified eye: Secondary | ICD-10-CM | POA: Diagnosis not present

## 2014-01-23 DIAGNOSIS — I10 Essential (primary) hypertension: Secondary | ICD-10-CM | POA: Diagnosis not present

## 2014-01-23 DIAGNOSIS — N183 Chronic kidney disease, stage 3 unspecified: Secondary | ICD-10-CM | POA: Diagnosis not present

## 2014-01-23 DIAGNOSIS — E139 Other specified diabetes mellitus without complications: Secondary | ICD-10-CM | POA: Diagnosis not present

## 2014-02-13 DIAGNOSIS — E119 Type 2 diabetes mellitus without complications: Secondary | ICD-10-CM | POA: Diagnosis not present

## 2014-02-13 DIAGNOSIS — R112 Nausea with vomiting, unspecified: Secondary | ICD-10-CM | POA: Diagnosis not present

## 2014-03-23 DIAGNOSIS — H4011X0 Primary open-angle glaucoma, stage unspecified: Secondary | ICD-10-CM | POA: Diagnosis not present

## 2014-03-23 DIAGNOSIS — H409 Unspecified glaucoma: Secondary | ICD-10-CM | POA: Diagnosis not present

## 2014-04-02 DIAGNOSIS — Z79899 Other long term (current) drug therapy: Secondary | ICD-10-CM | POA: Diagnosis not present

## 2014-04-02 DIAGNOSIS — IMO0001 Reserved for inherently not codable concepts without codable children: Secondary | ICD-10-CM | POA: Diagnosis not present

## 2014-04-02 DIAGNOSIS — Z940 Kidney transplant status: Secondary | ICD-10-CM | POA: Diagnosis not present

## 2014-04-02 DIAGNOSIS — E119 Type 2 diabetes mellitus without complications: Secondary | ICD-10-CM | POA: Diagnosis not present

## 2014-04-02 DIAGNOSIS — I10 Essential (primary) hypertension: Secondary | ICD-10-CM | POA: Diagnosis not present

## 2014-06-10 ENCOUNTER — Other Ambulatory Visit: Payer: Self-pay | Admitting: Gastroenterology

## 2014-06-10 DIAGNOSIS — Z860100 Personal history of colon polyps, unspecified: Secondary | ICD-10-CM | POA: Diagnosis not present

## 2014-06-10 DIAGNOSIS — D126 Benign neoplasm of colon, unspecified: Secondary | ICD-10-CM | POA: Diagnosis not present

## 2014-06-10 DIAGNOSIS — K648 Other hemorrhoids: Secondary | ICD-10-CM | POA: Diagnosis not present

## 2014-06-10 DIAGNOSIS — K5730 Diverticulosis of large intestine without perforation or abscess without bleeding: Secondary | ICD-10-CM | POA: Diagnosis not present

## 2014-06-10 DIAGNOSIS — Z09 Encounter for follow-up examination after completed treatment for conditions other than malignant neoplasm: Secondary | ICD-10-CM | POA: Diagnosis not present

## 2014-06-10 HISTORY — PX: COLONOSCOPY: SHX174

## 2014-07-07 DIAGNOSIS — E139 Other specified diabetes mellitus without complications: Secondary | ICD-10-CM | POA: Diagnosis not present

## 2014-07-07 DIAGNOSIS — Z940 Kidney transplant status: Secondary | ICD-10-CM | POA: Diagnosis not present

## 2014-07-10 DIAGNOSIS — E785 Hyperlipidemia, unspecified: Secondary | ICD-10-CM | POA: Diagnosis not present

## 2014-07-10 DIAGNOSIS — E139 Other specified diabetes mellitus without complications: Secondary | ICD-10-CM | POA: Diagnosis not present

## 2014-07-10 DIAGNOSIS — N183 Chronic kidney disease, stage 3 unspecified: Secondary | ICD-10-CM | POA: Diagnosis not present

## 2014-07-10 DIAGNOSIS — I10 Essential (primary) hypertension: Secondary | ICD-10-CM | POA: Diagnosis not present

## 2014-07-14 DIAGNOSIS — Z79899 Other long term (current) drug therapy: Secondary | ICD-10-CM | POA: Diagnosis not present

## 2014-08-03 DIAGNOSIS — Z79899 Other long term (current) drug therapy: Secondary | ICD-10-CM | POA: Diagnosis not present

## 2014-08-07 DIAGNOSIS — H409 Unspecified glaucoma: Secondary | ICD-10-CM | POA: Diagnosis not present

## 2014-08-07 DIAGNOSIS — H52229 Regular astigmatism, unspecified eye: Secondary | ICD-10-CM | POA: Diagnosis not present

## 2014-08-07 DIAGNOSIS — E119 Type 2 diabetes mellitus without complications: Secondary | ICD-10-CM | POA: Diagnosis not present

## 2014-08-07 DIAGNOSIS — H4011X0 Primary open-angle glaucoma, stage unspecified: Secondary | ICD-10-CM | POA: Diagnosis not present

## 2014-08-09 ENCOUNTER — Encounter (HOSPITAL_COMMUNITY): Payer: Self-pay | Admitting: Emergency Medicine

## 2014-08-09 ENCOUNTER — Emergency Department (HOSPITAL_COMMUNITY)
Admission: EM | Admit: 2014-08-09 | Discharge: 2014-08-09 | Disposition: A | Discharge status: Home / Self Care | Payer: Managed Care, Other (non HMO) | Attending: Emergency Medicine | Admitting: Emergency Medicine

## 2014-08-09 DIAGNOSIS — M129 Arthropathy, unspecified: Secondary | ICD-10-CM | POA: Diagnosis not present

## 2014-08-09 DIAGNOSIS — H1130 Conjunctival hemorrhage, unspecified eye: Secondary | ICD-10-CM | POA: Diagnosis not present

## 2014-08-09 DIAGNOSIS — Z87891 Personal history of nicotine dependence: Secondary | ICD-10-CM | POA: Diagnosis not present

## 2014-08-09 DIAGNOSIS — I129 Hypertensive chronic kidney disease with stage 1 through stage 4 chronic kidney disease, or unspecified chronic kidney disease: Secondary | ICD-10-CM | POA: Insufficient documentation

## 2014-08-09 DIAGNOSIS — Z79899 Other long term (current) drug therapy: Secondary | ICD-10-CM | POA: Diagnosis not present

## 2014-08-09 DIAGNOSIS — H5789 Other specified disorders of eye and adnexa: Secondary | ICD-10-CM | POA: Insufficient documentation

## 2014-08-09 DIAGNOSIS — IMO0002 Reserved for concepts with insufficient information to code with codable children: Secondary | ICD-10-CM | POA: Diagnosis not present

## 2014-08-09 DIAGNOSIS — H1132 Conjunctival hemorrhage, left eye: Secondary | ICD-10-CM

## 2014-08-09 DIAGNOSIS — Z85828 Personal history of other malignant neoplasm of skin: Secondary | ICD-10-CM | POA: Diagnosis not present

## 2014-08-09 DIAGNOSIS — E119 Type 2 diabetes mellitus without complications: Secondary | ICD-10-CM | POA: Insufficient documentation

## 2014-08-09 DIAGNOSIS — F411 Generalized anxiety disorder: Secondary | ICD-10-CM | POA: Insufficient documentation

## 2014-08-09 DIAGNOSIS — Z940 Kidney transplant status: Secondary | ICD-10-CM | POA: Diagnosis not present

## 2014-08-09 DIAGNOSIS — K219 Gastro-esophageal reflux disease without esophagitis: Secondary | ICD-10-CM | POA: Insufficient documentation

## 2014-08-09 DIAGNOSIS — Z880 Allergy status to penicillin: Secondary | ICD-10-CM | POA: Insufficient documentation

## 2014-08-09 NOTE — Discharge Instructions (Signed)
Subconjunctival Hemorrhage °A subconjunctival hemorrhage is a bright red patch covering a portion of the white of the eye. The white part of the eye is called the sclera, and it is covered by a thin membrane called the conjunctiva. This membrane is clear, except for tiny blood vessels that you can see with the naked eye. When your eye is irritated or inflamed and becomes red, it is because the vessels in the conjunctiva are swollen. °Sometimes, a blood vessel in the conjunctiva can break and bleed. When this occurs, the blood builds up between the conjunctiva and the sclera, and spreads out to create a red area. The red spot may be very small at first. It may then spread to cover a larger part of the surface of the eye, or even all of the visible white part of the eye. °In almost all cases, the blood will go away and the eye will become white again. Before completely dissolving, however, the red area may spread. It may also become brownish-yellow in color before going away. If a lot of blood collects under the conjunctiva, it may look like a bulge on the surface of the eye. This looks scary, but it will also eventually flatten out and go away. Subconjunctival hemorrhages do not cause pain, but if swollen, may cause a feeling of irritation. There is no effect on vision.  °CAUSES  °· The most common cause is mild trauma (rubbing the eye, irritation). °· Subconjunctival hemorrhages can happen because of coughing or straining (lifting heavy objects), vomiting, or sneezing. °· In some cases, your doctor may want to check your blood pressure. High blood pressure can also cause a subconjunctival hemorrhage. °· Severe trauma or blunt injuries. °· Diseases that affect blood clotting (hemophilia, leukemia). °· Abnormalities of blood vessels behind the eye (carotid cavernous sinus fistula). °· Tumors behind the eye. °· Certain drugs (aspirin, Coumadin, heparin). °· Recent eye surgery. °HOME CARE INSTRUCTIONS  °· Do not worry  about the appearance of your eye. You may continue your usual activities. °· Often, follow-up is not necessary. °SEEK MEDICAL CARE IF:  °· Your eye becomes painful. °· The bleeding does not disappear within 3 weeks. °· Bleeding occurs elsewhere, for example, under the skin, in the mouth, or in the other eye. °· You have recurring subconjunctival hemorrhages. °SEEK IMMEDIATE MEDICAL CARE IF:  °· Your vision changes or you have difficulty seeing. °· You develop a severe headache, persistent vomiting, confusion, or abnormal drowsiness (lethargy). °· Your eye seems to bulge or protrude from the eye socket. °· You notice the sudden appearance of bruises or have spontaneous bleeding elsewhere on your body. °Document Released: 12/11/2005 Document Revised: 04/27/2014 Document Reviewed: 11/08/2009 °Exit[employer]® Patient Information ©2015 [employer]. This information is not intended to replace advice given to you by your health care provider. Make sure you discuss any questions you have with your health care provider. ° °

## 2014-08-09 NOTE — ED Provider Notes (Signed)
CSN: [id number]     Arrival date & time 08/09/14  5038 History   First MD Initiated Contact with Patient 08/09/14 [phone]     Chief Complaint  Patient presents with  . Eye Problem     (Consider location/radiation/quality/duration/timing/severity/associated sxs/prior Treatment) HPI  Patient presents for a complaint of right eye noticed this morning. He used a new contact, yesterday. Previously. He did not use the contact in his left eye. He was fitted for the contact 2 days ago, at his ophthalmologist office. He reports that they had to place several different lenses, into his eye, in order to find the right one. He denies eye drainage, fever, chills, nausea, vomiting, weakness, or dizziness. He's taking his usual medications, as prescribed. He has not noticed any change in his vision. He has poor distance vision. Eye contact had been intended to improve his far vision. There are no other known modifying factors.  Past Medical History  Diagnosis Date  . Diabetes mellitus   . H/O polycystic kidney disease   . Hypertension   . Hyperlipidemia   . GERD (gastroesophageal reflux disease)   . Anxiety     crowds, closed in spaces, used to sleep, prn  . Chronic kidney disease     polycystic kidney dz.   . Pneumonia     hx of as a child   . Kidney transplant as cause of abnormal reaction or later complication     0034   . Arthritis   . Cancer     hx of skin cancer   . Diverticulitis    Past Surgical History  Procedure Laterality Date  . Kidney transplant    . Left kidney transplant  2004  . Colonoscopy    . Colonoscopy  03/19/2012    Procedure: COLONOSCOPY;  Surgeon: Winfield Cunas., MD;  Location: [facility] ENDOSCOPY;  Service: Endoscopy;  Laterality: N/A;  . Hot hemostasis  03/19/2012    Procedure: HOT HEMOSTASIS (ARGON PLASMA COAGULATION/BICAP);  Surgeon: Winfield Cunas., MD;  Location: [facility] ENDOSCOPY;  Service: Endoscopy;  Laterality: N/A;  . Total hip arthroplasty  12/10/2012   Procedure: TOTAL HIP ARTHROPLASTY ANTERIOR APPROACH;  Surgeon: Mauri Pole, MD;  Location: [facility] ORS;  Service: Orthopedics;  Laterality: Right;   Family History  Problem Relation Age of Onset  . Polycystic kidney disease    . Anesthesia problems Neg Hx   . Hypotension Neg Hx   . Malignant hyperthermia Neg Hx   . Pseudochol deficiency Neg Hx    History  Substance Use Topics  . Smoking status: Former Smoker    Quit date: 12/26/1983  . Smokeless tobacco: Never Used  . Alcohol Use: 16.8 oz/week    28 Glasses of wine per week    Review of Systems  All other systems reviewed and are negative.     Allergies  Penicillins  Home Medications   Prior to Admission medications   Medication Sig Start Date End Date Taking? Authorizing Provider  allopurinol (ZYLOPRIM) 100 MG tablet Take 200 mg by mouth at bedtime.    Yes Historical Provider, MD  ALPRAZolam Duanne Moron) 0.25 MG tablet Take 0.25 mg by mouth at bedtime as needed. For anxiety   Yes Historical Provider, MD  amLODipine (NORVASC) 2.5 MG tablet Take 2.5 mg by mouth every morning.    Yes Historical Provider, MD  bimatoprost (LUMIGAN) 0.03 % ophthalmic solution Place 1 drop into both eyes at bedtime.   Yes Historical Provider, MD  ezetimibe (ZETIA)  10 MG tablet Take 10 mg by mouth daily.   Yes Historical Provider, MD  FLUoxetine (PROZAC) 40 MG capsule Take 40 mg by mouth daily. Patient takes in am   Yes Historical Provider, MD  glipiZIDE (GLUCOTROL) 5 MG tablet Take 2.5-5 mg by mouth 2 (two) times daily before a meal. 5mg  in the am, 2.5mg  in the evening   Yes Historical Provider, MD  hydroxypropyl methylcellulose (ISOPTO TEARS) 2.5 % ophthalmic solution Place 1 drop into both eyes 3 (three) times daily as needed for dry eyes.   Yes Historical Provider, MD  metoprolol (LOPRESSOR) 50 MG tablet Take 50 mg by mouth 2 (two) times daily.   Yes Historical Provider, MD  mycophenolate (CELLCEPT) 250 MG capsule Take 250 mg by mouth 2 (two) times  daily.    Yes Historical Provider, MD  pantoprazole (PROTONIX) 40 MG tablet Take 40 mg by mouth 2 (two) times daily.    Yes Historical Provider, MD  pioglitazone (ACTOS) 30 MG tablet Take 30 mg by mouth daily.   Yes Historical Provider, MD  predniSONE (DELTASONE) 5 MG tablet Take 5 mg by mouth daily.   Yes Historical Provider, MD  tacrolimus (PROGRAF) 1 MG capsule Take 1.5 mg by mouth 2 (two) times daily.    Yes Historical Provider, MD   BP 133/90  Pulse 89  Temp(Src) 98.2 F (36.8 C) (Oral)  Resp 16  SpO2 96% Physical Exam  Nursing note and vitals reviewed. Constitutional: He is oriented to person, place, and time. He appears well-developed and well-nourished.  HENT:  Head: Normocephalic and atraumatic.  Right Ear: External ear normal.  Left Ear: External ear normal.  Eyes: Conjunctivae and EOM are normal. Pupils are equal, round, and reactive to light.  Left thigh with large subconjunctival hemorrhage, extending to the margins of the limbus, circumferentially. External ocular muscles are intact and alignment is without pain.  Neck: Normal range of motion and phonation normal. Neck supple.  Cardiovascular: Normal rate.   Pulmonary/Chest: Effort normal. He exhibits no bony tenderness.  Musculoskeletal: Normal range of motion.  Neurological: He is alert and oriented to person, place, and time. No cranial nerve deficit or sensory deficit. He exhibits normal muscle tone. Coordination normal.  Skin: Skin is warm, dry and intact.  Psychiatric: He has a normal mood and affect. His behavior is normal. Judgment and thought content normal.    ED Course  Procedures (including critical care time)  Visual acuity testing, as documented. Patient reports this is his chronic status. He, states that his near vision is as it usually is.  I discussed the case with Dr. Eliezer Bottom, on call for his ophthalmologist. He, states that the subconjunctival hemorrhage, can be treated symptomatically and  expectantly. Followup with his ophthalmologist is recommended.  Labs Review Labs Reviewed - No data to display  Imaging Review No results found.   EKG Interpretation None      MDM   Final diagnoses:  Subconjunctival hemorrhage, left    Uncomplicated MG tablet hemorrhage, likely related to the manipulation of the recent contact lens fitting. There is no evidence for acute infection or suspected elevated eye pressure.  Nursing Notes Reviewed/ Care Coordinated Applicable Imaging Reviewed Interpretation of Laboratory Data incorporated into ED treatment  The patient appears reasonably screened and/or stabilized for discharge and I doubt any other medical condition or other [facility] requiring further screening, evaluation, or treatment in the ED at this time prior to discharge.  Plan: Home Medications- usual; Home Treatments- rest, avoid  straining; return here if the recommended treatment, does not improve the symptoms; Recommended follow up- Ophth. 3-4 days     Richarda Blade, MD 08/09/14 1024

## 2014-08-09 NOTE — ED Notes (Signed)
Pt went to eye doctor on Friday for check up and was given a contact lens to wear in the left eye for his glaucoma. Pt wore contact all day yesterday and during the night pt states that felt like he had a headache on his left side and noticed swelling to left eye.  Pt denies drainage but does have pain with shifts eye from side to side. Pt denies any visual problems compared to his baseline out of left eye.

## 2014-08-11 DIAGNOSIS — H1130 Conjunctival hemorrhage, unspecified eye: Secondary | ICD-10-CM | POA: Diagnosis not present

## 2014-08-13 DIAGNOSIS — H1130 Conjunctival hemorrhage, unspecified eye: Secondary | ICD-10-CM | POA: Diagnosis not present

## 2014-08-13 DIAGNOSIS — Z79899 Other long term (current) drug therapy: Secondary | ICD-10-CM | POA: Diagnosis not present

## 2014-08-25 DIAGNOSIS — H1130 Conjunctival hemorrhage, unspecified eye: Secondary | ICD-10-CM | POA: Diagnosis not present

## 2014-09-03 ENCOUNTER — Encounter: Payer: Managed Care, Other (non HMO) | Attending: Nephrology | Admitting: *Deleted

## 2014-09-03 ENCOUNTER — Encounter: Payer: Self-pay | Admitting: *Deleted

## 2014-09-03 VITALS — Ht 72.0 in | Wt 230.2 lb

## 2014-09-03 DIAGNOSIS — E119 Type 2 diabetes mellitus without complications: Secondary | ICD-10-CM | POA: Diagnosis not present

## 2014-09-03 DIAGNOSIS — Z713 Dietary counseling and surveillance: Secondary | ICD-10-CM | POA: Insufficient documentation

## 2014-09-03 NOTE — Progress Notes (Signed)
Appt start time: 0900 end time:  1030.  Assessment:  Patient was seen on  09/03/14 for individual diabetes education. He is here with his wife who states requested this visit. He states he SMBG every AM with reported range of 150-175 mg/dl. Historically it was above 200 most of the time until the MD increased his Glipizide recently. He is retired from working for a [employer]. He had a kidney transplant in 2004 and is tolerating it well. He walks daily for about 30 minutes. They keep their 35 yo grand daughter in the afternoons.   Patient Education Plan per assessed needs and concerns is to attend individual session for Diabetes Self Management Education.  Current HbA1c: 7.5%  Preferred Learning Style:   No preference indicated   Learning Readiness:   Ready  Change in progress  MEDICATIONS: see list, Diabetes medications are Actos and Glipizide  DIETARY INTAKE:  24-hr recall:  B ( AM): bacon, eggs, on 2  toast (40 calorie bread), lite mayo, coffee with Splenda and Diet Coke Snk ( AM): occasionally crackers or chips  L ( PM): 1 sandwich, Diet Coke Snk ( PM): whole wheat crackers with pimento cheese OR chips D ( PM): meat, starch, vegetables (prepared meal from grocery store), 4 glasses of wine, lemon water Snk ( PM): munches on crackers with cheese, chips, cottage cheese or Vienna sausages, other salty foods Beverages: coffee, diet Coke, lemon water and wine  Usual physical activity: 30 minutes walking every AM  Estimated energy needs: 1600 calories 180 g carbohydrates 120 g protein 44 g fat  Progress Towards Goal(s):  In progress.   Nutritional Diagnosis:  NB-1.1 Food and nutrition-related knowledge deficit As related to diabetes.  As evidenced by A1c of 7.5%.    Intervention:  Nutrition counseling provided.  Discussed diabetes disease process and treatment options.  Discussed physiology of diabetes and role of obesity on insulin resistance.  Encouraged moderate  weight reduction to improve glucose levels.    Provided education on macronutrients on glucose levels.  Provided education on carb counting, importance of regularly scheduled meals/snacks, and meal planning  Discussed effects of physical activity on glucose levels and long-term glucose control.  Recommended 150 minutes of physical activity/week.  Reviewed patient medications.  Discussed role of medication on blood glucose and possible side effects  Discussed blood glucose monitoring and interpretation.  Discussed recommended target ranges and individual ranges.    Described short-term complications: hyper- and hypo-glycemia.  Discussed causes,symptoms, and treatment options.  Discussed prevention, detection, and treatment of long-term complications.  Discussed the role of prolonged elevated glucose levels on body systems.  Discussed role of stress on blood glucose levels and discussed strategies to manage psychosocial issues.  Discussed recommendations for long-term diabetes self-care.  Provided checklist for medical, dental, and emotional self-care.  Plan:  Aim for 3 Carb Choices per meal (45 grams) +/- 1 either way  Aim for 0-2 Carbs per snack if hungry  Include protein in moderation with your meals and snacks Consider reading food labels for Total Carbohydrate of foods Continue with your activity level by walking and swimming for 30 minutes daily as tolerated Consider checking BG at alternate times per day as directed by MD   Teaching Method Utilized: Visual, Auditory and Hands on  Handouts given during visit include: Living Well with Diabetes Carb Counting and Food Label handouts Meal Plan Card  Barriers to learning/adherence to lifestyle change: none  Diabetes self-care support plan:   [facility] support group available  Demonstrated degree of understanding via:  Teach Back   Monitoring/Evaluation:  Dietary intake, exercise, reading food labels, SMBG , and body weight  prn.

## 2014-09-03 NOTE — Patient Instructions (Signed)
Plan:  Aim for 3 Carb Choices per meal (45 grams) +/- 1 either way  Aim for 0-2 Carbs per snack if hungry  Include protein in moderation with your meals and snacks Consider reading food labels for Total Carbohydrate of foods Continue with your activity level by walking and swimming for 30 minutes daily as tolerated Consider checking BG at alternate times per day as directed by MD

## 2014-12-22 DIAGNOSIS — Z940 Kidney transplant status: Secondary | ICD-10-CM | POA: Diagnosis not present

## 2014-12-22 DIAGNOSIS — M109 Gout, unspecified: Secondary | ICD-10-CM | POA: Diagnosis not present

## 2014-12-22 DIAGNOSIS — Z79899 Other long term (current) drug therapy: Secondary | ICD-10-CM | POA: Diagnosis not present

## 2014-12-22 DIAGNOSIS — E139 Other specified diabetes mellitus without complications: Secondary | ICD-10-CM | POA: Diagnosis not present

## 2015-01-14 DIAGNOSIS — E139 Other specified diabetes mellitus without complications: Secondary | ICD-10-CM | POA: Diagnosis not present

## 2015-01-14 DIAGNOSIS — I10 Essential (primary) hypertension: Secondary | ICD-10-CM | POA: Diagnosis not present

## 2015-01-14 DIAGNOSIS — N183 Chronic kidney disease, stage 3 (moderate): Secondary | ICD-10-CM | POA: Diagnosis not present

## 2015-01-14 DIAGNOSIS — E785 Hyperlipidemia, unspecified: Secondary | ICD-10-CM | POA: Diagnosis not present

## 2015-02-02 DIAGNOSIS — H33012 Retinal detachment with single break, left eye: Secondary | ICD-10-CM | POA: Diagnosis not present

## 2015-02-02 DIAGNOSIS — H02834 Dermatochalasis of left upper eyelid: Secondary | ICD-10-CM | POA: Diagnosis not present

## 2015-02-02 DIAGNOSIS — H0289 Other specified disorders of eyelid: Secondary | ICD-10-CM | POA: Diagnosis not present

## 2015-02-02 DIAGNOSIS — H43813 Vitreous degeneration, bilateral: Secondary | ICD-10-CM | POA: Diagnosis not present

## 2015-02-04 DIAGNOSIS — H33022 Retinal detachment with multiple breaks, left eye: Secondary | ICD-10-CM | POA: Diagnosis not present

## 2015-03-10 DIAGNOSIS — M79673 Pain in unspecified foot: Secondary | ICD-10-CM | POA: Diagnosis not present

## 2015-06-10 DIAGNOSIS — M109 Gout, unspecified: Secondary | ICD-10-CM | POA: Diagnosis not present

## 2015-06-10 DIAGNOSIS — E139 Other specified diabetes mellitus without complications: Secondary | ICD-10-CM | POA: Diagnosis not present

## 2015-06-10 DIAGNOSIS — Z940 Kidney transplant status: Secondary | ICD-10-CM | POA: Diagnosis not present

## 2015-07-22 DIAGNOSIS — I10 Essential (primary) hypertension: Secondary | ICD-10-CM | POA: Diagnosis not present

## 2015-07-22 DIAGNOSIS — E139 Other specified diabetes mellitus without complications: Secondary | ICD-10-CM | POA: Diagnosis not present

## 2015-07-22 DIAGNOSIS — N183 Chronic kidney disease, stage 3 (moderate): Secondary | ICD-10-CM | POA: Diagnosis not present

## 2015-07-22 DIAGNOSIS — E785 Hyperlipidemia, unspecified: Secondary | ICD-10-CM | POA: Diagnosis not present

## 2015-07-23 DIAGNOSIS — N183 Chronic kidney disease, stage 3 (moderate): Secondary | ICD-10-CM | POA: Diagnosis not present

## 2015-07-29 DIAGNOSIS — N183 Chronic kidney disease, stage 3 (moderate): Secondary | ICD-10-CM | POA: Diagnosis not present

## 2015-08-16 DIAGNOSIS — Z85828 Personal history of other malignant neoplasm of skin: Secondary | ICD-10-CM | POA: Diagnosis not present

## 2015-08-16 DIAGNOSIS — Z08 Encounter for follow-up examination after completed treatment for malignant neoplasm: Secondary | ICD-10-CM | POA: Diagnosis not present

## 2015-08-16 DIAGNOSIS — L570 Actinic keratosis: Secondary | ICD-10-CM | POA: Diagnosis not present

## 2015-08-16 DIAGNOSIS — L821 Other seborrheic keratosis: Secondary | ICD-10-CM | POA: Diagnosis not present

## 2015-09-16 DIAGNOSIS — H5212 Myopia, left eye: Secondary | ICD-10-CM | POA: Diagnosis not present

## 2015-09-16 DIAGNOSIS — H401213 Low-tension glaucoma, right eye, severe stage: Secondary | ICD-10-CM | POA: Diagnosis not present

## 2015-09-16 DIAGNOSIS — H401223 Low-tension glaucoma, left eye, severe stage: Secondary | ICD-10-CM | POA: Diagnosis not present

## 2015-09-16 DIAGNOSIS — E119 Type 2 diabetes mellitus without complications: Secondary | ICD-10-CM | POA: Diagnosis not present

## 2015-09-16 DIAGNOSIS — H524 Presbyopia: Secondary | ICD-10-CM | POA: Diagnosis not present

## 2015-09-16 DIAGNOSIS — H52221 Regular astigmatism, right eye: Secondary | ICD-10-CM | POA: Diagnosis not present

## 2015-09-16 DIAGNOSIS — H52222 Regular astigmatism, left eye: Secondary | ICD-10-CM | POA: Diagnosis not present

## 2015-09-16 DIAGNOSIS — H35373 Puckering of macula, bilateral: Secondary | ICD-10-CM | POA: Diagnosis not present

## 2015-10-08 DIAGNOSIS — Z23 Encounter for immunization: Secondary | ICD-10-CM | POA: Diagnosis not present

## 2015-11-11 DIAGNOSIS — Z940 Kidney transplant status: Secondary | ICD-10-CM | POA: Diagnosis not present

## 2015-11-11 DIAGNOSIS — E785 Hyperlipidemia, unspecified: Secondary | ICD-10-CM | POA: Diagnosis not present

## 2015-11-11 DIAGNOSIS — E139 Other specified diabetes mellitus without complications: Secondary | ICD-10-CM | POA: Diagnosis not present

## 2015-11-25 DIAGNOSIS — Z940 Kidney transplant status: Secondary | ICD-10-CM | POA: Diagnosis not present

## 2015-12-14 DIAGNOSIS — J029 Acute pharyngitis, unspecified: Secondary | ICD-10-CM | POA: Diagnosis not present

## 2015-12-14 DIAGNOSIS — J3489 Other specified disorders of nose and nasal sinuses: Secondary | ICD-10-CM | POA: Diagnosis not present

## 2015-12-14 DIAGNOSIS — J069 Acute upper respiratory infection, unspecified: Secondary | ICD-10-CM | POA: Diagnosis not present

## 2016-01-17 DIAGNOSIS — E139 Other specified diabetes mellitus without complications: Secondary | ICD-10-CM | POA: Diagnosis not present

## 2016-01-17 DIAGNOSIS — M109 Gout, unspecified: Secondary | ICD-10-CM | POA: Diagnosis not present

## 2016-01-17 DIAGNOSIS — Z940 Kidney transplant status: Secondary | ICD-10-CM | POA: Diagnosis not present

## 2016-01-20 DIAGNOSIS — E139 Other specified diabetes mellitus without complications: Secondary | ICD-10-CM | POA: Diagnosis not present

## 2016-01-20 DIAGNOSIS — I10 Essential (primary) hypertension: Secondary | ICD-10-CM | POA: Diagnosis not present

## 2016-01-20 DIAGNOSIS — N183 Chronic kidney disease, stage 3 (moderate): Secondary | ICD-10-CM | POA: Diagnosis not present

## 2016-01-20 DIAGNOSIS — E785 Hyperlipidemia, unspecified: Secondary | ICD-10-CM | POA: Diagnosis not present

## 2016-01-24 DIAGNOSIS — D045 Carcinoma in situ of skin of trunk: Secondary | ICD-10-CM | POA: Diagnosis not present

## 2016-02-02 DIAGNOSIS — H11153 Pinguecula, bilateral: Secondary | ICD-10-CM | POA: Diagnosis not present

## 2016-02-02 DIAGNOSIS — H04123 Dry eye syndrome of bilateral lacrimal glands: Secondary | ICD-10-CM | POA: Diagnosis not present

## 2016-02-02 DIAGNOSIS — H401213 Low-tension glaucoma, right eye, severe stage: Secondary | ICD-10-CM | POA: Diagnosis not present

## 2016-02-02 DIAGNOSIS — H401223 Low-tension glaucoma, left eye, severe stage: Secondary | ICD-10-CM | POA: Diagnosis not present

## 2016-03-03 ENCOUNTER — Ambulatory Visit (INDEPENDENT_AMBULATORY_CARE_PROVIDER_SITE_OTHER): Payer: Medicare Other | Admitting: Family Medicine

## 2016-03-03 ENCOUNTER — Encounter: Payer: Self-pay | Admitting: Family Medicine

## 2016-03-03 VITALS — BP 123/78 | HR 72 | Temp 98.4°F | Resp 16 | Ht 72.5 in | Wt 237.2 lb

## 2016-03-03 DIAGNOSIS — I10 Essential (primary) hypertension: Secondary | ICD-10-CM | POA: Diagnosis not present

## 2016-03-03 DIAGNOSIS — N183 Chronic kidney disease, stage 3 unspecified: Secondary | ICD-10-CM

## 2016-03-03 DIAGNOSIS — Z940 Kidney transplant status: Secondary | ICD-10-CM

## 2016-03-03 DIAGNOSIS — E118 Type 2 diabetes mellitus with unspecified complications: Secondary | ICD-10-CM

## 2016-03-03 MED ORDER — PIOGLITAZONE HCL 45 MG PO TABS: 45.0000 mg | ORAL_TABLET | Freq: Every day | ORAL | Status: DC

## 2016-03-03 NOTE — Progress Notes (Signed)
Office Note 03/03/2016  CC:  Chief Complaint  Patient presents with  . Establish Care    transfer from Dr. Maceo Pro   HPI:  James Zimmerman is a 65 y.o. White male who is here to establish care. Patient's most recent primary MD: Dr. Maceo Pro at [facility] in [place] records in EPIC/HL EMR were reviewed prior to or during today's visit.  Gets f/u visit and labs with Dr. Mercy Moore q 3 mo, most recent was about 1 mo ago. Pt reports everything was stable except his DM but pt cannot recall A1c or any other details of results.  Dr. Mercy Moore added some more glipizide in his morning dose 1 mo ago.  Pt checks his glucose fasting daily and some days a second check later in day if he feels bad. Fastings "all over the place" but usually <200 the last month.  Later in the day it is often 190s. He is trying to start exercising more.   He tries to eat a diabetic diet.  Home bp monitoring normal for about the last 1 yr.  Past Medical History  Diagnosis Date  . Diabetes mellitus   . H/O polycystic kidney disease   . Hypertension   . Hyperlipidemia   . GERD (gastroesophageal reflux disease)   . Anxiety     crowds, closed in spaces, used to sleep, prn  . Chronic kidney disease     polycystic kidney dz.   . Kidney transplant as cause of abnormal reaction or later complication     [id number]   . Arthritis   . H/O nonmelanoma skin cancer     SCC (chest, face, head)  . Diverticulitis   . Hx of adenomatous colonic polyps 02/2012; 05/2014    tubular adenomas (Dr. Oletta Lamas)  . Glaucoma   . DDD (degenerative disc disease), lumbar   . History of retinal detachment     Past Surgical History  Procedure Laterality Date  . Kidney transplant  2004  . Colonoscopy  06/10/14    Tubular adenoma x 4.  . Colonoscopy  03/19/2012    tubular adenoma x 1; diverticulosis, int hem.Procedure: COLONOSCOPY;  Surgeon: Winfield Cunas., MD;  Location: [facility] ENDOSCOPY;  Service: Endoscopy;  Laterality: N/A;  . Hot  hemostasis  03/19/2012    Procedure: HOT HEMOSTASIS (ARGON PLASMA COAGULATION/BICAP);  Surgeon: Winfield Cunas., MD;  Location[facility] ENDOSCOPY;  Service: Endoscopy;  Laterality: N/A;  . Total hip arthroplasty  12/10/2012    Procedure: TOTAL HIP ARTHROPLASTY ANTERIOR APPROACH;  Surgeon: Mauri Pole, MD;  Location: [facility] ORS;  Service: Orthopedics;  Laterality: Right;    Family History  Problem Relation Age of Onset  . Polycystic kidney disease    . Anesthesia problems Neg Hx   . Hypotension Neg Hx   . Malignant hyperthermia Neg Hx   . Pseudochol deficiency Neg Hx   . Diabetes Mother   . Heart attack Mother   . Hyperlipidemia Father   . Kidney disease Father   . Hypertension Father   . Kidney disease Brother   . Heart disease Brother   . Diabetes Maternal Grandmother   . Kidney disease Paternal Grandfather     Social History   Social History  . Marital Status: Married    Spouse Name: N/A  . Number of Children: N/A  . Years of Education: N/A   Occupational History  . Not on file.   Social History Main Topics  . Smoking status: Former Smoker --  2.00 packs/day for 30 years    Types: Cigarettes    Quit date: 12/26/1983  . Smokeless tobacco: Never Used  . Alcohol Use: 16.8 oz/week    28 Glasses of wine per week     Comment: every day 3 glasses of wine  . Drug Use: No  . Sexual Activity: Not on file   Other Topics Concern  . Not on file   Social History Narrative   Married, one son.   Occupation: retired Merchant navy officer.   Orig from [place].   Former smoker x 20 yrs, quit in 71s.   Alcohol: 3 glasses of wine nightly, occ beer, rare hard liquor.       Outpatient Encounter Prescriptions as of 03/03/2016  Medication Sig  . allopurinol (ZYLOPRIM) 100 MG tablet Take 200 mg by mouth at bedtime.   . ALPRAZolam (XANAX) 0.25 MG tablet Take 0.25 mg by mouth at bedtime as needed. For anxiety  . amLODipine (NORVASC) 2.5 MG tablet Take 2.5 mg by mouth every morning.   .  bimatoprost (LUMIGAN) 0.03 % ophthalmic solution Place 1 drop into both eyes at bedtime.  . carboxymethylcellulose (REFRESH PLUS) 0.5 % SOLN 1 drop 3 (three) times daily as needed.  . ezetimibe (ZETIA) 10 MG tablet Take 10 mg by mouth daily.  Marland Kitchen FLUoxetine (PROZAC) 40 MG capsule Take 40 mg by mouth daily. Patient takes in am  . glipiZIDE (GLUCOTROL) 5 MG tablet Take 2.5-5 mg by mouth 2 (two) times daily before a meal. 5mg  in the am, 2.5mg  in the evening  . Magnesium 400 MG CAPS Take 1 tablet by mouth 2 (two) times daily.  . metoprolol (LOPRESSOR) 50 MG tablet Take 50 mg by mouth 2 (two) times daily.  . mycophenolate (CELLCEPT) 250 MG capsule Take 250 mg by mouth 2 (two) times daily.   . pantoprazole (PROTONIX) 40 MG tablet Take 40 mg by mouth 2 (two) times daily.   . pioglitazone (ACTOS) 45 MG tablet Take 1 tablet (45 mg total) by mouth daily.  . predniSONE (DELTASONE) 5 MG tablet Take 5 mg by mouth daily.  . tacrolimus (PROGRAF) 0.5 MG capsule Take 1 capsule by mouth 2 (two) times daily.  . tacrolimus (PROGRAF) 1 MG capsule Take 1.5 mg by mouth 2 (two) times daily.   . [DISCONTINUED] hydroxypropyl methylcellulose (ISOPTO TEARS) 2.5 % ophthalmic solution Place 1 drop into both eyes 3 (three) times daily as needed for dry eyes. Reported on 03/03/2016  . [DISCONTINUED] pioglitazone (ACTOS) 30 MG tablet Take 30 mg by mouth daily.  . [DISCONTINUED] tacrolimus (PROGRAF) 1 MG capsule Take 1.5 capsules by mouth 2 (two) times daily. Reported on 03/03/2016   No facility-administered encounter medications on file as of 03/03/2016.    Allergies  Allergen Reactions  . Lipitor [Atorvastatin] Other (See Comments)    Headaches  . Zocor [Simvastatin] Other (See Comments)    Myalgia  . Penicillins Rash    ROS Review of Systems  Constitutional: Negative for fever and fatigue.  HENT: Negative for congestion and sore throat.   Eyes: Negative for visual disturbance.  Respiratory: Negative for cough.    Cardiovascular: Negative for chest pain.  Gastrointestinal: Negative for nausea and abdominal pain.  Genitourinary: Negative for dysuria.  Musculoskeletal: Negative for back pain and joint swelling.  Skin: Negative for rash.  Neurological: Negative for weakness and headaches.  Hematological: Negative for adenopathy.    PE; Blood pressure 123/78, pulse 72, temperature 98.4 F (36.9 C), temperature source Oral, resp. rate  16, height 6' 0.5" (1.842 m), weight 237 lb 4 oz (107.616 kg), SpO2 95 %. Gen: Alert, well appearing.  Patient is oriented to person, place, time, and situation. [id number]: no injection, icteris, swelling, or exudate.  EOMI, PERRLA. Mouth: lips without lesion/swelling.  Oral mucosa pink and moist. Oropharynx without erythema, exudate, or swelling.  CV: RRR, no m/r/g.   LUNGS: CTA bilat, nonlabored resps, good aeration in all lung fields. ABD: soft, NT/ND EXT: no clubbing, cyanosis, or edema.   Pertinent labs:  None today  ASSESSMENT AND PLAN:   New pt; obtain pertinent old records.  1) DM 2, not ideal control. Will get most recent labs from Dr. Mercy Moore. Increase actos to 45mg  qd. Dr. Mercy Moore recently increased glipizide to 5mg  qAM + 2.5mg  qPM. We'll see how these changes help things in 36mo when I see him back and recheck HbA1c. Will have him come in fasting and will check FLP and CMET and TSH at that time as well.  2) HTN; The current medical regimen is effective;  continue present plan and medications.  3) Hx of kidney transplant, with ongoing CRI of unclear stage at this time.  Need old records/labs. Continue anti-rejection meds: mycophenolate, prednisone, and tacrolimus.  Continue specialist f/u as per his current routine.  Spent 35 min with pt today, with >50% of this time spent in counseling and care coordination regarding the above problems.  An After Visit Summary was printed and given to the patient.  Return in about 2 months (around 05/03/2016)  for 30 min f/u DM, etc--fasting .   Signed:  Crissie Sickles, MD           03/03/2016

## 2016-03-03 NOTE — Progress Notes (Signed)
Pre visit review using our clinic review tool, if applicable. No additional management support is needed unless otherwise documented below in the visit note. 

## 2016-03-08 DIAGNOSIS — Z940 Kidney transplant status: Secondary | ICD-10-CM | POA: Diagnosis not present

## 2016-03-08 DIAGNOSIS — E139 Other specified diabetes mellitus without complications: Secondary | ICD-10-CM | POA: Diagnosis not present

## 2016-03-08 DIAGNOSIS — E785 Hyperlipidemia, unspecified: Secondary | ICD-10-CM | POA: Diagnosis not present

## 2016-03-14 ENCOUNTER — Other Ambulatory Visit: Payer: Self-pay | Admitting: Family Medicine

## 2016-03-14 NOTE — Telephone Encounter (Signed)
RF request for alprazolam LOV: 03/03/16 Next ov: 05/03/16 Last written: unknown (new pt)  Please advise. Thanks.

## 2016-03-15 NOTE — Telephone Encounter (Signed)
Rx faxed

## 2016-03-16 DIAGNOSIS — Z940 Kidney transplant status: Secondary | ICD-10-CM | POA: Diagnosis not present

## 2016-03-25 ENCOUNTER — Encounter: Payer: Self-pay | Admitting: Family Medicine

## 2016-04-13 DIAGNOSIS — E782 Mixed hyperlipidemia: Secondary | ICD-10-CM | POA: Diagnosis not present

## 2016-04-13 DIAGNOSIS — Z940 Kidney transplant status: Secondary | ICD-10-CM | POA: Diagnosis not present

## 2016-04-13 DIAGNOSIS — I10 Essential (primary) hypertension: Secondary | ICD-10-CM | POA: Diagnosis not present

## 2016-05-03 ENCOUNTER — Encounter: Payer: Self-pay | Admitting: Family Medicine

## 2016-05-03 ENCOUNTER — Ambulatory Visit (INDEPENDENT_AMBULATORY_CARE_PROVIDER_SITE_OTHER): Payer: Medicare Other | Admitting: Family Medicine

## 2016-05-03 VITALS — BP 142/82 | HR 77 | Temp 98.1°F | Ht 72.0 in | Wt 241.4 lb

## 2016-05-03 DIAGNOSIS — I10 Essential (primary) hypertension: Secondary | ICD-10-CM | POA: Diagnosis not present

## 2016-05-03 DIAGNOSIS — N183 Chronic kidney disease, stage 3 unspecified: Secondary | ICD-10-CM

## 2016-05-03 DIAGNOSIS — E785 Hyperlipidemia, unspecified: Secondary | ICD-10-CM

## 2016-05-03 DIAGNOSIS — Z1159 Encounter for screening for other viral diseases: Secondary | ICD-10-CM

## 2016-05-03 DIAGNOSIS — E118 Type 2 diabetes mellitus with unspecified complications: Secondary | ICD-10-CM

## 2016-05-03 LAB — COMPREHENSIVE METABOLIC PANEL
ALBUMIN: 4.1 g/dL (ref 3.5–5.2)
ALK PHOS: 71 U/L (ref 39–117)
ALT: 19 U/L (ref 0–53)
AST: 19 U/L (ref 0–37)
BILIRUBIN TOTAL: 1.1 mg/dL (ref 0.2–1.2)
BUN: 32 mg/dL — AB (ref 6–23)
CO2: 24 mEq/L (ref 19–32)
CREATININE: 1.51 mg/dL — AB (ref 0.40–1.50)
Calcium: 9.5 mg/dL (ref 8.4–10.5)
Chloride: 102 mEq/L (ref 96–112)
GFR: 49.41 mL/min — ABNORMAL LOW (ref >60.00)
GLUCOSE: 157 mg/dL — AB (ref 70–99)
POTASSIUM: 4.2 meq/L (ref 3.5–5.1)
Sodium: 137 mEq/L (ref 135–145)
TOTAL PROTEIN: 6.6 g/dL (ref 6.0–8.3)

## 2016-05-03 LAB — HEMOGLOBIN A1C: Hgb A1c MFr Bld: 6.8 % — ABNORMAL HIGH (ref 4.6–6.5)

## 2016-05-03 LAB — TSH: TSH: 2.36 u[IU]/mL (ref 0.35–4.50)

## 2016-05-03 NOTE — Progress Notes (Signed)
OFFICE VISIT  05/03/2016   CC:  Chief Complaint  Patient presents with  . Follow-up    DM   Pt is not fasting today.  HPI:    Patient is a 65 y.o. Caucasian male who presents for 2 mo f/u DM 2. Increased actos to 45 mg qd last visit.  No side effects. Glucoses fasting now 150-160 avg.  Takes 10mg  glipizide qAM and 1 qpm.  He went to [facility] nephrologist (Dr. Tamala Julian) and they d/c'd his zetia and started him on crestor.  So far so good.  Feet: no tingling, numbness, or burning.  No hx of diabetic ulcer.  Compliant with bp meds, no home bp monitoring to report.   Past Medical History  Diagnosis Date  . Diabetes mellitus   . H/O polycystic kidney disease   . Hypertension   . Hyperlipidemia   . GERD (gastroesophageal reflux disease)   . Anxiety and depression     crowds, closed in spaces, used to sleep, prn  . Chronic renal insufficiency, stage III (moderate)     polycystic kidney dz.   . History of kidney transplant     2004   . Arthritis   . H/O nonmelanoma skin cancer     SCC (chest, face, head)--s/p Moh's 2009  . Diverticulitis   . Hx of adenomatous colonic polyps 02/2012; 05/2014    tubular adenomas (Dr. Oletta Lamas)  . Glaucoma   . DDD (degenerative disc disease), lumbar   . History of retinal detachment     Dr. Baird [facility]  . Gout   . Deep vein thrombosis (DVT) of left upper extremity (HCC)     due to PICC line    Past Surgical History  Procedure Laterality Date  . Kidney transplant  2004  . Colonoscopy  06/10/14    Tubular adenoma x 4: recall 3 yrs (Dr. Alferd [facility])  . Colonoscopy  03/19/2012    tubular adenoma x 1; diverticulosis, int hem.Procedure: COLONOSCOPY;  Surgeon: Winfield Cunas., MD;  Location: [facility] ENDOSCOPY;  Service: Endoscopy;  Laterality: N/A;  . Hot hemostasis  03/19/2012    Procedure: HOT HEMOSTASIS (ARGON PLASMA COAGULATION/BICAP);  Surgeon: Winfield Cunas., MD;  Location: [facility] ENDOSCOPY;  Service: Endoscopy;  Laterality: N/A;  . Total hip  arthroplasty  12/10/2012    Procedure: TOTAL HIP ARTHROPLASTY ANTERIOR APPROACH;  Surgeon: Mauri Pole, MD;  Location: [facility] ORS;  Service: Orthopedics;  Laterality: Right;    Outpatient Prescriptions Prior to Visit  Medication Sig Dispense Refill  . allopurinol (ZYLOPRIM) 100 MG tablet Take 200 mg by mouth at bedtime.     . ALPRAZolam (XANAX) 0.25 MG tablet 1 tab po qd prn 30 tablet 5  . amLODipine (NORVASC) 2.5 MG tablet Take 2.5 mg by mouth every morning.     . bimatoprost (LUMIGAN) 0.03 % ophthalmic solution Place 1 drop into both eyes at bedtime.    . carboxymethylcellulose (REFRESH PLUS) 0.5 % SOLN 1 drop 3 (three) times daily as needed.    Marland Kitchen FLUoxetine (PROZAC) 40 MG capsule Take 40 mg by mouth daily. Patient takes in am    . glipiZIDE (GLUCOTROL) 5 MG tablet Take 2.5-5 mg by mouth 2 (two) times daily before a meal. 5mg  in the am, 2.5mg  in the evening    . Magnesium 400 MG CAPS Take 1 tablet by mouth 2 (two) times daily.    . metoprolol (LOPRESSOR) 50 MG tablet Take 50 mg by mouth 2 (two) times daily.    Marland Kitchen  mycophenolate (CELLCEPT) 250 MG capsule Take 250 mg by mouth 2 (two) times daily.     . pantoprazole (PROTONIX) 40 MG tablet Take 40 mg by mouth 2 (two) times daily.     . pioglitazone (ACTOS) 45 MG tablet Take 1 tablet (45 mg total) by mouth daily. 30 tablet 11  . predniSONE (DELTASONE) 5 MG tablet Take 5 mg by mouth daily.    . tacrolimus (PROGRAF) 0.5 MG capsule Take 1 capsule by mouth 2 (two) times daily.  6  . tacrolimus (PROGRAF) 1 MG capsule Take 1.5 mg by mouth 2 (two) times daily.     Marland Kitchen ezetimibe (ZETIA) 10 MG tablet Take 10 mg by mouth daily.     No facility-administered medications prior to visit.    Allergies  Allergen Reactions  . Lipitor [Atorvastatin] Other (See Comments)    Headaches  . Zocor [Simvastatin] Other (See Comments)    Myalgia  . Penicillins Rash    ROS As per HPI  PE: Blood pressure 142/82, pulse 77, temperature 98.1 F (36.7 C),  temperature source Oral, height 6' (1.829 m), weight 241 lb 6.4 oz (109.498 kg), SpO2 93 %. Gen: Alert, well appearing.  Patient is oriented to person, place, time, and situation. Foot exam - bilateral normal; no swelling, tenderness or skin or vascular lesions. Color and temperature is normal. Sensation impaired with monofilament testing on plantar surfaces bilaterally. Peripheral pulses are palpable. Toenails are normal.   LABS:  None today  IMPRESSION AND PLAN:  1) DM 2; improved glucoses last 2 mo since increase in actos. Check HbA1c today. Feet exam today shows loss of protective sensation today with monofilament testing and we discussed this today. Will do urine microalb/cr next f/u.  2) Essential HTN: The current medical regimen is effective;  continue present plan and medications.  3) Hyperlipidemia: recently started new statin (crestor).  Doing fine on this so far despite past hx of statin intolerance.  4) Preventative health care: Hep C screening done today.  An After Visit Summary was printed and given to the patient.  FOLLOW UP: Return in about 3 months (around 08/03/2016) for routine chronic illness f/u--fasting visit.  Signed:  Crissie Sickles, MD           05/03/2016

## 2016-05-03 NOTE — Progress Notes (Signed)
Pre visit review using our clinic review tool, if applicable. No additional management support is needed unless otherwise documented below in the visit note. 

## 2016-05-04 LAB — HEPATITIS C ANTIBODY: HCV AB: NEGATIVE

## 2016-06-13 DIAGNOSIS — Z940 Kidney transplant status: Secondary | ICD-10-CM | POA: Diagnosis not present

## 2016-06-13 DIAGNOSIS — E139 Other specified diabetes mellitus without complications: Secondary | ICD-10-CM | POA: Diagnosis not present

## 2016-06-13 DIAGNOSIS — M109 Gout, unspecified: Secondary | ICD-10-CM | POA: Diagnosis not present

## 2016-06-28 ENCOUNTER — Encounter: Payer: Self-pay | Admitting: Family Medicine

## 2016-06-28 DIAGNOSIS — H52221 Regular astigmatism, right eye: Secondary | ICD-10-CM | POA: Diagnosis not present

## 2016-06-28 DIAGNOSIS — H5212 Myopia, left eye: Secondary | ICD-10-CM | POA: Diagnosis not present

## 2016-06-28 DIAGNOSIS — H524 Presbyopia: Secondary | ICD-10-CM | POA: Diagnosis not present

## 2016-06-28 DIAGNOSIS — H401213 Low-tension glaucoma, right eye, severe stage: Secondary | ICD-10-CM | POA: Diagnosis not present

## 2016-06-28 DIAGNOSIS — H401223 Low-tension glaucoma, left eye, severe stage: Secondary | ICD-10-CM | POA: Diagnosis not present

## 2016-06-28 DIAGNOSIS — H04123 Dry eye syndrome of bilateral lacrimal glands: Secondary | ICD-10-CM | POA: Diagnosis not present

## 2016-06-28 DIAGNOSIS — H11153 Pinguecula, bilateral: Secondary | ICD-10-CM | POA: Diagnosis not present

## 2016-06-28 DIAGNOSIS — H5211 Myopia, right eye: Secondary | ICD-10-CM | POA: Diagnosis not present

## 2016-06-28 LAB — HM DIABETES EYE EXAM

## 2016-07-24 DIAGNOSIS — N183 Chronic kidney disease, stage 3 (moderate): Secondary | ICD-10-CM | POA: Diagnosis not present

## 2016-07-24 DIAGNOSIS — I10 Essential (primary) hypertension: Secondary | ICD-10-CM | POA: Diagnosis not present

## 2016-07-24 DIAGNOSIS — E139 Other specified diabetes mellitus without complications: Secondary | ICD-10-CM | POA: Diagnosis not present

## 2016-07-24 DIAGNOSIS — E785 Hyperlipidemia, unspecified: Secondary | ICD-10-CM | POA: Diagnosis not present

## 2016-07-24 DIAGNOSIS — Z6831 Body mass index (BMI) 31.0-31.9, adult: Secondary | ICD-10-CM | POA: Diagnosis not present

## 2016-08-01 ENCOUNTER — Encounter: Payer: Self-pay | Admitting: Family Medicine

## 2016-08-03 ENCOUNTER — Encounter: Payer: Self-pay | Admitting: Family Medicine

## 2016-08-03 ENCOUNTER — Ambulatory Visit (INDEPENDENT_AMBULATORY_CARE_PROVIDER_SITE_OTHER): Payer: Medicare Other | Admitting: Family Medicine

## 2016-08-03 VITALS — BP 132/86 | HR 70 | Temp 97.9°F | Resp 16 | Ht 72.0 in | Wt 239.0 lb

## 2016-08-03 DIAGNOSIS — K529 Noninfective gastroenteritis and colitis, unspecified: Secondary | ICD-10-CM

## 2016-08-03 DIAGNOSIS — I10 Essential (primary) hypertension: Secondary | ICD-10-CM | POA: Diagnosis not present

## 2016-08-03 DIAGNOSIS — Z23 Encounter for immunization: Secondary | ICD-10-CM

## 2016-08-03 DIAGNOSIS — E118 Type 2 diabetes mellitus with unspecified complications: Secondary | ICD-10-CM

## 2016-08-03 DIAGNOSIS — N183 Chronic kidney disease, stage 3 unspecified: Secondary | ICD-10-CM

## 2016-08-03 DIAGNOSIS — R7989 Other specified abnormal findings of blood chemistry: Secondary | ICD-10-CM

## 2016-08-03 DIAGNOSIS — E785 Hyperlipidemia, unspecified: Secondary | ICD-10-CM | POA: Diagnosis not present

## 2016-08-03 LAB — LIPID PANEL
CHOL/HDL RATIO: 5
CHOLESTEROL: 222 mg/dL — AB (ref 0–200)
HDL: 41.8 mg/dL (ref >39.00)
NONHDL: 179.84
Triglycerides: 376 mg/dL — ABNORMAL HIGH (ref 0.0–149.0)
VLDL: 75.2 mg/dL — AB (ref 0.0–40.0)

## 2016-08-03 LAB — BASIC METABOLIC PANEL
BUN: 21 mg/dL (ref 6–23)
CO2: 26 mEq/L (ref 19–32)
CREATININE: 1.39 mg/dL (ref 0.40–1.50)
Calcium: 9.4 mg/dL (ref 8.4–10.5)
Chloride: 103 mEq/L (ref 96–112)
GFR: 54.33 mL/min — AB (ref >60.00)
Glucose, Bld: 157 mg/dL — ABNORMAL HIGH (ref 70–99)
POTASSIUM: 4 meq/L (ref 3.5–5.1)
Sodium: 137 mEq/L (ref 135–145)

## 2016-08-03 LAB — LDL CHOLESTEROL, DIRECT: Direct LDL: 119 mg/dL

## 2016-08-03 LAB — MICROALBUMIN / CREATININE URINE RATIO
Creatinine,U: 180.2 mg/dL
MICROALB/CREAT RATIO: 2.2 mg/g (ref 0.0–30.0)
Microalb, Ur: 3.9 mg/dL — ABNORMAL HIGH (ref 0.0–1.9)

## 2016-08-03 LAB — HEMOGLOBIN A1C: Hgb A1c MFr Bld: 6.3 % (ref 4.6–6.5)

## 2016-08-03 NOTE — Progress Notes (Signed)
OFFICE VISIT  08/03/2016   CC:  Chief Complaint  James Zimmerman presents with  . Follow-up    Pt is fasting.      HPI:    James Zimmerman is a 65 y.o. Caucasian male who presents for 3 mo f/u DM 2, HTN, HLD. Has been on Crestor now for at least 3 mo and is tolerating this fine.   DM 2: "real good".  Avg 150 per pt report. HTN: compliant with meds.  Occ bp monitoring outside of office: 120s/80s.  Saw Dr. Mercy Moore about 2 wks ago but no labs done at that visit.  Says he has had about 5 yrs of loose BMs every other day on average, with some mild GI upset/abd discomfort.  Presumed due to meds but it got worse around the time of a beach trip in the past year, when his wife also started to get diarrhea.  He says that her GI, Dr. Oletta Lamas, is treating her empirically for giardia.     Past Medical History:  Diagnosis Date  . Anxiety and depression    crowds, closed in spaces, used to sleep, prn  . Arthritis   . Chronic renal insufficiency, stage III (moderate)    polycystic kidney dz. (followed by Dr. Mattingly/Nephrology).  . DDD (degenerative disc disease), lumbar   . Deep vein thrombosis (DVT) of left upper extremity (HCC)    due to PICC line  . 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 Diverticulitis   . GERD (gastroesophageal reflux disease)   . Glaucoma   . Gout   . H/O nonmelanoma skin cancer    SCC (chest, face, head)--s/p Moh's 2009  . H/O polycystic kidney disease   . History of kidney transplant    2004   . History of retinal detachment    Dr. Baird Cancer  . Hx of adenomatous colonic polyps 02/2012; 05/2014   tubular adenomas (Dr. Oletta Lamas)  . Hyperlipidemia   . Hypertension     Past Surgical History:  Procedure Laterality Date  . COLONOSCOPY  06/10/14   Tubular adenoma x 4: recall 3 yrs (Dr. Alferd Apa [facility])  . COLONOSCOPY  03/19/2012   tubular adenoma x 1; diverticulosis, int hem.Procedure: COLONOSCOPY;  Surgeon: Winfield Cunas., MD;  Location: [facility] ENDOSCOPY;  Service: Endoscopy;  Laterality: N/A;  . HOT HEMOSTASIS  03/19/2012   Procedure: HOT HEMOSTASIS (ARGON PLASMA COAGULATION/BICAP);  Surgeon: Winfield Cunas., MD;  Location: [facility] ENDOSCOPY;  Service: Endoscopy;  Laterality: N/A;  . KIDNEY TRANSPLANT  2004  . TOTAL HIP ARTHROPLASTY  12/10/2012   Procedure: TOTAL HIP ARTHROPLASTY ANTERIOR APPROACH;  Surgeon: Mauri Pole, MD;  Location: [facility] ORS;  Service: Orthopedics;  Laterality: Right;    Outpatient Medications Prior to Visit  Medication Sig Dispense Refill  . allopurinol (ZYLOPRIM) 100 MG tablet Take 200 mg by mouth at bedtime.     . ALPRAZolam (XANAX) 0.25 MG tablet 1 tab po qd prn (James Zimmerman taking differently: Take 0.25 mg by mouth 2 (two) times daily as needed. 1 tab po qd prn) 30 tablet 5  . amLODipine (NORVASC) 2.5 MG tablet Take 5 mg by mouth daily.     . bimatoprost (LUMIGAN) 0.03 % ophthalmic solution Place 1 drop into both eyes at bedtime.    . carboxymethylcellulose (REFRESH PLUS) 0.5 % SOLN 1 drop 3 (three) times daily as needed.    Marland Kitchen FLUoxetine (PROZAC) 40 MG capsule Take 40 mg by mouth daily. James Zimmerman  takes in am    . glipiZIDE (GLUCOTROL) 5 MG tablet Take 5 mg by mouth. 2 tablets in the morning and 1 tablet in the evening    . Magnesium 400 MG CAPS Take 1 tablet by mouth 2 (two) times daily.    . metoprolol (LOPRESSOR) 50 MG tablet Take 50 mg by mouth 2 (two) times daily.    . mycophenolate (CELLCEPT) 250 MG capsule Take 250 mg by mouth 2 (two) times daily.     . pantoprazole (PROTONIX) 40 MG tablet Take 40 mg by mouth 2 (two) times daily.     . pioglitazone (ACTOS) 45 MG tablet Take 1 tablet (45 mg total) by mouth daily. 30 tablet 11  . predniSONE (DELTASONE) 5 MG tablet Take 5 mg by mouth daily.    . rosuvastatin (CRESTOR) 5 MG tablet Take 1 tablet by mouth daily.  12  . tacrolimus (PROGRAF) 0.5 MG capsule Take 1 capsule by mouth 2 (two) times daily.  6  . tacrolimus (PROGRAF) 1 MG  capsule Take 1 mg by mouth 2 (two) times daily.      No facility-administered medications prior to visit.     Allergies  Allergen Reactions  . Lipitor [Atorvastatin] Other (See Comments)    Headaches  . Zocor [Simvastatin] Other (See Comments)    Myalgia  . Penicillins Rash    ROS As per HPI  PE: Blood pressure 132/86, pulse 70, temperature 97.9 F (36.6 C), temperature source Oral, resp. rate 16, height 6' (1.829 m), weight 239 lb (108.4 kg), SpO2 94 %. Gen: Alert, well appearing.  James Zimmerman is oriented to person, place, time, and situation. AFFECT: pleasant, lucid thought and speech. No exam today.  LABS:  Lab Results  Component Value Date   TSH 2.36 05/03/2016   Lab Results  Component Value Date   WBC 6.8 12/12/2012   HGB 11.0 (L) 12/12/2012   HCT 32.3 (L) 12/12/2012   MCV 96.4 12/12/2012   PLT 101 (L) 12/12/2012   Lab Results  Component Value Date   CREATININE 1.51 (H) 05/03/2016   BUN 32 (H) 05/03/2016   NA 137 05/03/2016   K 4.2 05/03/2016   CL 102 05/03/2016   CO2 24 05/03/2016   Lab Results  Component Value Date   ALT 19 05/03/2016   AST 19 05/03/2016   ALKPHOS 71 05/03/2016   BILITOT 1.1 05/03/2016   Lab Results  Component Value Date   HGBA1C 6.8 (H) 05/03/2016   IMPRESSION AND PLAN:  1) DM 2, good control. Hba1c and urine microalb/cr today. Prevnar 13 given today.  Needs pneumovax 23 in 1 yr.  2) HTN: The current medical regimen is effective;  continue present plan and medications. Lytes/cr today.  3) HLD: tolerating crestor low dose.  Needs FLP today.  AST/ALT good 4 mo ago.  4) CRI; followed by renal.  Has been stable.  Will check BMET today.  Eventually his labs will start getting routed to our office.  5) Chronic diarrhea: ? Giardia.  Lets check stool sample for giardia --stool collection kit given today. May even consider empiric metronidazole tx if his wife seems to respond to her treatment.  An After Visit Summary was printed and  given to the James Zimmerman.  FOLLOW UP: Return in about 3 months (around 11/03/2016) for routine chronic illness f/u.  Signed:  Crissie Sickles, MD           08/03/2016

## 2016-08-03 NOTE — Progress Notes (Signed)
Pre visit review using our clinic review tool, if applicable. No additional management support is needed unless otherwise documented below in the visit note. 

## 2016-08-04 DIAGNOSIS — K529 Noninfective gastroenteritis and colitis, unspecified: Secondary | ICD-10-CM | POA: Diagnosis not present

## 2016-08-04 MED ORDER — ROSUVASTATIN CALCIUM 10 MG PO TABS: 10.0000 mg | ORAL_TABLET | Freq: Every day | ORAL | 6 refills | Status: DC

## 2016-08-04 NOTE — Addendum Note (Signed)
Addended by: Ralph Dowdy on: 08/04/2016 10:00 AM   Modules accepted: Orders

## 2016-08-08 LAB — GIARDIA/CRYPTOSPORIDIUM (EIA)

## 2016-08-09 NOTE — Progress Notes (Signed)
Patient aware of results.   States his wife had just a little improvement with diarrhea on antibiotics but it didn't help the abdominal pain.

## 2016-08-23 ENCOUNTER — Other Ambulatory Visit: Payer: Self-pay | Admitting: Family Medicine

## 2016-08-23 MED ORDER — ACCU-CHEK SMARTVIEW VI STRP: 2.0000 | ORAL_STRIP | Freq: Three times a day (TID) | 10 refills | Status: DC | PRN

## 2016-08-24 MED ORDER — ACCU-CHEK SMARTVIEW VI STRP: 1.0000 | ORAL_STRIP | Freq: Every day | 10 refills | Status: DC

## 2016-09-28 DIAGNOSIS — H401223 Low-tension glaucoma, left eye, severe stage: Secondary | ICD-10-CM | POA: Diagnosis not present

## 2016-09-28 DIAGNOSIS — H401213 Low-tension glaucoma, right eye, severe stage: Secondary | ICD-10-CM | POA: Diagnosis not present

## 2016-09-28 DIAGNOSIS — H35373 Puckering of macula, bilateral: Secondary | ICD-10-CM | POA: Diagnosis not present

## 2016-09-28 DIAGNOSIS — H11153 Pinguecula, bilateral: Secondary | ICD-10-CM | POA: Diagnosis not present

## 2016-09-28 DIAGNOSIS — H04123 Dry eye syndrome of bilateral lacrimal glands: Secondary | ICD-10-CM | POA: Diagnosis not present

## 2016-10-23 DIAGNOSIS — E139 Other specified diabetes mellitus without complications: Secondary | ICD-10-CM | POA: Diagnosis not present

## 2016-10-23 DIAGNOSIS — E785 Hyperlipidemia, unspecified: Secondary | ICD-10-CM | POA: Diagnosis not present

## 2016-10-23 DIAGNOSIS — Z940 Kidney transplant status: Secondary | ICD-10-CM | POA: Diagnosis not present

## 2016-11-01 ENCOUNTER — Encounter: Payer: Self-pay | Admitting: Family Medicine

## 2016-11-01 ENCOUNTER — Ambulatory Visit (INDEPENDENT_AMBULATORY_CARE_PROVIDER_SITE_OTHER): Payer: Medicare Other | Admitting: Family Medicine

## 2016-11-01 VITALS — BP 126/80 | HR 77 | Temp 97.0°F | Resp 16 | Wt 238.1 lb

## 2016-11-01 DIAGNOSIS — E118 Type 2 diabetes mellitus with unspecified complications: Secondary | ICD-10-CM

## 2016-11-01 DIAGNOSIS — I10 Essential (primary) hypertension: Secondary | ICD-10-CM | POA: Diagnosis not present

## 2016-11-01 DIAGNOSIS — Z23 Encounter for immunization: Secondary | ICD-10-CM

## 2016-11-01 DIAGNOSIS — E782 Mixed hyperlipidemia: Secondary | ICD-10-CM | POA: Diagnosis not present

## 2016-11-01 DIAGNOSIS — Z0000 Encounter for general adult medical examination without abnormal findings: Secondary | ICD-10-CM

## 2016-11-01 LAB — LIPID PANEL
CHOL/HDL RATIO: 5
Cholesterol: 215 mg/dL — ABNORMAL HIGH (ref 0–200)
HDL: 46.2 mg/dL (ref >39.00)
Triglycerides: 447 mg/dL — ABNORMAL HIGH (ref 0.0–149.0)

## 2016-11-01 LAB — LDL CHOLESTEROL, DIRECT: LDL DIRECT: 113 mg/dL

## 2016-11-01 LAB — HEMOGLOBIN A1C: HEMOGLOBIN A1C: 6.3 % (ref 4.6–6.5)

## 2016-11-01 NOTE — Progress Notes (Signed)
Pre visit review using our clinic review tool, if applicable. No additional management support is needed unless otherwise documented below in the visit note. 

## 2016-11-01 NOTE — Progress Notes (Addendum)
OFFICE VISIT  11/01/2016   CC:  Chief Complaint  Patient presents with  . Follow-up    Diabetes   HPI:    Patient is a 65 y.o. Caucasian male who presents for 3 mo f/u DM 2, HLD, and HTN. No acute complaints.  HLD: tolerating crestor 10mg  qd. DM: fasting avg 150.  Occ check later in day 160s. HTN: occ monitoring when he feels bad.  120s/80s usually.  Has CRI stage 3, most recent f/u with nephrologist was 06/2016 but had labs 2 days ago---we'll get these labs for review ASAP.  Past Medical History:  Diagnosis Date  . Anxiety and depression    crowds, closed in spaces, used to sleep, prn  . Arthritis   . Chronic renal insufficiency, stage III (moderate)    polycystic kidney dz. (followed by Dr. Mattingly/Nephrology).  . DDD (degenerative disc disease), lumbar   . Deep vein thrombosis (DVT) of left upper extremity (HCC)    due to PICC line  . 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 Diverticulitis   . GERD (gastroesophageal reflux disease)   . Glaucoma   . Gout   . H/O nonmelanoma skin cancer    SCC (chest, face, head)--s/p Moh's 2009  . H/O polycystic kidney disease   . History of kidney transplant    2004   . History of retinal detachment    Dr. Baird [facility]  . Hx of adenomatous colonic polyps 02/2012; 05/2014   tubular adenomas (Dr. Oletta Lamas)  . Hyperlipidemia   . Hypertension     Past Surgical History:  Procedure Laterality Date  . COLONOSCOPY  06/10/14   Tubular adenoma x 4: recall 3 yrs (Dr. Alferd [facility])  . COLONOSCOPY  03/19/2012   tubular adenoma x 1; diverticulosis, int hem.Procedure: COLONOSCOPY;  Surgeon: Winfield Cunas., MD;  Location: [facility] ENDOSCOPY;  Service: Endoscopy;  Laterality: N/A;  . HOT HEMOSTASIS  03/19/2012   Procedure: HOT HEMOSTASIS (ARGON PLASMA COAGULATION/BICAP);  Surgeon: Winfield Cunas., MD;  Location: [facility] ENDOSCOPY;  Service: Endoscopy;  Laterality: N/A;  . KIDNEY TRANSPLANT  2004   . TOTAL HIP ARTHROPLASTY  12/10/2012   Procedure: TOTAL HIP ARTHROPLASTY ANTERIOR APPROACH;  Surgeon: Mauri Pole, MD;  Location: [facility] ORS;  Service: Orthopedics;  Laterality: Right;    Outpatient Medications Prior to Visit  Medication Sig Dispense Refill  . ACCU-CHEK SMARTVIEW test strip 1 each by Other route daily. Check blood once daily. 100 each 10  . allopurinol (ZYLOPRIM) 100 MG tablet Take 200 mg by mouth at bedtime.     . ALPRAZolam (XANAX) 0.25 MG tablet 1 tab po qd prn (Patient taking differently: Take 0.25 mg by mouth 2 (two) times daily as needed. 1 tab po qd prn) 30 tablet 5  . amLODipine (NORVASC) 2.5 MG tablet Take 5 mg by mouth daily.     . bimatoprost (LUMIGAN) 0.03 % ophthalmic solution Place 1 drop into both eyes at bedtime.    . carboxymethylcellulose (REFRESH PLUS) 0.5 % SOLN 1 drop 3 (three) times daily as needed.    Marland Kitchen FLUoxetine (PROZAC) 40 MG capsule Take 40 mg by mouth daily. Patient takes in am    . glipiZIDE (GLUCOTROL) 5 MG tablet Take 5 mg by mouth. 2 tablets in the morning and 1 tablet in the evening    . Magnesium 400 MG CAPS Take 1 tablet by mouth 2 (two) times daily.    . metoprolol (LOPRESSOR) 50  MG tablet Take 50 mg by mouth 2 (two) times daily.    . mycophenolate (CELLCEPT) 250 MG capsule Take 250 mg by mouth 2 (two) times daily.     . pantoprazole (PROTONIX) 40 MG tablet Take 40 mg by mouth 2 (two) times daily.     . pioglitazone (ACTOS) 45 MG tablet Take 1 tablet (45 mg total) by mouth daily. 30 tablet 11  . predniSONE (DELTASONE) 5 MG tablet Take 5 mg by mouth daily.    . rosuvastatin (CRESTOR) 10 MG tablet Take 1 tablet (10 mg total) by mouth daily. 30 tablet 6  . tacrolimus (PROGRAF) 0.5 MG capsule Take 1 capsule by mouth 2 (two) times daily.  6  . tacrolimus (PROGRAF) 1 MG capsule Take 1 mg by mouth 2 (two) times daily.      No facility-administered medications prior to visit.     Allergies  Allergen Reactions  . Lipitor [Atorvastatin] Other  (See Comments)    Headaches  . Zocor [Simvastatin] Other (See Comments)    Myalgia  . Penicillins Rash    ROS As per HPI  PE: Blood pressure 126/80, pulse 77, temperature 97 F (36.1 C), temperature source Oral, resp. rate 16, weight 238 lb 1.9 oz (108 kg), SpO2 94 %. Gen: Alert, well appearing.  Patient is oriented to person, place, time, and situation. AFFECT: pleasant, lucid thought and speech. No further exam today.  LABS:  Lab Results  Component Value Date   CHOL 222 (H) 08/03/2016   HDL 41.80 08/03/2016   LDLDIRECT 119.0 08/03/2016   TRIG 376.0 (H) 08/03/2016   CHOLHDL 5 08/03/2016     Chemistry      Component Value Date/Time   NA 137 08/03/2016 1008   K 4.0 08/03/2016 1008   CL 103 08/03/2016 1008   CO2 26 08/03/2016 1008   BUN 21 08/03/2016 1008   CREATININE 1.39 08/03/2016 1008      Component Value Date/Time   CALCIUM 9.4 08/03/2016 1008   ALKPHOS 71 05/03/2016 1050   AST 19 05/03/2016 1050   ALT 19 05/03/2016 1050   BILITOT 1.1 05/03/2016 1050     Lab Results  Component Value Date   HGBA1C 6.3 08/03/2016    IMPRESSION AND PLAN:  1) DM 2, control has been good. Recheck Hba1c today.  2) HLD: increased crestor from 5 to 10mg  qd 3 mo ago. Recheck FLP today.  AST/ALT normal 6 mo ago.  3) HTN; The current medical regimen is effective;  continue present plan and medications.  4) CRI stage III: will obtain most recent lab results from Dr. Etheleen Nicks office from 2 days ago.  5) Preventative health care: Flu vaccine and Tdap given today.  An After Visit Summary was printed and given to the patient.  FOLLOW UP: Return in about 3 months (around 02/01/2017) for routine chronic illness f/u. Needs AWV at some point in near future.  Signed:  Crissie Sickles, MD           11/01/2016  ADDENDUM 11/02/16, 5 PM: I reviewed labs from 10/23/16 received today from nephrologist's office: CMET normal except Cr 1.4 (GFR 52), CBC normal, Mag and phos normal, CMV/BK/EBV  all NEG; Hba1c 6.4%. Chol panel: Tchol 202, trigs 293, HDL 44, LDL 99.  Tacrolimus level 6.5 (therapeutic).

## 2016-11-01 NOTE — Addendum Note (Signed)
Addended by: Gordy Councilman on: 11/01/2016 10:25 AM   Modules accepted: Orders

## 2016-11-02 ENCOUNTER — Encounter: Payer: Self-pay | Admitting: Family Medicine

## 2016-11-03 ENCOUNTER — Other Ambulatory Visit: Payer: Self-pay | Admitting: Family Medicine

## 2016-11-03 MED ORDER — ROSUVASTATIN CALCIUM 20 MG PO TABS: 20.0000 mg | ORAL_TABLET | Freq: Every day | ORAL | 6 refills | Status: DC

## 2016-11-28 ENCOUNTER — Telehealth: Payer: Self-pay

## 2016-11-28 NOTE — Telephone Encounter (Signed)
Called to schedule AWV, declined at this time.

## 2017-01-15 DIAGNOSIS — E139 Other specified diabetes mellitus without complications: Secondary | ICD-10-CM | POA: Diagnosis not present

## 2017-01-15 DIAGNOSIS — M109 Gout, unspecified: Secondary | ICD-10-CM | POA: Diagnosis not present

## 2017-01-15 DIAGNOSIS — Z940 Kidney transplant status: Secondary | ICD-10-CM | POA: Diagnosis not present

## 2017-01-16 LAB — BASIC METABOLIC PANEL
BUN: 22 mg/dL — AB (ref 4–21)
Creatinine: 1.4 mg/dL — AB (ref <=1.3)
GLUCOSE: 147 mg/dL
POTASSIUM: 4.6 mmol/L (ref 3.4–5.3)
SODIUM: 139 mmol/L (ref 137–147)

## 2017-01-16 LAB — HEPATIC FUNCTION PANEL
ALK PHOS: 117 U/L (ref 25–125)
ALT: 21 U/L (ref 10–40)
AST: 21 U/L (ref 14–40)
Bilirubin, Total: 0.7 mg/dL

## 2017-01-16 LAB — CBC AND DIFFERENTIAL
HEMATOCRIT: 45 % (ref 41–53)
HEMOGLOBIN: 14.9 g/dL (ref 13.5–17.5)
Neutrophils Absolute: 4 /uL
Platelets: 150 10*3/uL (ref 150–399)
WBC: 7.2 10^3/mL

## 2017-01-16 LAB — HEMOGLOBIN A1C: Hemoglobin A1C: 6.2

## 2017-01-25 DIAGNOSIS — E139 Other specified diabetes mellitus without complications: Secondary | ICD-10-CM | POA: Diagnosis not present

## 2017-01-25 DIAGNOSIS — E785 Hyperlipidemia, unspecified: Secondary | ICD-10-CM | POA: Diagnosis not present

## 2017-01-25 DIAGNOSIS — N183 Chronic kidney disease, stage 3 (moderate): Secondary | ICD-10-CM | POA: Diagnosis not present

## 2017-01-25 DIAGNOSIS — K2970 Gastritis, unspecified, without bleeding: Secondary | ICD-10-CM

## 2017-01-25 DIAGNOSIS — Z6831 Body mass index (BMI) 31.0-31.9, adult: Secondary | ICD-10-CM | POA: Diagnosis not present

## 2017-01-25 DIAGNOSIS — I10 Essential (primary) hypertension: Secondary | ICD-10-CM | POA: Diagnosis not present

## 2017-01-25 HISTORY — DX: Gastritis, unspecified, without bleeding: K29.70

## 2017-02-01 ENCOUNTER — Encounter: Payer: Self-pay | Admitting: Family Medicine

## 2017-02-01 ENCOUNTER — Ambulatory Visit (INDEPENDENT_AMBULATORY_CARE_PROVIDER_SITE_OTHER): Payer: Medicare Other | Admitting: Family Medicine

## 2017-02-01 VITALS — BP 125/85 | HR 75 | Temp 98.0°F | Resp 16 | Ht 72.0 in | Wt 244.2 lb

## 2017-02-01 DIAGNOSIS — E118 Type 2 diabetes mellitus with unspecified complications: Secondary | ICD-10-CM

## 2017-02-01 DIAGNOSIS — I10 Essential (primary) hypertension: Secondary | ICD-10-CM

## 2017-02-01 DIAGNOSIS — N183 Chronic kidney disease, stage 3 unspecified: Secondary | ICD-10-CM

## 2017-02-01 DIAGNOSIS — E7800 Pure hypercholesterolemia, unspecified: Secondary | ICD-10-CM

## 2017-02-01 LAB — LDL CHOLESTEROL, DIRECT: Direct LDL: 79 mg/dL

## 2017-02-01 LAB — LIPID PANEL
Cholesterol: 178 mg/dL (ref 0–200)
HDL: 58.5 mg/dL (ref >39.00)
NONHDL: 119.52
Total CHOL/HDL Ratio: 3
Triglycerides: 277 mg/dL — ABNORMAL HIGH (ref 0.0–149.0)
VLDL: 55.4 mg/dL — AB (ref 0.0–40.0)

## 2017-02-01 NOTE — Progress Notes (Signed)
OFFICE VISIT  02/01/2017   CC:  Chief Complaint  Patient presents with  . Follow-up    [facility], pt is fasting.      HPI:    Patient is a 66 y.o. Caucasian male who presents for 3 mo f/u DM 2, HTN, HLD. He's feeling well. He has CRI stage III, with hx of kidney transplant and is followed locally by Dr. Mercy Moore. He got labs done 01/16/17 for Dr. Mercy Moore and these were all stable (Cr 1.36, GFR 54 ml/min), including HbA1c 6.2%.  CBC, CMET, Uric acid, and tacrolimus level were all wnl.  Last visit his cholesterol was elevated so I increased his crestor to 20mg  qd. No known side effects.  Has been taking this daily.  He doesn't eat a low fat/low chol diet. Exercise: tries to swim twice per week at the [facility].  Not doing this last few months, though.  HTN: home bp monitoring shows all normal measurements.  DM: fasting glucoses avg 150.    ROS: mild chronic fatigue.  No fevers.  No CP, no SOB, no HAs.  No acute vision c/o's.   Some chronic mild arthralgias--unchanged in years.  Past Medical History:  Diagnosis Date  . Anxiety and depression    crowds, closed in spaces, used to sleep, prn  . Arthritis   . Chronic renal insufficiency, stage III (moderate)    polycystic kidney dz. (followed by Dr. Mattingly/Nephrology).  . DDD (degenerative disc disease), lumbar   . Deep vein thrombosis (DVT) of left upper extremity (HCC)    due to PICC line  . 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 Diverticulitis   . GERD (gastroesophageal reflux disease)   . Glaucoma   . Gout   . H/O nonmelanoma skin cancer    SCC (chest, face, head)--s/p Moh's 2009  . H/O polycystic kidney disease   . History of kidney transplant    2004   . History of retinal detachment    Dr. Baird Cancer  . Hx of adenomatous colonic polyps 02/2012; 05/2014   tubular adenomas (Dr. Oletta Lamas)  . Hyperlipemia, mixed   . Hypertension     Past Surgical History:  Procedure  Laterality Date  . COLONOSCOPY  06/10/14   Tubular adenoma x 4: recall 3 yrs (Dr. Alferd [facility]a [facility])  . COLONOSCOPY  03/19/2012   tubular adenoma x 1; diverticulosis, int hem.Procedure: COLONOSCOPY;  Surgeon: Winfield Cunas., MD;  Location: [facility] ENDOSCOPY;  Service: Endoscopy;  Laterality: N/A;  . HOT HEMOSTASIS  03/19/2012   Procedure: HOT HEMOSTASIS (ARGON PLASMA COAGULATION/BICAP);  Surgeon: Winfield Cunas., MD;  Location: [facility] ENDOSCOPY;  Service: Endoscopy;  Laterality: N/A;  . KIDNEY TRANSPLANT  2004  . TOTAL HIP ARTHROPLASTY  12/10/2012   Procedure: TOTAL HIP ARTHROPLASTY ANTERIOR APPROACH;  Surgeon: Mauri Pole, MD;  Location: [facility] ORS;  Service: Orthopedics;  Laterality: Right;    Outpatient Medications Prior to Visit  Medication Sig Dispense Refill  . ACCU-CHEK SMARTVIEW test strip 1 each by Other route daily. Check blood once daily. 100 each 10  . allopurinol (ZYLOPRIM) 100 MG tablet Take 200 mg by mouth at bedtime.     . ALPRAZolam (XANAX) 0.25 MG tablet 1 tab po qd prn (Patient taking differently: Take 0.25 mg by mouth 2 (two) times daily as needed. 1 tab po qd prn) 30 tablet 5  . amLODipine (NORVASC) 2.5 MG tablet Take 5 mg by mouth daily.     Marland Kitchen  bimatoprost (LUMIGAN) 0.03 % ophthalmic solution Place 1 drop into both eyes at bedtime.    . carboxymethylcellulose (REFRESH PLUS) 0.5 % SOLN 1 drop 3 (three) times daily as needed.    Marland Kitchen FLUoxetine (PROZAC) 40 MG capsule Take 40 mg by mouth daily. Patient takes in am    . glipiZIDE (GLUCOTROL) 5 MG tablet Take 5 mg by mouth. 2 tablets in the morning and 1 tablet in the evening    . Magnesium 400 MG CAPS Take 1 tablet by mouth 2 (two) times daily.    . metoprolol (LOPRESSOR) 50 MG tablet Take 50 mg by mouth 2 (two) times daily.    . mycophenolate (CELLCEPT) 250 MG capsule Take 250 mg by mouth 2 (two) times daily.     . pantoprazole (PROTONIX) 40 MG tablet Take 40 mg by mouth 2 (two) times daily.     . pioglitazone (ACTOS) 45 MG  tablet Take 1 tablet (45 mg total) by mouth daily. 30 tablet 11  . predniSONE (DELTASONE) 5 MG tablet Take 5 mg by mouth daily.    . rosuvastatin (CRESTOR) 20 MG tablet Take 1 tablet (20 mg total) by mouth daily. 30 tablet 6  . tacrolimus (PROGRAF) 0.5 MG capsule Take 1 capsule by mouth 2 (two) times daily.  6  . tacrolimus (PROGRAF) 1 MG capsule Take 1 mg by mouth 2 (two) times daily.     Marland Kitchen MAGNESIUM-OXIDE 400 (241.3 Mg) MG tablet TK 1 T PO TID  6   No facility-administered medications prior to visit.     Allergies  Allergen Reactions  . Lipitor [Atorvastatin] Other (See Comments)    Headaches  . Zocor [Simvastatin] Other (See Comments)    Myalgia  . Penicillins Rash    ROS As per HPI  PE: Blood pressure 125/85, pulse 75, temperature 98 F (36.7 C), temperature source Oral, resp. rate 16, height 6' (1.829 m), weight 244 lb 4 oz (110.8 kg), SpO2 94 %. Gen: Alert, well appearing.  Patient is oriented to person, place, time, and situation. AFFECT: pleasant, lucid thought and speech. CV: RRR, no m/r/g.   LUNGS: CTA bilat, nonlabored resps, good aeration in all lung fields. EXT: no clubbing, cyanosis, or edema.   LABS:  Lab Results  Component Value Date   TSH 2.36 05/03/2016   Lab Results  Component Value Date   WBC 6.8 12/12/2012   HGB 11.0 (L) 12/12/2012   HCT 32.3 (L) 12/12/2012   MCV 96.4 12/12/2012   PLT 101 (L) 12/12/2012   Lab Results  Component Value Date   CREATININE 1.39 08/03/2016   BUN 21 08/03/2016   NA 137 08/03/2016   K 4.0 08/03/2016   CL 103 08/03/2016   CO2 26 08/03/2016   Lab Results  Component Value Date   ALT 19 05/03/2016   AST 19 05/03/2016   ALKPHOS 71 05/03/2016   BILITOT 1.1 05/03/2016   Lab Results  Component Value Date   CHOL 215 (H) 11/01/2016   Lab Results  Component Value Date   HDL 46.20 11/01/2016   No results found for: [facility] Lab Results  Component Value Date   TRIG (H) 11/01/2016    447.0 Triglyceride is over  400; calculations on Lipids are invalid.   Lab Results  Component Value Date   CHOLHDL 5 11/01/2016   Lab Results  Component Value Date   HGBA1C 6.3 11/01/2016   IMPRESSION AND PLAN:  1) DM 2, well controlled. Most recent A1c 01/16/17 was 6.2%.  2) HTN: The current medical regimen is effective;  continue present plan and medications. Lytes/cr stable.  3) HLD: tolerating increased dose of crestor (20mg ).  Recheck FLP today.  4) CRI stage III, hx of kidney transplant: doing great/stable.  Will scan labs in. Continue f/u with Dr. Mercy Moore.   His next [facility] transplant clinic f/u is 03/2017.  5) Prostate ca screening: discussed this today.  He hasn't had this in "a while". We decided to do DRE and PSA at next office f/u in 3 mo.  An After Visit Summary was printed and given to the patient.  FOLLOW UP: Return in about 3 months (around 05/01/2017) for routine chronic illness f/u.  Signed:  Crissie Sickles, MD           02/01/2017

## 2017-02-01 NOTE — Progress Notes (Signed)
Pre visit review using our clinic review tool, if applicable. No additional management support is needed unless otherwise documented below in the visit note. 

## 2017-02-09 ENCOUNTER — Ambulatory Visit (INDEPENDENT_AMBULATORY_CARE_PROVIDER_SITE_OTHER): Payer: Medicare Other | Admitting: Family Medicine

## 2017-02-09 ENCOUNTER — Other Ambulatory Visit: Payer: Self-pay | Admitting: Family Medicine

## 2017-02-09 ENCOUNTER — Encounter: Payer: Self-pay | Admitting: Family Medicine

## 2017-02-09 VITALS — BP 100/67 | HR 84 | Temp 98.3°F | Resp 16 | Ht 72.0 in | Wt 246.8 lb

## 2017-02-09 DIAGNOSIS — K921 Melena: Secondary | ICD-10-CM

## 2017-02-09 DIAGNOSIS — K922 Gastrointestinal hemorrhage, unspecified: Secondary | ICD-10-CM

## 2017-02-09 DIAGNOSIS — R109 Unspecified abdominal pain: Secondary | ICD-10-CM

## 2017-02-09 LAB — HEMOCCULT GUIAC POC 1CARD (OFFICE): Fecal Occult Blood, POC: POSITIVE — AB

## 2017-02-09 LAB — COMPREHENSIVE METABOLIC PANEL
ALT: 18 U/L (ref 0–53)
AST: 16 U/L (ref 0–37)
Albumin: 4.1 g/dL (ref 3.5–5.2)
Alkaline Phosphatase: 101 U/L (ref 39–117)
BUN: 45 mg/dL — ABNORMAL HIGH (ref 6–23)
CALCIUM: 9.1 mg/dL (ref 8.4–10.5)
CO2: 23 meq/L (ref 19–32)
Chloride: 101 mEq/L (ref 96–112)
Creatinine, Ser: 1.65 mg/dL — ABNORMAL HIGH (ref 0.40–1.50)
GFR: 44.5 mL/min — ABNORMAL LOW (ref >60.00)
Glucose, Bld: 165 mg/dL — ABNORMAL HIGH (ref 70–99)
Potassium: 4.5 mEq/L (ref 3.5–5.1)
Sodium: 134 mEq/L — ABNORMAL LOW (ref 135–145)
TOTAL PROTEIN: 6.6 g/dL (ref 6.0–8.3)
Total Bilirubin: 1 mg/dL (ref 0.2–1.2)

## 2017-02-09 LAB — CBC WITH DIFFERENTIAL/PLATELET
BASOS ABS: 0 10*3/uL (ref 0.0–0.1)
Basophils Relative: 0.4 % (ref 0.0–3.0)
EOS ABS: 0 10*3/uL (ref 0.0–0.7)
Eosinophils Relative: 0.2 % (ref 0.0–5.0)
HEMATOCRIT: 41.7 % (ref 39.0–52.0)
HEMOGLOBIN: 14.1 g/dL (ref 13.0–17.0)
LYMPHS PCT: 28.7 % (ref 12.0–46.0)
Lymphs Abs: 2.6 10*3/uL (ref 0.7–4.0)
MCHC: 33.7 g/dL (ref 30.0–36.0)
MCV: 98.6 fl (ref 78.0–100.0)
MONOS PCT: 8.3 % (ref 3.0–12.0)
Monocytes Absolute: 0.7 10*3/uL (ref 0.1–1.0)
Neutro Abs: 5.6 10*3/uL (ref 1.4–7.7)
Neutrophils Relative %: 62.4 % (ref 43.0–77.0)
PLATELETS: 153 10*3/uL (ref 150.0–400.0)
RBC: 4.23 Mil/uL (ref 4.22–5.81)
RDW: 14.5 % (ref 11.5–15.5)
WBC: 9 10*3/uL (ref 4.0–10.5)

## 2017-02-09 LAB — LIPASE: LIPASE: 44 U/L (ref 11.0–59.0)

## 2017-02-09 NOTE — Progress Notes (Signed)
OFFICE VISIT  02/09/2017   CC:  Chief Complaint  Patient presents with  . Melena    x 3 days   HPI:    Patient is a 66 y.o. Caucasian male who presents for black, tarry stools. Notes this the last 3 days.  Started out just black/dark and loose, 2-3 times per day.  This morning it was actually tarry consistency. Usually he has a fair amount of loose stools and takes anti-diarrheal med about 3 times per week but never black stools.  No BRBPR.  Describes peri-umbilical pain that is chronic and mild--achy.  Has had more GER than usual lately.  Had resp illness a couple weeks ago and he also had some dry heaves with this but no blood in mucous and no vomiting bloody or coffee ground material.  Today he felt lightheaded briefly upon standing. BP 140/100 this morning.  No low bp measurements at home.   Last 6 mo or so he has been drinking 4 glasses of wine per day.  No NSAIDs.  Normal PO intake lately. No recent pepto bismol ingestion. Pt IS under lots of stress lately.   Past Medical History:  Diagnosis Date  . Anxiety and depression    crowds, closed in spaces, used to sleep, prn  . Arthritis   . Chronic renal insufficiency, stage III (moderate)    polycystic kidney dz. (followed by Dr. Mattingly/Nephrology).  . DDD (degenerative disc disease), lumbar   . Deep vein thrombosis (DVT) of left upper extremity (HCC)    due to PICC line  . 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 Diverticulitis   . GERD (gastroesophageal reflux disease)   . Glaucoma   . Gout   . H/O nonmelanoma skin cancer    SCC (chest, face, head)--s/p Moh's 2009  . H/O polycystic kidney disease   . History of kidney transplant    2004   . History of retinal detachment    Dr. Baird Cancer  . Hx of adenomatous colonic polyps 02/2012; 05/2014   tubular adenomas (Dr. Oletta Lamas)  . Hyperlipemia, mixed   . Hypertension     Past Surgical History:  Procedure Laterality Date   . COLONOSCOPY  06/10/14   Tubular adenoma x 4: recall 3 yrs (Dr. Alferd Apa [facility])  . COLONOSCOPY  03/19/2012   tubular adenoma x 1; diverticulosis, int hem.Procedure: COLONOSCOPY;  Surgeon: Winfield Cunas., MD;  Location: [facility] ENDOSCOPY;  Service: Endoscopy;  Laterality: N/A;  . HOT HEMOSTASIS  03/19/2012   Procedure: HOT HEMOSTASIS (ARGON PLASMA COAGULATION/BICAP);  Surgeon: Winfield Cunas., MD;  Location: [facility] ENDOSCOPY;  Service: Endoscopy;  Laterality: N/A;  . KIDNEY TRANSPLANT  2004  . TOTAL HIP ARTHROPLASTY  12/10/2012   Procedure: TOTAL HIP ARTHROPLASTY ANTERIOR APPROACH;  Surgeon: Mauri Pole, MD;  Location: [facility] ORS;  Service: Orthopedics;  Laterality: Right;    Outpatient Medications Prior to Visit  Medication Sig Dispense Refill  . ACCU-CHEK SMARTVIEW test strip 1 each by Other route daily. Check blood once daily. 100 each 10  . allopurinol (ZYLOPRIM) 100 MG tablet Take 200 mg by mouth at bedtime.     . ALPRAZolam (XANAX) 0.25 MG tablet 1 tab po qd prn (Patient taking differently: Take 0.25 mg by mouth 2 (two) times daily as needed. 1 tab po qd prn) 30 tablet 5  . amLODipine (NORVASC) 2.5 MG tablet Take 5 mg by mouth daily.     . carboxymethylcellulose (REFRESH  PLUS) 0.5 % SOLN 1 drop 3 (three) times daily as needed.    Marland Kitchen FLUoxetine (PROZAC) 40 MG capsule Take 40 mg by mouth daily. Patient takes in am    . glipiZIDE (GLUCOTROL) 5 MG tablet Take 5 mg by mouth. 2 tablets in the morning and 1 tablet in the evening    . Magnesium 400 MG CAPS Take 1 tablet by mouth 2 (two) times daily.    . metoprolol (LOPRESSOR) 50 MG tablet Take 50 mg by mouth 2 (two) times daily.    . mycophenolate (CELLCEPT) 250 MG capsule Take 250 mg by mouth 2 (two) times daily.     . pantoprazole (PROTONIX) 40 MG tablet Take 40 mg by mouth 2 (two) times daily.     . pioglitazone (ACTOS) 45 MG tablet Take 1 tablet (45 mg total) by mouth daily. 30 tablet 11  . predniSONE (DELTASONE) 5 MG tablet Take 5 mg by  mouth daily.    . rosuvastatin (CRESTOR) 20 MG tablet Take 1 tablet (20 mg total) by mouth daily. 30 tablet 6  . tacrolimus (PROGRAF) 0.5 MG capsule Take 1 capsule by mouth 2 (two) times daily.  6  . tacrolimus (PROGRAF) 1 MG capsule Take 1 mg by mouth 2 (two) times daily.     . bimatoprost (LUMIGAN) 0.03 % ophthalmic solution Place 1 drop into both eyes at bedtime.     No facility-administered medications prior to visit.     Allergies  Allergen Reactions  . Lipitor [Atorvastatin] Other (See Comments)    Headaches  . Zocor [Simvastatin] Other (See Comments)    Myalgia  . Penicillins Rash    ROS As per HPI  PE: Blood pressure 100/67, pulse 84, temperature 98.3 F (36.8 C), temperature source Oral, resp. rate 16, height 6' (1.829 m), weight 246 lb 12 oz (111.9 kg), SpO2 95 %. Gen: Alert, well appearing.  Patient is oriented to person, place, time, and situation. AFFECT: pleasant, lucid thought and speech. [id number]: no injection, icteris, swelling, or exudate.  EOMI, PERRLA. Mouth: lips without lesion/swelling.  Oral mucosa pink and moist. Oropharynx without erythema, exudate, or swelling.  CV: RRR, no m/r/g.   LUNGS: CTA bilat, nonlabored resps, good aeration in all lung fields. ABD: soft, NT except for mild discomfort with palpation in umbilical region, ND, BS normal.  No hepatospenomegaly or mass.  No bruits. Rectal exam: negative without mass, lesions or tenderness, PROSTATE EXAM: smooth and symmetric without nodules or tenderness.  Stool wipings hemoccult POSITIVE.  LABS:  POCT Hemoccult today was positive.  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  Our office does not have POC hemoglobin testing, so I have ordered CBC stat, as well as CMET and lipase.  Pt is certainly stable at this time.  If hemoglobin is normal, will have pt monitor stools and any symptoms throughout the weekend and arrange f/u with his GI MD as an outpt ASAP. If he has frequent  melenotic stools, starts feeling more weak and dizzy, or has low bp at home then he is to go to the ED or call 911. I recommended he stop any alcohol ingestion. He already takes his pantoprazole 40mg  twice daily. He already avoids NSAIDs.  If our lab today shows Hb 11 or less, will recommend pt go straight to the ED.  An After Visit Summary was printed and given to the patient.  FOLLOW UP: Return for f/u to be determined based on results of w/u.  Signed:  Crissie Sickles, MD           02/09/2017

## 2017-02-09 NOTE — Patient Instructions (Signed)
Increase your pantoprazole to 40mg  twice per day.  No alcohol ingestion, please!

## 2017-02-09 NOTE — Progress Notes (Signed)
Pre visit review using our clinic review tool, if applicable. No additional management support is needed unless otherwise documented below in the visit note. 

## 2017-02-12 ENCOUNTER — Telehealth: Payer: Self-pay | Admitting: Family Medicine

## 2017-02-12 NOTE — Telephone Encounter (Signed)
OK, I actually wanted the patient to call his GI MD and make a f/u appointment at his earliest convenience.--thx

## 2017-02-12 NOTE — Telephone Encounter (Signed)
FYI

## 2017-02-12 NOTE — Telephone Encounter (Signed)
Patient wanted to leave message about last visit. He takes rx for bleeding and he loaded a tray and forgot to put stomach medicine in the tray. Everything is back to normal. No more dark stool. [facility] was going to set up a endoscopy and colonoscopy and patient said he can still do the appointments but no urgency since he is feeling better and no blood.   Thank you

## 2017-02-12 NOTE — Telephone Encounter (Signed)
Left detailed message on cell vm, okay per DPR.  

## 2017-03-01 ENCOUNTER — Other Ambulatory Visit: Payer: Self-pay | Admitting: Family Medicine

## 2017-03-01 NOTE — Telephone Encounter (Signed)
[facility] [place]  RF request for pioglitazone LOV: 02/01/17 Next ov: 04/27/17 Last written: 03/03/16 #30 w/ 11RF

## 2017-03-21 DIAGNOSIS — H401223 Low-tension glaucoma, left eye, severe stage: Secondary | ICD-10-CM | POA: Diagnosis not present

## 2017-03-21 DIAGNOSIS — H5213 Myopia, bilateral: Secondary | ICD-10-CM | POA: Diagnosis not present

## 2017-03-21 DIAGNOSIS — H52223 Regular astigmatism, bilateral: Secondary | ICD-10-CM | POA: Diagnosis not present

## 2017-03-21 DIAGNOSIS — H11153 Pinguecula, bilateral: Secondary | ICD-10-CM | POA: Diagnosis not present

## 2017-03-21 DIAGNOSIS — H35373 Puckering of macula, bilateral: Secondary | ICD-10-CM | POA: Diagnosis not present

## 2017-03-21 DIAGNOSIS — H401213 Low-tension glaucoma, right eye, severe stage: Secondary | ICD-10-CM | POA: Diagnosis not present

## 2017-03-21 DIAGNOSIS — H524 Presbyopia: Secondary | ICD-10-CM | POA: Diagnosis not present

## 2017-04-06 ENCOUNTER — Other Ambulatory Visit: Payer: Self-pay | Admitting: Family Medicine

## 2017-04-06 NOTE — Telephone Encounter (Signed)
Rx faxed

## 2017-04-06 NOTE — Telephone Encounter (Signed)
[facility] [place].  RF request for  alprazolam LOV: 02/01/17 Next ov: 04/27/17 Last written: 03/14/16 #30 w/ 5RF  Please advise. Thanks.

## 2017-04-12 DIAGNOSIS — E1129 Type 2 diabetes mellitus with other diabetic kidney complication: Secondary | ICD-10-CM | POA: Diagnosis not present

## 2017-04-12 DIAGNOSIS — I10 Essential (primary) hypertension: Secondary | ICD-10-CM | POA: Diagnosis not present

## 2017-04-12 DIAGNOSIS — R809 Proteinuria, unspecified: Secondary | ICD-10-CM | POA: Diagnosis not present

## 2017-04-12 DIAGNOSIS — Z940 Kidney transplant status: Secondary | ICD-10-CM | POA: Diagnosis not present

## 2017-04-15 ENCOUNTER — Encounter: Payer: Self-pay | Admitting: Family Medicine

## 2017-04-27 ENCOUNTER — Ambulatory Visit: Payer: Medicare Other | Admitting: Family Medicine

## 2017-05-03 ENCOUNTER — Telehealth: Payer: Self-pay | Admitting: Family Medicine

## 2017-05-03 ENCOUNTER — Other Ambulatory Visit: Payer: Self-pay | Admitting: Family Medicine

## 2017-05-03 MED ORDER — PROMETHAZINE HCL 12.5 MG PO TABS: ORAL_TABLET | ORAL | 0 refills | Status: DC

## 2017-05-03 NOTE — Telephone Encounter (Signed)
Please advise. Thanks.  

## 2017-05-03 NOTE — Telephone Encounter (Signed)
Patient has a virus ( his granddaughter had the same thing that lasted approximately 2 days ) and has had vomiting and diarrhea since last night.  Due to his kidney transplant, he is concerned about dehydration.  Can he get RX for nausea?  Please advise.   If RX is appropiate, please send to [facility].

## 2017-05-03 NOTE — Telephone Encounter (Signed)
OK, will send in phenergan rx.

## 2017-05-03 NOTE — Telephone Encounter (Signed)
Patients wife aware

## 2017-05-22 ENCOUNTER — Other Ambulatory Visit: Payer: Self-pay | Admitting: Family Medicine

## 2017-05-24 DIAGNOSIS — E139 Other specified diabetes mellitus without complications: Secondary | ICD-10-CM | POA: Diagnosis not present

## 2017-05-24 DIAGNOSIS — Z940 Kidney transplant status: Secondary | ICD-10-CM | POA: Diagnosis not present

## 2017-05-24 DIAGNOSIS — M109 Gout, unspecified: Secondary | ICD-10-CM | POA: Diagnosis not present

## 2017-05-24 DIAGNOSIS — E785 Hyperlipidemia, unspecified: Secondary | ICD-10-CM | POA: Diagnosis not present

## 2017-07-13 ENCOUNTER — Other Ambulatory Visit: Payer: Self-pay

## 2017-07-30 DIAGNOSIS — E139 Other specified diabetes mellitus without complications: Secondary | ICD-10-CM | POA: Diagnosis not present

## 2017-07-30 DIAGNOSIS — Z940 Kidney transplant status: Secondary | ICD-10-CM | POA: Diagnosis not present

## 2017-08-03 ENCOUNTER — Other Ambulatory Visit: Payer: Self-pay | Admitting: Family Medicine

## 2017-08-03 ENCOUNTER — Encounter: Payer: Self-pay | Admitting: Family Medicine

## 2017-08-03 DIAGNOSIS — Z6831 Body mass index (BMI) 31.0-31.9, adult: Secondary | ICD-10-CM | POA: Diagnosis not present

## 2017-08-03 DIAGNOSIS — I10 Essential (primary) hypertension: Secondary | ICD-10-CM | POA: Diagnosis not present

## 2017-08-03 DIAGNOSIS — E139 Other specified diabetes mellitus without complications: Secondary | ICD-10-CM | POA: Diagnosis not present

## 2017-08-03 DIAGNOSIS — E785 Hyperlipidemia, unspecified: Secondary | ICD-10-CM | POA: Diagnosis not present

## 2017-08-03 DIAGNOSIS — N183 Chronic kidney disease, stage 3 (moderate): Secondary | ICD-10-CM | POA: Diagnosis not present

## 2017-08-03 MED ORDER — PROMETHAZINE HCL 12.5 MG PO TABS: ORAL_TABLET | ORAL | 0 refills | Status: DC

## 2017-08-03 NOTE — Telephone Encounter (Signed)
RF request for LOV: 02/09/17 Next ov: None Last written: 05/03/17 #30 w/ 0RF  Please advise. Thanks.

## 2017-08-13 ENCOUNTER — Other Ambulatory Visit: Payer: Self-pay | Admitting: Family Medicine

## 2017-08-13 DIAGNOSIS — E785 Hyperlipidemia, unspecified: Secondary | ICD-10-CM

## 2017-08-13 MED ORDER — ROSUVASTATIN CALCIUM 20 MG PO TABS: 20.0000 mg | ORAL_TABLET | Freq: Every day | ORAL | 5 refills | Status: DC

## 2017-08-13 NOTE — Addendum Note (Signed)
Addended by: Onalee Hua on: 08/13/2017 01:55 PM   Modules accepted: Orders

## 2017-08-15 DIAGNOSIS — E113292 Type 2 diabetes mellitus with mild nonproliferative diabetic retinopathy without macular edema, left eye: Secondary | ICD-10-CM | POA: Diagnosis not present

## 2017-08-15 DIAGNOSIS — H401223 Low-tension glaucoma, left eye, severe stage: Secondary | ICD-10-CM | POA: Diagnosis not present

## 2017-08-15 DIAGNOSIS — H35373 Puckering of macula, bilateral: Secondary | ICD-10-CM | POA: Diagnosis not present

## 2017-08-15 DIAGNOSIS — H11153 Pinguecula, bilateral: Secondary | ICD-10-CM | POA: Diagnosis not present

## 2017-08-15 DIAGNOSIS — H401213 Low-tension glaucoma, right eye, severe stage: Secondary | ICD-10-CM | POA: Diagnosis not present

## 2017-08-15 DIAGNOSIS — E119 Type 2 diabetes mellitus without complications: Secondary | ICD-10-CM | POA: Diagnosis not present

## 2017-08-29 ENCOUNTER — Other Ambulatory Visit: Payer: Self-pay | Admitting: Family Medicine

## 2017-08-29 NOTE — Telephone Encounter (Signed)
[facility] [place].  Will send Rx for #90 w/ 0RF. Pt is due for f/u [facility], needs office visit for more refills.

## 2017-08-30 NOTE — Telephone Encounter (Signed)
MyChart message read.

## 2017-09-05 DIAGNOSIS — K5730 Diverticulosis of large intestine without perforation or abscess without bleeding: Secondary | ICD-10-CM | POA: Diagnosis not present

## 2017-09-05 DIAGNOSIS — Z86010 Personal history of colonic polyps: Secondary | ICD-10-CM | POA: Diagnosis not present

## 2017-09-05 DIAGNOSIS — K640 First degree hemorrhoids: Secondary | ICD-10-CM | POA: Diagnosis not present

## 2017-09-12 DIAGNOSIS — H04123 Dry eye syndrome of bilateral lacrimal glands: Secondary | ICD-10-CM | POA: Diagnosis not present

## 2017-09-12 DIAGNOSIS — H401213 Low-tension glaucoma, right eye, severe stage: Secondary | ICD-10-CM | POA: Diagnosis not present

## 2017-09-12 DIAGNOSIS — H401223 Low-tension glaucoma, left eye, severe stage: Secondary | ICD-10-CM | POA: Diagnosis not present

## 2017-09-17 ENCOUNTER — Encounter: Payer: Self-pay | Admitting: Family Medicine

## 2017-09-17 ENCOUNTER — Ambulatory Visit (INDEPENDENT_AMBULATORY_CARE_PROVIDER_SITE_OTHER): Payer: Medicare Other | Admitting: Family Medicine

## 2017-09-17 VITALS — BP 117/73 | HR 72 | Temp 98.1°F | Resp 16 | Ht 72.0 in | Wt 232.0 lb

## 2017-09-17 DIAGNOSIS — Z23 Encounter for immunization: Secondary | ICD-10-CM | POA: Diagnosis not present

## 2017-09-17 DIAGNOSIS — N183 Chronic kidney disease, stage 3 unspecified: Secondary | ICD-10-CM

## 2017-09-17 DIAGNOSIS — Z0000 Encounter for general adult medical examination without abnormal findings: Secondary | ICD-10-CM

## 2017-09-17 DIAGNOSIS — I10 Essential (primary) hypertension: Secondary | ICD-10-CM

## 2017-09-17 DIAGNOSIS — Z940 Kidney transplant status: Secondary | ICD-10-CM

## 2017-09-17 DIAGNOSIS — E118 Type 2 diabetes mellitus with unspecified complications: Secondary | ICD-10-CM | POA: Diagnosis not present

## 2017-09-17 MED ORDER — ZOSTER VAC RECOMB ADJUVANTED 50 MCG/0.5ML IM SUSR: 0.5000 mL | Freq: Once | INTRAMUSCULAR | 1 refills | Status: AC

## 2017-09-17 NOTE — Progress Notes (Signed)
Subjective:   James Zimmerman is a 66 y.o. male who presents for an Initial Medicare Annual Wellness Visit.  Review of Systems  No ROS.  Medicare Wellness Visit. Additional risk factors are reflected in the social history.  Cardiac Risk Factors include: advanced age (>63men, >109 women);obesity (BMI >30kg/m2);male gender;hypertension;dyslipidemia;diabetes mellitus;family history of premature cardiovascular disease   Sleep patterns: Sleeps 6 hours. Up to void x 5.  Home Safety/Smoke Alarms: Feels safe in home. Smoke alarms in place.  Living environment; residence and Firearm Safety: Lives with wife in 2 story home.  Seat Belt Safety/Bike Helmet: Wears seat belt.    Male:   CCS-Colonoscopy 09/05/2017, pt reports diverticulitis. Recall 5 years.      PSA- No results found for: PSA     Objective:    Today's Vitals   09/17/17 0936  BP: 117/73  Pulse: 72  Resp: 16  Temp: 98.1 F (36.7 C)  TempSrc: Oral  SpO2: 96%  Weight: 232 lb (105.2 kg)  Height: 6' (1.829 m)   Body mass index is 31.46 kg/m.  Current Medications (verified) Outpatient Encounter Prescriptions as of 09/17/2017  Medication Sig  . ACCU-CHEK SMARTVIEW test strip 1 each by Other route daily. Check blood once daily.  Marland Kitchen allopurinol (ZYLOPRIM) 100 MG tablet Take 200 mg by mouth at bedtime.   . ALPRAZolam (XANAX) 0.25 MG tablet TAKE 1 TABLET BY MOUTH EVERY DAY AS NEEDED  . amLODipine (NORVASC) 2.5 MG tablet Take 5 mg by mouth daily.   . carboxymethylcellulose (REFRESH PLUS) 0.5 % SOLN 1 drop 3 (three) times daily as needed.  Marland Kitchen FLUoxetine (PROZAC) 40 MG capsule Take 40 mg by mouth daily. Patient takes in am  . glipiZIDE (GLUCOTROL) 5 MG tablet Take 5 mg by mouth. 2 tablets in the morning and 1 tablet in the evening  . Magnesium 400 MG CAPS Take 1 tablet by mouth 2 (two) times daily.  . metoprolol (LOPRESSOR) 50 MG tablet Take 50 mg by mouth 2 (two) times daily.  . mycophenolate (CELLCEPT) 250 MG capsule Take 250  mg by mouth 2 (two) times daily.   . pantoprazole (PROTONIX) 40 MG tablet Take 40 mg by mouth 2 (two) times daily.   . pioglitazone (ACTOS) 45 MG tablet TAKE 1 TABLET BY MOUTH DAILY  . predniSONE (DELTASONE) 5 MG tablet Take 5 mg by mouth daily.  . promethazine (PHENERGAN) 12.5 MG tablet 1-2 tabs po q6h prn nausea/vomiting  . rosuvastatin (CRESTOR) 20 MG tablet Take 1 tablet (20 mg total) by mouth daily.  . tacrolimus (PROGRAF) 0.5 MG capsule Take 1 capsule by mouth 2 (two) times daily.  . tacrolimus (PROGRAF) 1 MG capsule Take 1 mg by mouth 2 (two) times daily.   . bimatoprost (LUMIGAN) 0.01 % SOLN Apply 1 drop to eye at bedtime.  Marland Kitchen Zoster Vac Recomb Adjuvanted [facility]) injection Inject 0.5 mLs into the muscle once.  . [DISCONTINUED] latanoprost (XALATAN) 0.005 % ophthalmic solution Place 1 drop into both eyes at bedtime.   No facility-administered encounter medications on file as of 09/17/2017.     Allergies (verified) Lipitor [atorvastatin]; Zocor [simvastatin]; and Penicillins   History: Past Medical History:  Diagnosis Date  . Anxiety and depression    crowds, closed in spaces, used to sleep, prn  . Chronic renal insufficiency, stage III (moderate)    polycystic kidney dz. (followed by Dr. Mattingly/Nephrology).  . DDD (degenerative disc disease), lumbar   . Deep vein thrombosis (DVT) of left upper extremity (HCC)  due to PICC line  . 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 Diverticulitis   . Gastritis 01/2017   likely due to alcohol  . GERD (gastroesophageal reflux disease)   . Glaucoma   . Gout   . H/O nonmelanoma skin cancer    SCC (chest, face, head)--s/p Moh's 2009  . H/O polycystic kidney disease   . History of depression   . History of kidney transplant    2004   . History of retinal detachment    Dr. Baird [facility]  . Hx of adenomatous colonic polyps 02/2012; 05/2014   tubular adenomas (Dr. Oletta Lamas)  .  Hyperlipemia, mixed   . Hypertension   . Osteoarthritis    s/p R THR   Past Surgical History:  Procedure Laterality Date  . COLONOSCOPY  06/10/14   Tubular adenoma x 4: recall 3 yrs (Dr. [facility])  . COLONOSCOPY  03/19/2012   tubular adenoma x 1; diverticulosis, int hem.Procedure: COLONOSCOPY;  Surgeon: Winfield Cunas., MD;  Location: [facility] ENDOSCOPY;  Service: Endoscopy;  Laterality: N/A;  . HOT HEMOSTASIS  03/19/2012   Procedure: HOT HEMOSTASIS (ARGON PLASMA COAGULATION/BICAP);  Surgeon: Winfield Cunas., MD;  Location: [facility] ENDOSCOPY;  Service: Endoscopy;  Laterality: N/A;  . KIDNEY TRANSPLANT  2004  . TOTAL HIP ARTHROPLASTY  12/10/2012   Procedure: TOTAL HIP ARTHROPLASTY ANTERIOR APPROACH;  Surgeon: Mauri Pole, MD;  Location: [facility] ORS;  Service: Orthopedics;  Laterality: Right;   Family History  Problem Relation Age of Onset  . Diabetes Mother   . Heart attack Mother   . Hyperlipidemia Father   . Kidney disease Father   . Hypertension Father   . Kidney disease Brother   . Heart disease Brother   . Diabetes Maternal Grandmother   . Kidney disease Paternal Grandfather   . Polycystic kidney disease Unknown   . Anesthesia problems Neg Hx   . Hypotension Neg Hx   . Malignant hyperthermia Neg Hx   . Pseudochol deficiency Neg Hx    Social History   Occupational History  . Not on file.   Social History Main Topics  . Smoking status: Former Smoker    Packs/day: 2.00    Years: 30.00    Types: Cigarettes    Quit date: 12/26/1983  . Smokeless tobacco: Never Used  . Alcohol use 16.8 oz/week    28 Glasses of wine per week     Comment: every day 3 glasses of wine  . Drug use: No  . Sexual activity: Not on file   Tobacco Counseling Counseling given: Not Answered   Activities of Daily Living In your present state of health, do you have any difficulty performing the following activities: 09/17/2017  Hearing? N  Vision? N  Difficulty concentrating or making  decisions? N  Walking or climbing stairs? Y  Comment H/O hip replacement  Dressing or bathing? N  Doing errands, shopping? N  Preparing Food and eating ? N  Using the Toilet? N  In the past six months, have you accidently leaked urine? N  Do you have problems with loss of bowel control? N  Managing your Medications? N  Managing your Finances? N  Housekeeping or managing your Housekeeping? N  Some recent data might be hidden    Immunizations and Health Maintenance Immunization History  Administered Date(s) Administered  . Influenza, High Dose Seasonal PF 11/01/2016  . Influenza-Unspecified 10/08/2015  . Pneumococcal Conjugate-13 08/03/2016  .  Tdap 11/01/2016   Health Maintenance Due  Topic Date Due  . FOOT EXAM  05/03/2017  . OPHTHALMOLOGY EXAM  06/28/2017  . HEMOGLOBIN A1C  07/16/2017  . INFLUENZA VACCINE  07/25/2017  . URINE MICROALBUMIN  08/03/2017  . PNA vac Low Risk Adult (2 of 2 - PPSV23) 08/03/2017    Patient Care Team: [name], MD as PCP - General (Family Medicine) Fleet Contras, MD as Consulting Physician (Nephrology) Laurence Spates, MD as Consulting Physician (Gastroenterology) Virgina Evener, [place] as Consulting Physician (Optometry) Elijio Miles, MD as Referring Physician (Nephrology) Orbie Hurst, MD as Referring Physician (Dermatology)  Indicate any recent Medical Services you may have received from other than [facility] providers in the past year (date may be approximate).    Assessment:   This is a routine wellness examination for Leondre. Physical assessment deferred to PCP.   Hearing/Vision screen Hearing Screening Comments: Able to hear conversational tones w/o difficulty. No issues reported.   Vision Screening Comments: Last exam 08/25/2017, yearly. Dr. [facility]). H/O cataracts. Wears glasses/contacts.   Dietary issues and exercise activities discussed: Current Exercise Habits: Home exercise routine, Type of exercise: Other -  see comments (swimming; walking dog), Time (Minutes): 45, Frequency (Times/Week): 2, Weekly Exercise (Minutes/Week): 90, Exercise limited by: orthopedic condition(s)   Diet (meal preparation, eat out, water intake, caffeinated beverages, dairy products, fruits and vegetables): Drinks diet soda, water and coffee.   Breakfast: egg torilla Lunch: sandwich  Dinner: protein and vegetables    Encouraged to begin swimming again.   Goals      Patient Stated   . Patient states (pt-stated)          Maintain current health by staying active.       Depression Screen PHQ 2/9 Scores 09/17/2017 03/03/2016 09/03/2014  PHQ - 2 Score 2 2 1   PHQ- 9 Score 3 6 -    Fall Risk Fall Risk  09/17/2017 03/03/2016 09/03/2014  Falls in the past year? No No No    Cognitive Function: MMSE - Mini Mental State Exam 09/17/2017  Not completed: Refused        Screening Tests Health Maintenance  Topic Date Due  . FOOT EXAM  05/03/2017  . OPHTHALMOLOGY EXAM  06/28/2017  . HEMOGLOBIN A1C  07/16/2017  . INFLUENZA VACCINE  07/25/2017  . URINE MICROALBUMIN  08/03/2017  . PNA vac Low Risk Adult (2 of 2 - PPSV23) 08/03/2017  . COLONOSCOPY  06/10/2024  . TETANUS/TDAP  11/01/2026  . Hepatitis C Screening  Completed        Plan:    Shingles vaccine at pharmacy.  Start doing brain stimulating activities (puzzles, reading, adult coloring books, staying active) to keep memory sharp.   Bring a copy of your living will and/or healthcare power of attorney to your next office visit.   I have personally reviewed and noted the following in the patient's chart:   . Medical and social history . Use of alcohol, tobacco or illicit drugs  . Current medications and supplements . Functional ability and status . Nutritional status . Physical activity . Advanced directives . List of other physicians . Hospitalizations, surgeries, and ER visits in previous 12 months . Vitals . Screenings to include cognitive,  depression, and falls . Referrals and appointments  In addition, I have reviewed and discussed with patient certain preventive protocols, quality metrics, and best practice recommendations. A written personalized care plan for preventive services as well as general preventive health recommendations were  provided to patient.     Gerilyn Nestle, RN   09/17/2017

## 2017-09-17 NOTE — Progress Notes (Signed)
OFFICE VISIT  09/17/2017   CC:  Chief Complaint  Patient presents with  . Follow-up    RCI, pt is fasting.   . Medicare Wellness     HPI:    Patient is a 66 y.o. Caucasian male who presents for f/u DM 2, HTN, and CRI stage III with hx of kidney transplant. He is 14 yrs out from kidney transplant, last saw Transplant Clinic MD 04/12/17 and their note indicated "transplant function is stable and excellent. They recommended no changes in meds, continue q11mo o/v with them, repeat labs q3 mo here in [place]. Most recent local nephrologist f/u: July 2018, pt states labs were "really good".    Last time I saw him here it was for signs of upper GI bleeding (melena, hemoccult + here in office), Hb 14),  but he was stable and ok for outpt GI f/u with Dr. Oletta Lamas with [facility].  He never saw Dr. Oletta Lamas b/c it was going to be months before appt open.  Most likely gastritis, likely due to alcohol.  Pt says he had not been taking his pantoprazole at that time, so he restarted this and all melena resolved.  DM 2: glucose monitoring 118-120s.   Feet: no burning, tingling, or numbness in feet. He has purposefully lost about 15 lbs over the last 6 mo. Statin: tolerating rosuva well.  HTN: home monitoring consistently < 120/80.   (09/05/17 colonoscopy normal except diverticulosis and internal hemorrhoids).  Past Medical History:  Diagnosis Date  . Anxiety and depression    crowds, closed in spaces, used to sleep, prn  . Chronic renal insufficiency, stage III (moderate)    polycystic kidney dz. (followed by Dr. Mattingly/Nephrology).  . DDD (degenerative disc disease), lumbar   . Deep vein thrombosis (DVT) of left upper extremity (HCC)    due to PICC line  . 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 Diverticulitis   . Gastritis 01/2017   likely due to alcohol  . GERD (gastroesophageal reflux disease)   . Glaucoma   . Gout   . H/O nonmelanoma  skin cancer    SCC (chest, face, head)--s/p Moh's 2009  . H/O polycystic kidney disease   . History of depression   . History of kidney transplant    2004   . History of retinal detachment    Dr. Baird [facility]  . Hx of adenomatous colonic polyps 02/2012; 05/2014   tubular adenomas (Dr. Oletta Lamas)  . Hyperlipemia, mixed   . Hypertension   . Osteoarthritis    s/p R THR    Past Surgical History:  Procedure Laterality Date  . COLONOSCOPY  06/10/14   Tubular adenoma x 4: recall 3 yrs (Dr. [facility]d [facility])  . COLONOSCOPY  03/19/2012   tubular adenoma x 1; diverticulosis, int hem.Procedure: COLONOSCOPY;  Surgeon: Winfield Cunas., MD;  Location[facility] ENDOSCOPY;  Service: Endoscopy;  Laterality: N/A;  . HOT HEMOSTASIS  03/19/2012   Procedure: HOT HEMOSTASIS (ARGON PLASMA COAGULATION/BICAP);  Surgeon: Winfield Cunas., MD;  Location: [facility] ENDOSCOPY;  Service: Endoscopy;  Laterality: N/A;  . KIDNEY TRANSPLANT  2004  . TOTAL HIP ARTHROPLASTY  12/10/2012   Procedure: TOTAL HIP ARTHROPLASTY ANTERIOR APPROACH;  Surgeon: Mauri Pole, MD;  Location: [facility] ORS;  Service: Orthopedics;  Laterality: Right;    Outpatient Medications Prior to Visit  Medication Sig Dispense Refill  . ACCU-CHEK SMARTVIEW test strip 1 each by Other route daily. Check blood  once daily. 100 each 10  . allopurinol (ZYLOPRIM) 100 MG tablet Take 200 mg by mouth at bedtime.     . ALPRAZolam (XANAX) 0.25 MG tablet TAKE 1 TABLET BY MOUTH EVERY DAY AS NEEDED 30 tablet 5  . amLODipine (NORVASC) 2.5 MG tablet Take 5 mg by mouth daily.     . carboxymethylcellulose (REFRESH PLUS) 0.5 % SOLN 1 drop 3 (three) times daily as needed.    Marland Kitchen FLUoxetine (PROZAC) 40 MG capsule Take 40 mg by mouth daily. Patient takes in am    . glipiZIDE (GLUCOTROL) 5 MG tablet Take 5 mg by mouth. 2 tablets in the morning and 1 tablet in the evening    . Magnesium 400 MG CAPS Take 1 tablet by mouth 2 (two) times daily.    . metoprolol (LOPRESSOR) 50 MG tablet Take  50 mg by mouth 2 (two) times daily.    . mycophenolate (CELLCEPT) 250 MG capsule Take 250 mg by mouth 2 (two) times daily.     . pantoprazole (PROTONIX) 40 MG tablet Take 40 mg by mouth 2 (two) times daily.     . pioglitazone (ACTOS) 45 MG tablet TAKE 1 TABLET BY MOUTH DAILY 90 tablet 0  . predniSONE (DELTASONE) 5 MG tablet Take 5 mg by mouth daily.    . promethazine (PHENERGAN) 12.5 MG tablet 1-2 tabs po q6h prn nausea/vomiting 30 tablet 0  . rosuvastatin (CRESTOR) 20 MG tablet Take 1 tablet (20 mg total) by mouth daily. 30 tablet 5  . tacrolimus (PROGRAF) 0.5 MG capsule Take 1 capsule by mouth 2 (two) times daily.  6  . tacrolimus (PROGRAF) 1 MG capsule Take 1 mg by mouth 2 (two) times daily.     Marland Kitchen latanoprost (XALATAN) 0.005 % ophthalmic solution Place 1 drop into both eyes at bedtime.     No facility-administered medications prior to visit.     Allergies  Allergen Reactions  . Lipitor [Atorvastatin] Other (See Comments)    Headaches  . Zocor [Simvastatin] Other (See Comments)    Myalgia  . Penicillins Rash    ROS As per HPI  PE: Blood pressure 117/73, pulse 72, temperature 98.1 F (36.7 C), temperature source Oral, resp. rate 16, height 6' (1.829 m), weight 232 lb (105.2 kg), SpO2 96 %. Body mass index is 31.46 kg/m.  Gen: Alert, well appearing.  Patient is oriented to person, place, time, and situation. AFFECT: pleasant, lucid thought and speech. Foot exam - both sides normal; no swelling, tenderness or skin or vascular lesions. Color and temperature is normal. Sensation is intact except some loss of sensation to monofilament testing on plantar surface of great toe and MTP joint bilat. Peripheral pulses are palpable. Toenails are normal.   LABS:  Lab Results  Component Value Date   TSH 2.36 05/03/2016   Lab Results  Component Value Date   WBC 9.0 02/09/2017   HGB 14.1 02/09/2017   HCT 41.7 02/09/2017   MCV 98.6 02/09/2017   PLT 153.0 02/09/2017   Lab Results   Component Value Date   CREATININE 1.65 (H) 02/09/2017   BUN 45 (H) 02/09/2017   NA 134 (L) 02/09/2017   K 4.5 02/09/2017   CL 101 02/09/2017   CO2 23 02/09/2017   Lab Results  Component Value Date   ALT 18 02/09/2017   AST 16 02/09/2017   ALKPHOS 101 02/09/2017   BILITOT 1.0 02/09/2017   Lab Results  Component Value Date   CHOL 178 02/01/2017  Lab Results  Component Value Date   HDL 58.50 02/01/2017   No results found for: [facility] Lab Results  Component Value Date   TRIG 277.0 (H) 02/01/2017   Lab Results  Component Value Date   CHOLHDL 3 02/01/2017   Lab Results  Component Value Date   HGBA1C 6.2 01/16/2017    IMPRESSION AND PLAN:  1) DM 2: great control per home monitoring. Hb a1c with recent nephrology labs--obtain labs from their office. Eye exam UTD: 07/2017. Feet exam done today: some impaired sensation in great toes bilat. Pneumovax 23 today.  Flu vaccine today. Labs UTD: will get Dr. Etheleen Nicks last o/v note and labs. Tolerating statin--pt reports last lipids 07/14/2017 great at Dr. Mattingly's--get records.  2) HTN: The current medical regimen is effective;  continue present plan and medications. Get lytes/cr results from Dr. Etheleen Nicks last visit 07/14/17.  3) CRI stage III, hx of kidney transplant: stable. Keep q 6 mo f/u with transplant clinic Pt's local nephrologist retiring.  He requests referral to any nephrologist in [place] due to convenience to home.  4) Upper GI bleeding 01/2017: resolved quickly after getting back on PPI. Never got to see his GI for this due to lack of timely appt availability. Continue daily PPI, avoid NSAIDs, minimize alcohol.  An After Visit Summary was printed and given to the patient.  FOLLOW UP: Return in about 3 months (around 12/17/2017).  Signed:  Crissie Sickles, MD           09/17/2017

## 2017-09-17 NOTE — Patient Instructions (Signed)
Shingles vaccine at pharmacy.  Start doing brain stimulating activities (puzzles, reading, adult coloring books, staying active) to keep memory sharp.   Bring a copy of your living will and/or healthcare power of attorney to your next office visit.   Health Maintenance, Male A healthy lifestyle and preventive care is important for your health and wellness. Ask your health care provider about what schedule of regular examinations is right for you. What should I know about weight and diet? Eat a Healthy Diet  Eat plenty of vegetables, fruits, whole grains, low-fat dairy products, and lean protein.  Do not eat a lot of foods high in solid fats, added sugars, or salt.  Maintain a Healthy Weight Regular exercise can help you achieve or maintain a healthy weight. You should:  Do at least 150 minutes of exercise each week. The exercise should increase your heart rate and make you sweat (moderate-intensity exercise).  Do strength-training exercises at least twice a week.  Watch Your Levels of Cholesterol and Blood Lipids  Have your blood tested for lipids and cholesterol every 5 years starting at 67 years of age. If you are at high risk for heart disease, you should start having your blood tested when you are 67 years old. You may need to have your cholesterol levels checked more often if: ? Your lipid or cholesterol levels are high. ? You are older than 67 years of age. ? You are at high risk for heart disease.  What should I know about cancer screening? Many types of cancers can be detected early and may often be prevented. Lung Cancer  You should be screened every year for lung cancer if: ? You are a current smoker who has smoked for at least 30 years. ? You are a former smoker who has quit within the past 15 years.  Talk to your health care provider about your screening options, when you should start screening, and how often you should be screened.  Colorectal Cancer  Routine  colorectal cancer screening usually begins at 67 years of age and should be repeated every 5-10 years until you are 67 years old. You may need to be screened more often if early forms of precancerous polyps or small growths are found. Your health care provider may recommend screening at an earlier age if you have risk factors for colon cancer.  Your health care provider may recommend using home test kits to check for hidden blood in the stool.  A small camera at the end of a tube can be used to examine your colon (sigmoidoscopy or colonoscopy). This checks for the earliest forms of colorectal cancer.  Prostate and Testicular Cancer  Depending on your age and overall health, your health care provider may do certain tests to screen for prostate and testicular cancer.  Talk to your health care provider about any symptoms or concerns you have about testicular or prostate cancer.  Skin Cancer  Check your skin from head to toe regularly.  Tell your health care provider about any new moles or changes in moles, especially if: ? There is a change in a mole's size, shape, or color. ? You have a mole that is larger than a pencil eraser.  Always use sunscreen. Apply sunscreen liberally and repeat throughout the day.  Protect yourself by wearing long sleeves, pants, a wide-brimmed hat, and sunglasses when outside.  What should I know about heart disease, diabetes, and high blood pressure?  If you are 72-66 years of age, have  your blood pressure checked every 3-5 years. If you are 20 years of age or older, have your blood pressure checked every year. You should have your blood pressure measured twice-once when you are at a hospital or clinic, and once when you are not at a hospital or clinic. Record the average of the two measurements. To check your blood pressure when you are not at a hospital or clinic, you can use: ? An automated blood pressure machine at a pharmacy. ? A home blood pressure  monitor.  Talk to your health care provider about your target blood pressure.  If you are between 4-39 years old, ask your health care provider if you should take aspirin to prevent heart disease.  Have regular diabetes screenings by checking your fasting blood sugar level. ? If you are at a normal weight and have a low risk for diabetes, have this test once every three years after the age of 63. ? If you are overweight and have a high risk for diabetes, consider being tested at a younger age or more often.  A one-time screening for abdominal aortic aneurysm (AAA) by ultrasound is recommended for men aged 54-75 years who are current or former smokers. What should I know about preventing infection? Hepatitis B If you have a higher risk for hepatitis B, you should be screened for this virus. Talk with your health care provider to find out if you are at risk for hepatitis B infection. Hepatitis C Blood testing is recommended for:  Everyone born from [birth year] through 1966.  Anyone with known risk factors for hepatitis C.  Sexually Transmitted Diseases (STDs)  You should be screened each year for STDs including gonorrhea and chlamydia if: ? You are sexually active and are younger than 67 years of age. ? You are older than 67 years of age and your health care provider tells you that you are at risk for this type of infection. ? Your sexual activity has changed since you were last screened and you are at an increased risk for chlamydia or gonorrhea. Ask your health care provider if you are at risk.  Talk with your health care provider about whether you are at high risk of being infected with HIV. Your health care provider may recommend a prescription medicine to help prevent HIV infection.  What else can I do?  Schedule regular health, dental, and eye exams.  Stay current with your vaccines (immunizations).  Do not use any tobacco products, such as cigarettes, chewing tobacco, and  e-cigarettes. If you need help quitting, ask your health care provider.  Limit alcohol intake to no more than 2 drinks per day. One drink equals 12 ounces of beer, 5 ounces of wine, or 1 ounces of hard liquor.  Do not use street drugs.  Do not share needles.  Ask your health care provider for help if you need support or information about quitting drugs.  Tell your health care provider if you often feel depressed.  Tell your health care provider if you have ever been abused or do not feel safe at home. This information is not intended to replace advice given to you by your health care provider. Make sure you discuss any questions you have with your health care provider. Document Released: 06/08/2008 Document Revised: 08/09/2016 Document Reviewed: 09/14/2015 Elsevier Interactive Patient Education  [employer].

## 2017-09-17 NOTE — Progress Notes (Signed)
AWV reviewed and agree.  Signed:  Crissie Sickles, MD           09/17/2017

## 2017-10-14 ENCOUNTER — Other Ambulatory Visit: Payer: Self-pay | Admitting: Family Medicine

## 2017-10-15 ENCOUNTER — Encounter: Payer: Self-pay | Admitting: Family Medicine

## 2017-10-15 ENCOUNTER — Ambulatory Visit (INDEPENDENT_AMBULATORY_CARE_PROVIDER_SITE_OTHER): Payer: Medicare Other | Admitting: Family Medicine

## 2017-10-15 VITALS — BP 94/57 | HR 82 | Temp 98.1°F | Resp 16 | Ht 72.0 in | Wt 234.0 lb

## 2017-10-15 DIAGNOSIS — L820 Inflamed seborrheic keratosis: Secondary | ICD-10-CM | POA: Diagnosis not present

## 2017-10-15 NOTE — Progress Notes (Signed)
OFFICE VISIT  10/15/2017   CC:  Chief Complaint  Patient presents with  . Skin Lesion?    painful   HPI:    Patient is a 66 y.o. Caucasian male who presents for rash. Noted 3-4 d ago a feeling of a "raw sore" on R lateral thorax/rib cage area.  No fever, no malaise. Hurts when shirt rubs against it, otherwise NO PAIN.  No itching.  He sees no rash. He has hx of zoster rash but this was on L LB region in remote past. He has not had shingrix yet.    Past Medical History:  Diagnosis Date  . Anxiety and depression    crowds, closed in spaces, used to sleep, prn  . Chronic renal insufficiency, stage III (moderate) (HCC)    polycystic kidney dz. (followed by Dr. Mattingly/Nephrology).  . DDD (degenerative disc disease), lumbar   . Deep vein thrombosis (DVT) of left upper extremity (HCC)    due to PICC line  . 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 Diverticulitis   . Gastritis 01/2017   likely due to alcohol  . GERD (gastroesophageal reflux disease)   . Glaucoma   . Gout   . H/O nonmelanoma skin cancer    SCC (chest, face, head)--s/p Moh's 2009  . H/O polycystic kidney disease   . History of depression   . History of kidney transplant    2004   . History of retinal detachment    Dr. Baird [facility]  . Hx of adenomatous colonic polyps 02/2012; 05/2014   tubular adenomas (Dr. Oletta Lamas)  . Hyperlipemia, mixed   . Hypertension   . Osteoarthritis    s/p R THR    Past Surgical History:  Procedure Laterality Date  . COLONOSCOPY  06/10/14   Tubular adenoma x 4: recall 3 yrs (Dr. Alferd [facility])  . COLONOSCOPY  03/19/2012   tubular adenoma x 1; diverticulosis, int hem.Procedure: COLONOSCOPY;  Surgeon: Winfield Cunas., MD;  Location: [facility] ENDOSCOPY;  Service: Endoscopy;  Laterality: N/A;  . HOT HEMOSTASIS  03/19/2012   Procedure: HOT HEMOSTASIS (ARGON PLASMA COAGULATION/BICAP);  Surgeon: Winfield Cunas., MD;  Location: [facility]  ENDOSCOPY;  Service: Endoscopy;  Laterality: N/A;  . KIDNEY TRANSPLANT  2004  . TOTAL HIP ARTHROPLASTY  12/10/2012   Procedure: TOTAL HIP ARTHROPLASTY ANTERIOR APPROACH;  Surgeon: Mauri Pole, MD;  Location: [facility] ORS;  Service: Orthopedics;  Laterality: Right;    Outpatient Medications Prior to Visit  Medication Sig Dispense Refill  . ACCU-CHEK SMARTVIEW test strip USE 1 TEST STRIP DAILY TO TEST BLOOD SUGAR 100 each 11  . allopurinol (ZYLOPRIM) 100 MG tablet Take 200 mg by mouth at bedtime.     . ALPRAZolam (XANAX) 0.25 MG tablet TAKE 1 TABLET BY MOUTH EVERY DAY AS NEEDED 30 tablet 5  . amLODipine (NORVASC) 2.5 MG tablet Take 5 mg by mouth daily.     . carboxymethylcellulose (REFRESH PLUS) 0.5 % SOLN 1 drop 3 (three) times daily as needed.    Marland Kitchen FLUoxetine (PROZAC) 40 MG capsule Take 40 mg by mouth daily. Patient takes in am    . glipiZIDE (GLUCOTROL) 5 MG tablet Take 5 mg by mouth. 2 tablets in the morning and 1 tablet in the evening    . Magnesium 400 MG CAPS Take 1 tablet by mouth 2 (two) times daily.    . metoprolol (LOPRESSOR) 50 MG tablet Take 50 mg by mouth 2 (two)  times daily.    . mycophenolate (CELLCEPT) 250 MG capsule Take 250 mg by mouth 2 (two) times daily.     . pantoprazole (PROTONIX) 40 MG tablet Take 40 mg by mouth 2 (two) times daily.     . pioglitazone (ACTOS) 45 MG tablet TAKE 1 TABLET BY MOUTH DAILY 90 tablet 0  . predniSONE (DELTASONE) 5 MG tablet Take 5 mg by mouth daily.    . promethazine (PHENERGAN) 12.5 MG tablet 1-2 tabs po q6h prn nausea/vomiting 30 tablet 0  . rosuvastatin (CRESTOR) 20 MG tablet Take 1 tablet (20 mg total) by mouth daily. 30 tablet 5  . tacrolimus (PROGRAF) 0.5 MG capsule Take 1 capsule by mouth 2 (two) times daily.  6  . tacrolimus (PROGRAF) 1 MG capsule Take 1 mg by mouth 2 (two) times daily.     . bimatoprost (LUMIGAN) 0.01 % SOLN Apply 1 drop to eye at bedtime.     No facility-administered medications prior to visit.     Allergies   Allergen Reactions  . Lipitor [Atorvastatin] Other (See Comments)    Headaches  . Zocor [Simvastatin] Other (See Comments)    Myalgia  . Penicillins Rash    ROS As per HPI  PE: Blood pressure (!) 94/57, pulse 82, temperature 98.1 F (36.7 C), temperature source Oral, resp. rate 16, height 6' (1.829 m), weight 234 lb (106.1 kg), SpO2 95 %. Gen: Alert, well appearing.  Patient is oriented to person, place, time, and situation. AFFECT: pleasant, lucid thought and speech. SKIN: multiple "stuck-on" oval, light brown flat plaques that are palpable above the surrounding skin.  Several skin tags present.  In the exact area of soreness on R posterolateral rib cage, there is a small plaque as described above. No erythema, no vesicle, no pustule, no maceration.  LABS:  none  IMPRESSION AND PLAN:  1) Irritated seborrheic keratoses lesion: reassured pt. Place band-aid over this today so his shirt would stop abrading/irritating the area.  An After Visit Summary was printed and given to the patient.  FOLLOW UP: Return if symptoms worsen or fail to improve.  Signed:  Crissie Sickles, MD           10/15/2017

## 2017-10-19 ENCOUNTER — Other Ambulatory Visit: Payer: Self-pay | Admitting: Family Medicine

## 2017-10-19 NOTE — Telephone Encounter (Signed)
Rx faxed

## 2017-10-19 NOTE — Telephone Encounter (Signed)
[facility] [place]  RF request for alprazolam LOV: 09/17/17 Next ov: 12/10/17 Last written: 04/06/17 #30 w/ 5RF  Please advise. Thanks.

## 2017-10-24 DIAGNOSIS — H401223 Low-tension glaucoma, left eye, severe stage: Secondary | ICD-10-CM | POA: Diagnosis not present

## 2017-10-24 DIAGNOSIS — H04123 Dry eye syndrome of bilateral lacrimal glands: Secondary | ICD-10-CM | POA: Diagnosis not present

## 2017-10-24 DIAGNOSIS — H401213 Low-tension glaucoma, right eye, severe stage: Secondary | ICD-10-CM | POA: Diagnosis not present

## 2017-11-05 DIAGNOSIS — E785 Hyperlipidemia, unspecified: Secondary | ICD-10-CM | POA: Diagnosis not present

## 2017-11-05 DIAGNOSIS — E139 Other specified diabetes mellitus without complications: Secondary | ICD-10-CM | POA: Diagnosis not present

## 2017-11-05 DIAGNOSIS — M109 Gout, unspecified: Secondary | ICD-10-CM | POA: Diagnosis not present

## 2017-11-05 DIAGNOSIS — Z940 Kidney transplant status: Secondary | ICD-10-CM | POA: Diagnosis not present

## 2017-11-14 ENCOUNTER — Encounter: Payer: Self-pay | Admitting: Physician Assistant

## 2017-11-14 ENCOUNTER — Ambulatory Visit (INDEPENDENT_AMBULATORY_CARE_PROVIDER_SITE_OTHER): Payer: Medicare Other | Admitting: Physician Assistant

## 2017-11-14 VITALS — BP 120/78 | HR 72 | Temp 98.6°F | Resp 14 | Ht 72.0 in | Wt 234.0 lb

## 2017-11-14 DIAGNOSIS — L03119 Cellulitis of unspecified part of limb: Secondary | ICD-10-CM

## 2017-11-14 MED ORDER — DOXYCYCLINE HYCLATE 100 MG PO CAPS: 100.0000 mg | ORAL_CAPSULE | Freq: Two times a day (BID) | ORAL | 0 refills | Status: DC

## 2017-11-14 NOTE — Progress Notes (Signed)
Pre visit review using our clinic review tool, if applicable. No additional management support is needed unless otherwise documented below in the visit note. 

## 2017-11-14 NOTE — Patient Instructions (Signed)
Please start the antibiotic as directed. Keep skin clean and dry. Can apply small amount of topical antibiotic to the area. Follow-up with Dr. Anitra Lauth on Monday for reassessment.  ER for any worsening symptoms.

## 2017-11-14 NOTE — Progress Notes (Signed)
Patient presents to clinic today c/o dog scratch to dorsum of left hand occurring 3 days ago. States the dog was a pet and the patient was playing with the dog when he rested his paw on patients hand causing the scratch. Patient endorses washing and dressing the area immediately. Has noted some redness and tenderness over the past day without drainage. Denies fever, chills, malaise or fatigue. Patient with history of renal transplant, currently on anti-rejection therapies.  Past Medical History:  Diagnosis Date  . Anxiety and depression    crowds, closed in spaces, used to sleep, prn  . Chronic renal insufficiency, stage III (moderate) (HCC)    polycystic kidney dz. (followed by Dr. Mattingly/Nephrology).  . DDD (degenerative disc disease), lumbar   . Deep vein thrombosis (DVT) of left upper extremity (HCC)    due to PICC line  . 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 Diverticulitis   . Gastritis 01/2017   likely due to alcohol  . GERD (gastroesophageal reflux disease)   . Glaucoma   . Gout   . H/O nonmelanoma skin cancer    SCC (chest, face, head)--s/p Moh's 2009  . H/O polycystic kidney disease   . History of depression   . History of kidney transplant    2004   . History of retinal detachment    Dr. Baird Cancer  . Hx of adenomatous colonic polyps 02/2012; 05/2014   tubular adenomas (Dr. Oletta Lamas)  . Hyperlipemia, mixed   . Hypertension   . Osteoarthritis    s/p R THR    Current Outpatient Medications on File Prior to Visit  Medication Sig Dispense Refill  . ACCU-CHEK SMARTVIEW test strip USE 1 TEST STRIP DAILY TO TEST BLOOD SUGAR 100 each 11  . allopurinol (ZYLOPRIM) 100 MG tablet Take 200 mg by mouth at bedtime.     . ALPRAZolam (XANAX) 0.25 MG tablet TAKE 1 TABLET BY MOUTH EVERY DAY AS NEEDED 30 tablet 5  . amLODipine (NORVASC) 2.5 MG tablet Take 5 mg by mouth daily.     . carboxymethylcellulose (REFRESH PLUS) 0.5 % SOLN 1  drop 3 (three) times daily as needed.    Marland Kitchen FLUoxetine (PROZAC) 40 MG capsule Take 40 mg by mouth daily. Patient takes in am    . glipiZIDE (GLUCOTROL) 5 MG tablet Take 5 mg by mouth. 2 tablets in the morning and 1 tablet in the evening    . Magnesium 400 MG CAPS Take 1 tablet by mouth 2 (two) times daily.    . metoprolol (LOPRESSOR) 50 MG tablet Take 50 mg by mouth 2 (two) times daily.    . mycophenolate (CELLCEPT) 250 MG capsule Take 250 mg by mouth 2 (two) times daily.     . pantoprazole (PROTONIX) 40 MG tablet Take 40 mg by mouth 2 (two) times daily.     . pioglitazone (ACTOS) 45 MG tablet TAKE 1 TABLET BY MOUTH DAILY 90 tablet 0  . predniSONE (DELTASONE) 5 MG tablet Take 5 mg by mouth daily.    . promethazine (PHENERGAN) 12.5 MG tablet 1-2 tabs po q6h prn nausea/vomiting 30 tablet 0  . rosuvastatin (CRESTOR) 20 MG tablet Take 1 tablet (20 mg total) by mouth daily. 30 tablet 5  . tacrolimus (PROGRAF) 0.5 MG capsule Take 1 capsule by mouth 2 (two) times daily.  6  . tacrolimus (PROGRAF) 1 MG capsule Take 1 mg by mouth 2 (two) times daily.     Marland Kitchen  ZIOPTAN 0.0015 % SOLN Place 1 drop into both eyes at bedtime.  3   No current facility-administered medications on file prior to visit.     Allergies  Allergen Reactions  . Lipitor [Atorvastatin] Other (See Comments)    Headaches  . Zocor [Simvastatin] Other (See Comments)    Myalgia  . Penicillins Rash    Family History  Problem Relation Age of Onset  . Diabetes Mother   . Heart attack Mother   . Hyperlipidemia Father   . Kidney disease Father   . Hypertension Father   . Kidney disease Brother   . Heart disease Brother   . Diabetes Maternal Grandmother   . Kidney disease Paternal Grandfather   . Polycystic kidney disease Unknown   . Anesthesia problems Neg Hx   . Hypotension Neg Hx   . Malignant hyperthermia Neg Hx   . Pseudochol deficiency Neg Hx     Social History   Socioeconomic History  . Marital status: Married     Spouse name: None  . Number of children: None  . Years of education: None  . Highest education level: None  Social Needs  . Financial resource strain: None  . Food insecurity - worry: None  . Food insecurity - inability: None  . Transportation needs - medical: None  . Transportation needs - non-medical: None  Occupational History  . None  Tobacco Use  . Smoking status: Former Smoker    Packs/day: 2.00    Years: 30.00    Pack years: 60.00    Types: Cigarettes    Last attempt to quit: 12/26/1983    Years since quitting: 33.9  . Smokeless tobacco: Never Used  Substance and Sexual Activity  . Alcohol use: Yes    Alcohol/week: 16.8 oz    Types: 28 Glasses of wine per week    Comment: every day 3 glasses of wine  . Drug use: No  . Sexual activity: None  Other Topics Concern  . None  Social History Narrative   Married, one son.   Occupation: retired Merchant [employer] officer.   Orig from [place].   Former smoker x 20 yrs, quit in 34s.   Alcohol: 3 glasses of wine nightly, occ beer, rare hard liquor.   Review of Systems - See HPI.  All other ROS are negative.  BP 120/78   Pulse 72   Temp 98.6 F (37 C) (Oral)   Resp 14   Ht 6' (1.829 m)   Wt 234 lb (106.1 kg)   SpO2 97%   BMI 31.74 kg/m   Physical Exam  Constitutional: He is well-developed, well-nourished, and in no distress.  HENT:  Head: Normocephalic and atraumatic.  Eyes: Conjunctivae are normal.  Neck: Neck supple.  Cardiovascular: Normal rate, regular rhythm, normal heart sounds and intact distal pulses.  Pulmonary/Chest: Effort normal and breath sounds normal. No respiratory distress. He has no wheezes. He has no rales. He exhibits no tenderness.  Lymphadenopathy:    He has no cervical adenopathy.  Skin: Skin is warm and dry.     Vitals reviewed.    Assessment/Plan: 1. Cellulitis of hand Starting signs of cellulitis. Wound clean and dried. Bandage applied. Home wound care reviewed. Start Doxycycline for  infection to cover for MRSA, especially since patient immunocompromised. Close follow-up scheduled. Strict ER precautions reviewed.  - doxycycline (VIBRAMYCIN) 100 MG capsule; Take 1 capsule (100 mg total) by mouth 2 (two) times daily.  Dispense: 14 capsule; Refill: [place],  PA-C

## 2017-11-19 ENCOUNTER — Encounter: Payer: Self-pay | Admitting: Family Medicine

## 2017-11-19 ENCOUNTER — Other Ambulatory Visit: Payer: Self-pay

## 2017-11-19 ENCOUNTER — Ambulatory Visit (INDEPENDENT_AMBULATORY_CARE_PROVIDER_SITE_OTHER): Payer: Medicare Other | Admitting: Family Medicine

## 2017-11-19 VITALS — BP 109/67 | HR 74 | Temp 97.4°F | Resp 16 | Ht 72.0 in | Wt 232.5 lb

## 2017-11-19 DIAGNOSIS — R42 Dizziness and giddiness: Secondary | ICD-10-CM | POA: Diagnosis not present

## 2017-11-19 DIAGNOSIS — L089 Local infection of the skin and subcutaneous tissue, unspecified: Secondary | ICD-10-CM | POA: Diagnosis not present

## 2017-11-19 DIAGNOSIS — I952 Hypotension due to drugs: Secondary | ICD-10-CM

## 2017-11-19 DIAGNOSIS — T148XXA Other injury of unspecified body region, initial encounter: Secondary | ICD-10-CM | POA: Diagnosis not present

## 2017-11-19 DIAGNOSIS — I10 Essential (primary) hypertension: Secondary | ICD-10-CM

## 2017-11-19 NOTE — Progress Notes (Signed)
OFFICE VISIT  11/19/2017   CC:  Chief Complaint  Patient presents with  . Follow-up    cellulitis    HPI:    Patient is a 66 y.o. Caucasian male who presents for f/u left hand cellulitis. Doxycycline started 11/14/17 after being seen by another [facility] provider and dx'd with cellulitis of dorsum of right hand at the site of a superficial abrasion from the paw of his dog.  Hand is better.  Redness is gone.  No exudate.  He is taking abx as rx'd.  No fevers or malaise.  BP noted to be on low side today.  Has been low normal recently at different o/v.  He does endorse occ orthostatic dizziness in last month or so. Pt wonders if bp meds need adjusting.  Past Medical History:  Diagnosis Date  . Anxiety and depression    crowds, closed in spaces, used to sleep, prn  . Chronic renal insufficiency, stage III (moderate) (HCC)    polycystic kidney dz. (followed by Dr. Mattingly/Nephrology).  . DDD (degenerative disc disease), lumbar   . Deep vein thrombosis (DVT) of left upper extremity (HCC)    due to PICC line  . 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 Diverticulitis   . Gastritis 01/2017   likely due to alcohol  . GERD (gastroesophageal reflux disease)   . Glaucoma   . Gout   . H/O nonmelanoma skin cancer    SCC (chest, face, head)--s/p Moh's 2009  . H/O polycystic kidney disease   . History of depression   . History of kidney transplant    2004   . History of retinal detachment    Dr. Baird [facility]  . Hx of adenomatous colonic polyps 02/2012; 05/2014   tubular adenomas (Dr. Oletta Lamas)  . Hyperlipemia, mixed   . Hypertension   . Osteoarthritis    s/p R THR    Past Surgical History:  Procedure Laterality Date  . COLONOSCOPY  06/10/14   Tubular adenoma x 4: recall 3 yrs (Dr. [facility])  . COLONOSCOPY  03/19/2012   tubular adenoma x 1; diverticulosis, int hem.Procedure: COLONOSCOPY;  Surgeon: Winfield Cunas., MD;   Location: [facility] ENDOSCOPY;  Service: Endoscopy;  Laterality: N/A;  . HOT HEMOSTASIS  03/19/2012   Procedure: HOT HEMOSTASIS (ARGON PLASMA COAGULATION/BICAP);  Surgeon: Winfield Cunas., MD;  Location: [facility] ENDOSCOPY;  Service: Endoscopy;  Laterality: N/A;  . KIDNEY TRANSPLANT  2004  . TOTAL HIP ARTHROPLASTY  12/10/2012   Procedure: TOTAL HIP ARTHROPLASTY ANTERIOR APPROACH;  Surgeon: Mauri Pole, MD;  Location: [facility] ORS;  Service: Orthopedics;  Laterality: Right;    Outpatient Medications Prior to Visit  Medication Sig Dispense Refill  . ACCU-CHEK SMARTVIEW test strip USE 1 TEST STRIP DAILY TO TEST BLOOD SUGAR 100 each 11  . allopurinol (ZYLOPRIM) 100 MG tablet Take 200 mg by mouth at bedtime.     . ALPRAZolam (XANAX) 0.25 MG tablet TAKE 1 TABLET BY MOUTH EVERY DAY AS NEEDED 30 tablet 5  . carboxymethylcellulose (REFRESH PLUS) 0.5 % SOLN 1 drop 3 (three) times daily as needed.    . doxycycline (VIBRAMYCIN) 100 MG capsule Take 1 capsule (100 mg total) by mouth 2 (two) times daily. 14 capsule 0  . FLUoxetine (PROZAC) 40 MG capsule Take 40 mg by mouth daily. Patient takes in am    . glipiZIDE (GLUCOTROL) 5 MG tablet Take 5 mg by mouth. 2 tablets in the morning  and 1 tablet in the evening    . Magnesium 400 MG CAPS Take 1 tablet by mouth 2 (two) times daily.    . metoprolol (LOPRESSOR) 50 MG tablet Take 50 mg by mouth 2 (two) times daily.    . mycophenolate (CELLCEPT) 250 MG capsule Take 250 mg by mouth 2 (two) times daily.     . pantoprazole (PROTONIX) 40 MG tablet Take 40 mg by mouth 2 (two) times daily.     . pioglitazone (ACTOS) 45 MG tablet TAKE 1 TABLET BY MOUTH DAILY 90 tablet 0  . predniSONE (DELTASONE) 5 MG tablet Take 5 mg by mouth daily.    . promethazine (PHENERGAN) 12.5 MG tablet 1-2 tabs po q6h prn nausea/vomiting 30 tablet 0  . rosuvastatin (CRESTOR) 20 MG tablet Take 1 tablet (20 mg total) by mouth daily. 30 tablet 5  . tacrolimus (PROGRAF) 0.5 MG capsule Take 1 capsule by mouth 2  (two) times daily.  6  . tacrolimus (PROGRAF) 1 MG capsule Take 1 mg by mouth 2 (two) times daily.     Marland Kitchen ZIOPTAN 0.0015 % SOLN Place 1 drop into both eyes at bedtime.  3  . amLODipine (NORVASC) 2.5 MG tablet Take 5 mg by mouth daily.      No facility-administered medications prior to visit.     Allergies  Allergen Reactions  . Lipitor [Atorvastatin] Other (See Comments)    Headaches  . Zocor [Simvastatin] Other (See Comments)    Myalgia  . Penicillins Rash    ROS As per HPI  PE: Blood pressure 109/67, pulse 74, temperature (!) 97.4 F (36.3 C), temperature source Oral, resp. rate 16, height 6' (1.829 m), weight 232 lb 8 oz (105.5 kg), SpO2 92 %. Gen: Alert, well appearing.  Patient is oriented to person, place, time, and situation. AFFECT: pleasant, lucid thought and speech. Left hand: dorsal surface with approx 8 cm linear, superficial laceration without exudate or foul odor.  No fluctuance or significant erythema.  No tenderness to palpation.  No streaking.  LABS:  Lab Results  Component Value Date   WBC 9.0 02/09/2017   HGB 14.1 02/09/2017   HCT 41.7 02/09/2017   MCV 98.6 02/09/2017   PLT 153.0 02/09/2017     Chemistry      Component Value Date/Time   NA 134 (L) 02/09/2017 1542   NA 139 01/16/2017   K 4.5 02/09/2017 1542   CL 101 02/09/2017 1542   CO2 23 02/09/2017 1542   BUN 45 (H) 02/09/2017 1542   BUN 22 (A) 01/16/2017   CREATININE 1.65 (H) 02/09/2017 1542   GLU 147 01/16/2017      Component Value Date/Time   CALCIUM 9.1 02/09/2017 1542   ALKPHOS 101 02/09/2017 1542   AST 16 02/09/2017 1542   ALT 18 02/09/2017 1542   BILITOT 1.0 02/09/2017 1542     Lab Results  Component Value Date   HGBA1C 6.2 01/16/2017     IMPRESSION AND PLAN:  1) Left hand wound infection: dog scratch.  Td UTD. This is resolving appropriately, wound healing appropriately. He'll finish his doxycycline. Signs/symptoms to call or return for were reviewed and pt expressed  understanding.  2) HTN, with recent low/low normal bp's and occ orthostatic dizziness: will d/c his amlodipine 2.5mg  at this time. Continue to monitor home bp.  An After Visit Summary was printed and given to the patient.  FOLLOW UP: Return if symptoms worsen or fail to improve.  Signed:  Crissie Sickles, MD  11/19/2017     

## 2017-12-10 ENCOUNTER — Ambulatory Visit: Payer: Medicare Other | Admitting: Family Medicine

## 2017-12-23 ENCOUNTER — Other Ambulatory Visit: Payer: Self-pay | Admitting: Family Medicine

## 2018-02-04 DIAGNOSIS — I10 Essential (primary) hypertension: Secondary | ICD-10-CM | POA: Diagnosis not present

## 2018-02-04 DIAGNOSIS — E119 Type 2 diabetes mellitus without complications: Secondary | ICD-10-CM | POA: Diagnosis not present

## 2018-02-04 DIAGNOSIS — Z940 Kidney transplant status: Secondary | ICD-10-CM | POA: Diagnosis not present

## 2018-02-06 ENCOUNTER — Other Ambulatory Visit: Payer: Self-pay | Admitting: Family Medicine

## 2018-02-12 ENCOUNTER — Other Ambulatory Visit: Payer: Self-pay | Admitting: Family Medicine

## 2018-02-12 ENCOUNTER — Encounter: Payer: Self-pay | Admitting: Family Medicine

## 2018-02-18 DIAGNOSIS — E139 Other specified diabetes mellitus without complications: Secondary | ICD-10-CM | POA: Diagnosis not present

## 2018-02-18 DIAGNOSIS — Z940 Kidney transplant status: Secondary | ICD-10-CM | POA: Diagnosis not present

## 2018-02-18 LAB — CBC AND DIFFERENTIAL
HCT: 44 (ref 41–53)
HEMOGLOBIN: 14.5 (ref 13.5–17.5)
Platelets: 141 — AB (ref 150–399)
WBC: 6.5

## 2018-02-18 LAB — HEPATIC FUNCTION PANEL
ALK PHOS: 124 (ref 25–125)
ALT: 20 (ref 10–40)
AST: 25 (ref 14–40)
BILIRUBIN, TOTAL: 0.7

## 2018-02-18 LAB — LAB REPORT - SCANNED
Creatinine, Ser: 1.85
Glucose: 117
POTASSIUM, SERUM: 4.8
SODIUM, SERUM: 141

## 2018-02-18 LAB — HEMOGLOBIN A1C: HEMOGLOBIN A1C: 5.8

## 2018-02-21 DIAGNOSIS — E119 Type 2 diabetes mellitus without complications: Secondary | ICD-10-CM | POA: Diagnosis not present

## 2018-02-21 DIAGNOSIS — H04123 Dry eye syndrome of bilateral lacrimal glands: Secondary | ICD-10-CM | POA: Diagnosis not present

## 2018-02-21 DIAGNOSIS — H5213 Myopia, bilateral: Secondary | ICD-10-CM | POA: Diagnosis not present

## 2018-02-21 DIAGNOSIS — H52223 Regular astigmatism, bilateral: Secondary | ICD-10-CM | POA: Diagnosis not present

## 2018-02-21 DIAGNOSIS — H401213 Low-tension glaucoma, right eye, severe stage: Secondary | ICD-10-CM | POA: Diagnosis not present

## 2018-02-21 DIAGNOSIS — H401223 Low-tension glaucoma, left eye, severe stage: Secondary | ICD-10-CM | POA: Diagnosis not present

## 2018-02-21 DIAGNOSIS — H524 Presbyopia: Secondary | ICD-10-CM | POA: Diagnosis not present

## 2018-02-25 DIAGNOSIS — N2581 Secondary hyperparathyroidism of renal origin: Secondary | ICD-10-CM | POA: Diagnosis not present

## 2018-02-25 DIAGNOSIS — N183 Chronic kidney disease, stage 3 (moderate): Secondary | ICD-10-CM | POA: Diagnosis not present

## 2018-02-25 DIAGNOSIS — Z940 Kidney transplant status: Secondary | ICD-10-CM | POA: Diagnosis not present

## 2018-02-25 DIAGNOSIS — D631 Anemia in chronic kidney disease: Secondary | ICD-10-CM | POA: Diagnosis not present

## 2018-02-25 DIAGNOSIS — I10 Essential (primary) hypertension: Secondary | ICD-10-CM | POA: Diagnosis not present

## 2018-02-26 ENCOUNTER — Encounter: Payer: Self-pay | Admitting: Family Medicine

## 2018-02-26 DIAGNOSIS — H469 Unspecified optic neuritis: Secondary | ICD-10-CM | POA: Diagnosis not present

## 2018-02-26 LAB — HM DIABETES EYE EXAM

## 2018-03-01 ENCOUNTER — Other Ambulatory Visit: Payer: Self-pay | Admitting: Ophthalmology

## 2018-03-01 DIAGNOSIS — H469 Unspecified optic neuritis: Secondary | ICD-10-CM

## 2018-03-04 DIAGNOSIS — Z940 Kidney transplant status: Secondary | ICD-10-CM | POA: Diagnosis not present

## 2018-03-04 LAB — BASIC METABOLIC PANEL
BUN: 38 — AB (ref 4–21)
Creatinine: 1.8 — AB (ref <=1.3)
Glucose: 128
Potassium: 4.7 (ref 3.4–5.3)
SODIUM: 138 (ref 137–147)

## 2018-03-07 DIAGNOSIS — H02413 Mechanical ptosis of bilateral eyelids: Secondary | ICD-10-CM | POA: Diagnosis not present

## 2018-03-07 DIAGNOSIS — Z01818 Encounter for other preprocedural examination: Secondary | ICD-10-CM | POA: Diagnosis not present

## 2018-03-09 ENCOUNTER — Ambulatory Visit
Admission: RE | Admit: 2018-03-09 | Discharge: 2018-03-09 | Disposition: A | Discharge status: Home / Self Care | Payer: Medicare Other | Source: Ambulatory Visit | Attending: Ophthalmology | Admitting: Ophthalmology

## 2018-03-09 DIAGNOSIS — H409 Unspecified glaucoma: Secondary | ICD-10-CM | POA: Diagnosis not present

## 2018-03-09 DIAGNOSIS — H469 Unspecified optic neuritis: Secondary | ICD-10-CM

## 2018-03-11 ENCOUNTER — Encounter: Payer: Self-pay | Admitting: Family Medicine

## 2018-03-18 ENCOUNTER — Encounter: Payer: Self-pay | Admitting: Family Medicine

## 2018-03-25 DIAGNOSIS — H02412 Mechanical ptosis of left eyelid: Secondary | ICD-10-CM | POA: Diagnosis not present

## 2018-03-25 DIAGNOSIS — H02411 Mechanical ptosis of right eyelid: Secondary | ICD-10-CM | POA: Diagnosis not present

## 2018-03-25 DIAGNOSIS — H02413 Mechanical ptosis of bilateral eyelids: Secondary | ICD-10-CM | POA: Diagnosis not present

## 2018-03-25 DIAGNOSIS — H02403 Unspecified ptosis of bilateral eyelids: Secondary | ICD-10-CM | POA: Diagnosis not present

## 2018-04-02 DIAGNOSIS — H401231 Low-tension glaucoma, bilateral, mild stage: Secondary | ICD-10-CM | POA: Diagnosis not present

## 2018-04-23 ENCOUNTER — Telehealth: Payer: Self-pay | Admitting: Family Medicine

## 2018-04-23 NOTE — Telephone Encounter (Signed)
Refill request for metoprolol 50 MG tab  No note of last refill or provider  LOV  11/19/17 NOV  None scheduled Provider  Shawnie Dapper, MD  Pharmacy  [facility] [phone], [place]  Please review

## 2018-04-23 NOTE — Telephone Encounter (Signed)
Copied from [place] [id number]. Topic: Quick Communication - See Telephone Encounter >> Apr 23, 2018 12:19 PM Vernona Rieger wrote: CRM for notification. See Telephone encounter for: 04/23/18.  [facility] in [place] called [address] called for a refill  metoprolol (LOPRESSOR) 50 MG tablet Phone number is [phone] fax is [phone].

## 2018-04-24 MED ORDER — METOPROLOL TARTRATE 50 MG PO TABS: 50.0000 mg | ORAL_TABLET | Freq: Two times a day (BID) | ORAL | 6 refills | Status: DC

## 2018-04-24 NOTE — Telephone Encounter (Signed)
OK, metoprolol rx'd.

## 2018-05-02 ENCOUNTER — Other Ambulatory Visit: Payer: Self-pay | Admitting: Family Medicine

## 2018-05-02 NOTE — Telephone Encounter (Signed)
Will do 30d supply with 1 RF. Pt needs office f/u before any FURTHER RFs.-thx

## 2018-05-02 NOTE — Telephone Encounter (Signed)
[facility] [place]  RF request for alprazlam LOV: 09/17/17 Next ov: None Last written: 10/19/17 #30 w/ 5RF  Please advise. Thanks.

## 2018-05-02 NOTE — Telephone Encounter (Signed)
Rx faxed

## 2018-05-06 NOTE — Telephone Encounter (Signed)
Pt advised and voiced understanding.   He will call back to schedule an apt. 

## 2018-05-17 ENCOUNTER — Encounter: Payer: Self-pay | Admitting: Family Medicine

## 2018-05-17 ENCOUNTER — Ambulatory Visit (INDEPENDENT_AMBULATORY_CARE_PROVIDER_SITE_OTHER): Payer: Medicare Other | Admitting: Family Medicine

## 2018-05-17 VITALS — BP 116/72 | HR 75 | Temp 98.4°F | Resp 16 | Ht 72.0 in | Wt 244.5 lb

## 2018-05-17 DIAGNOSIS — N183 Chronic kidney disease, stage 3 unspecified: Secondary | ICD-10-CM

## 2018-05-17 DIAGNOSIS — I10 Essential (primary) hypertension: Secondary | ICD-10-CM | POA: Diagnosis not present

## 2018-05-17 DIAGNOSIS — E669 Obesity, unspecified: Secondary | ICD-10-CM

## 2018-05-17 DIAGNOSIS — E118 Type 2 diabetes mellitus with unspecified complications: Secondary | ICD-10-CM

## 2018-05-17 LAB — HEMOGLOBIN A1C: Hgb A1c MFr Bld: 6 % (ref 4.6–6.5)

## 2018-05-17 LAB — BASIC METABOLIC PANEL
BUN: 37 mg/dL — ABNORMAL HIGH (ref 6–23)
CHLORIDE: 105 meq/L (ref 96–112)
CO2: 23 mEq/L (ref 19–32)
Calcium: 9.1 mg/dL (ref 8.4–10.5)
Creatinine, Ser: 1.57 mg/dL — ABNORMAL HIGH (ref 0.40–1.50)
GFR: 46.95 mL/min — AB (ref >60.00)
Glucose, Bld: 123 mg/dL — ABNORMAL HIGH (ref 70–99)
POTASSIUM: 4.2 meq/L (ref 3.5–5.1)
SODIUM: 137 meq/L (ref 135–145)

## 2018-05-17 LAB — MICROALBUMIN / CREATININE URINE RATIO
CREATININE, U: 126.7 mg/dL
MICROALB UR: 5.5 mg/dL — AB (ref 0.0–1.9)
MICROALB/CREAT RATIO: 4.3 mg/g (ref 0.0–30.0)

## 2018-05-17 NOTE — Progress Notes (Signed)
OFFICE VISIT  05/17/2018   CC:  Chief Complaint  Patient presents with  . Follow-up    RCI, pt is not fasting.    HPI:    Patient is a 67 y.o. Caucasian male who presents for f/u DM 2, HTN, CRI III. I last saw him for routine f/u 8 mo ago.  I did, however, see him 10/2017 for low-to low-normal bps with some orthostatic dizziness, so I decreased his amlodipine to 2.5mg  qd at that time.  It now appears he is totally off amlodipine---pt not sure if he self d/c'd this med or if an MD stopped it.  DM 2: 100-120 fastings lately, compliant with meds. HTN: home bp's 130s/80s avg---he is now totally off amlodipine.  Of note, he purposefully had lost 15 lbs or so in 2018, then pains returned and prevented exercise, so he has gained this wt back--baseline wt now 244 lbs.  Recent [facility] transplant clinic f/u 02/04/18:  No changes at that time. That clinic noted-->Outside labs from 09/2017: BUN 23 Cr 1.29 Ca 9.4 Albumin 4.2  K 4.4  UA normal  UPC ratio normal  Tacrolimus 6.5   He was set to get some repeat labs via [facility] soon so they did not do labs at that visit.  Most recent local nephrology visit--->there was concern due to rising Cr (1.85 on 03/04/18) and there was plan to do renal allograft u/s.  He did not get the u/s done--he doesn't really know why--> he finds it hard to keep up with all his medical problems and MD plans.  Pt states Cr has remained stable since then.  ROS: no cough, no fevers, no abd pains, no groin pain, no n/v, no SOB, no CP, no polyuria or polydipsia  Past Medical History:  Diagnosis Date  . Anxiety and depression    crowds, closed in spaces, used to sleep, prn  . Chronic renal insufficiency, stage III (moderate) (HCC)    polycystic kidney dz. (followed by Dr. Patel/Nephrology)--kidney transplant 2004.  Cr rising a little: up to 1.85 feb 2019, 1.84 03/04/18--neph doing renal allograft u/s  . DDD (degenerative disc disease), lumbar   . Deep vein thrombosis  (DVT) of left upper extremity (HCC)    due to PICC line  . Diabetes mellitus with complication (HCC)    Mild non-proliferative diab retinopathy OS.  Hba1c at nephrologist's 02/18/18 was 5.8%.  . Diverticulitis   . Gastritis 01/2017   likely due to alcohol  . GERD (gastroesophageal reflux disease)   . Glaucoma   . Gout   . H/O nonmelanoma skin cancer    SCC (chest, face, head)--s/p Moh's 2009  . H/O polycystic kidney disease   . History of depression   . History of kidney transplant    2004   . History of retinal detachment    Dr. Baird Cancer  . Hx of adenomatous colonic polyps 02/2012; 05/2014   tubular adenomas (Dr. Oletta Lamas)  . Hyperlipemia, mixed   . Hypertension   . Osteoarthritis    s/p R THR    Past Surgical History:  Procedure Laterality Date  . COLONOSCOPY  06/10/14   Tubular adenoma x 4: recall 3 yrs (Dr. Alferd [facility])  . COLONOSCOPY  03/19/2012   tubular adenoma x 1; diverticulosis, int hem.Procedure: COLONOSCOPY;  Surgeon: Winfield Cunas., MD;  Location: [facility] ENDOSCOPY;  Service: Endoscopy;  Laterality: N/A;  . HOT HEMOSTASIS  03/19/2012   Procedure: HOT HEMOSTASIS (ARGON PLASMA COAGULATION/BICAP);  Surgeon: Winfield Cunas.,  MD;  Location: [facility] ENDOSCOPY;  Service: Endoscopy;  Laterality: N/A;  . KIDNEY TRANSPLANT  2004  . TOTAL HIP ARTHROPLASTY  12/10/2012   Procedure: TOTAL HIP ARTHROPLASTY ANTERIOR APPROACH;  Surgeon: Mauri Pole, MD;  Location: [facility] ORS;  Service: Orthopedics;  Laterality: Right;    Outpatient Medications Prior to Visit  Medication Sig Dispense Refill  . ACCU-CHEK SMARTVIEW test strip USE 1 TEST STRIP DAILY TO TEST BLOOD SUGAR 100 each 11  . allopurinol (ZYLOPRIM) 100 MG tablet Take 200 mg by mouth at bedtime.     . ALPRAZolam (XANAX) 0.25 MG tablet TAKE 1 TABLET BY MOUTH DAILY AS NEEDED 30 tablet 1  . carboxymethylcellulose (REFRESH PLUS) 0.5 % SOLN 1 drop 3 (three) times daily as needed.    Marland Kitchen FLUoxetine (PROZAC) 40 MG capsule Take 40 mg by  mouth daily. Patient takes in am    . glipiZIDE (GLUCOTROL) 5 MG tablet Take 5 mg by mouth. 2 tablets in the morning and 1 tablet in the evening    . Magnesium 400 MG CAPS Take 1 tablet by mouth 2 (two) times daily.    . metoprolol tartrate (LOPRESSOR) 50 MG tablet Take 1 tablet (50 mg total) by mouth 2 (two) times daily. 60 tablet 6  . mycophenolate (CELLCEPT) 250 MG capsule Take 250 mg by mouth 2 (two) times daily.     . pantoprazole (PROTONIX) 40 MG tablet Take 40 mg by mouth 2 (two) times daily.     . pioglitazone (ACTOS) 45 MG tablet TAKE 1 TABLET BY MOUTH DAILY 90 tablet 0  . predniSONE (DELTASONE) 5 MG tablet Take 5 mg by mouth daily.    . rosuvastatin (CRESTOR) 20 MG tablet TAKE 1 TABLET BY MOUTH DAILY 30 tablet 5  . tacrolimus (PROGRAF) 0.5 MG capsule Take 1 capsule by mouth 2 (two) times daily.  6  . tacrolimus (PROGRAF) 1 MG capsule Take 1 mg by mouth 2 (two) times daily.     Marland Kitchen ZIOPTAN 0.0015 % SOLN Place 1 drop into both eyes at bedtime.  3  . doxycycline (VIBRAMYCIN) 100 MG capsule Take 1 capsule (100 mg total) by mouth 2 (two) times daily. (Patient not taking: Reported on 05/17/2018) 14 capsule 0  . promethazine (PHENERGAN) 12.5 MG tablet 1-2 tabs po q6h prn nausea/vomiting (Patient not taking: Reported on 05/17/2018) 30 tablet 0   No facility-administered medications prior to visit.     Allergies  Allergen Reactions  . Lipitor [Atorvastatin] Other (See Comments)    Headaches  . Zocor [Simvastatin] Other (See Comments)    Myalgia  . Penicillins Rash    ROS As per HPI  PE: Blood pressure 116/72, pulse 75, temperature 98.4 F (36.9 C), temperature source Oral, resp. rate 16, height 6' (1.829 m), weight 244 lb 8 oz (110.9 kg), SpO2 96 %. Body mass index is 33.16 kg/m.  Gen: Alert, well appearing.  Patient is oriented to person, place, time, and situation. AFFECT: pleasant, lucid thought and speech. EXT: no edema No further exam today.  LABS:  Lab Results  Component  Value Date   TSH 2.36 05/03/2016   Lab Results  Component Value Date   WBC 6.5 02/18/2018   HGB 14.5 02/18/2018   HCT 44 02/18/2018   MCV 98.6 02/09/2017   PLT 141 (A) 02/18/2018   Lab Results  Component Value Date   CREATININE 1.8 (A) 03/04/2018   BUN 38 (A) 03/04/2018   NA 138 03/04/2018   K 4.7 03/04/2018  CL 101 02/09/2017   CO2 23 02/09/2017   Lab Results  Component Value Date   ALT 20 02/18/2018   AST 25 02/18/2018   ALKPHOS 124 02/18/2018   BILITOT 1.0 02/09/2017   Lab Results  Component Value Date   CHOL 178 02/01/2017   Lab Results  Component Value Date   HDL 58.50 02/01/2017   No results found for: [facility] Lab Results  Component Value Date   TRIG 277.0 (H) 02/01/2017   Lab Results  Component Value Date   CHOLHDL 3 02/01/2017   Lab Results  Component Value Date   HGBA1C 5.8 02/18/2018    IMPRESSION AND PLAN:  1) DM 2:The current medical regimen is effective;  continue present plan and medications. HbA1c today. Urine microalb/cr today. BMET today.  2) HTN: The current medical regimen is effective;  continue present plan and medications. BMET today.  3) CRI stage III (hx of renal transplant)--recent transient rise in sCr. Will recheck lytes/cr today.  An After Visit Summary was printed and given to the patient.  FOLLOW UP: 3 mo RCI  Signed:  Crissie Sickles, MD           05/20/2018

## 2018-07-08 DIAGNOSIS — N2581 Secondary hyperparathyroidism of renal origin: Secondary | ICD-10-CM | POA: Diagnosis not present

## 2018-07-08 DIAGNOSIS — Z940 Kidney transplant status: Secondary | ICD-10-CM | POA: Diagnosis not present

## 2018-07-08 DIAGNOSIS — E139 Other specified diabetes mellitus without complications: Secondary | ICD-10-CM | POA: Diagnosis not present

## 2018-07-08 DIAGNOSIS — M109 Gout, unspecified: Secondary | ICD-10-CM | POA: Diagnosis not present

## 2018-07-08 LAB — CBC AND DIFFERENTIAL
HCT: 46 (ref 41–53)
Hemoglobin: 14.9 (ref 13.5–17.5)
Platelets: 120 — AB (ref 150–399)
WBC: 6.3

## 2018-07-08 LAB — BASIC METABOLIC PANEL
BUN: 37 — AB (ref 4–21)
Creatinine: 1.7 — AB (ref 0.6–1.3)
GLUCOSE: 143
POTASSIUM: 4.5 (ref 3.4–5.3)
SODIUM: 139 (ref 137–147)

## 2018-07-08 LAB — HEPATIC FUNCTION PANEL
ALT: 19 (ref 10–40)
AST: 17 (ref 14–40)
Alkaline Phosphatase: 119 (ref 25–125)
Bilirubin, Total: 0.6

## 2018-07-10 DIAGNOSIS — D631 Anemia in chronic kidney disease: Secondary | ICD-10-CM | POA: Diagnosis not present

## 2018-07-10 DIAGNOSIS — I129 Hypertensive chronic kidney disease with stage 1 through stage 4 chronic kidney disease, or unspecified chronic kidney disease: Secondary | ICD-10-CM | POA: Diagnosis not present

## 2018-07-10 DIAGNOSIS — N2581 Secondary hyperparathyroidism of renal origin: Secondary | ICD-10-CM | POA: Diagnosis not present

## 2018-07-10 DIAGNOSIS — N183 Chronic kidney disease, stage 3 (moderate): Secondary | ICD-10-CM | POA: Diagnosis not present

## 2018-07-10 DIAGNOSIS — Z940 Kidney transplant status: Secondary | ICD-10-CM | POA: Diagnosis not present

## 2018-07-12 ENCOUNTER — Encounter: Payer: Self-pay | Admitting: Family Medicine

## 2018-07-19 ENCOUNTER — Ambulatory Visit: Payer: Self-pay | Admitting: Family Medicine

## 2018-07-19 NOTE — Telephone Encounter (Signed)
Pt's wife (wife is with pt) stated that husband began having diarrhea yesterday. Diarrhea wakes him from his sleep.  Pt is tolerating water but after drinking the ginger ale he vomited. Pt having watery stool. Onset was yesterday. Pt denies dry mouth but had weakness yesterday.  Pt denies abdominal Pain. Since pt is a diabetic and having severe diarrhea, recommended to go to [facility] within 4 hours. Advised pt to drink flat drinks with no carbonation. Pt does not have his blood sugar meter with him. Pt is out of town at [facility], [place]..  Pt advised to drink water or half strength powerade or Gatorade until feeling better. Advised pt if the diarrhea and vomiting continue, he needs to be seen at a local hospital ASAP. Advised pt to follow a clear liquid diet until able to keep fluids down. Then he can add soft bland foods with a little bit of salt added to food. Recommended bananas, applesauce, toast, pretzels, oatmeal with a little bit of salt added. Warned pt to not wait if unable to keep fluids down or if diarrhea continues. Discussed DKA and the needs for IV hydration and evaluation at a local ED ASAP. Pt was wanting to drive home. Advised against that due to his symptoms. Advised to stay and try and work on rehydration and feeling better before considering the drive home.  Care advise given per protocol. Pt and wife verbalized understanding. Reason for Disposition . [1] SEVERE diarrhea (e.g., 7 or more times / day more than normal) AND [2] age > 60 years    Pt is out of town at [facility].  Answer Assessment - Initial Assessment Questions 1. DIARRHEA SEVERITY: "How bad is the diarrhea?" "How many extra stools have you had in the past 24 hours than normal?"    - NO DIARRHEA (SCALE 0)   - MILD (SCALE 1-3): Few loose or mushy BMs; increase of 1-3 stools over normal daily number of stools; mild increase in ostomy output.   -  MODERATE (SCALE 4-7): Increase of 4-6 stools daily over normal; moderate  increase in ostomy output. * SEVERE (SCALE 8-10; OR 'WORST POSSIBLE'): Increase of 7 or more stools daily over normal; moderate increase in ostomy output; incontinence.     Wakes him up from sleep- severe 2. ONSET: "When did the diarrhea begin?"    yesterday 3. BM CONSISTENCY: "How loose or watery is the diarrhea?"      watery 4. VOMITING: "Are you also vomiting?" If so, ask: "How many times in the past 24 hours?"      Yes 1 time this am  5. ABDOMINAL PAIN: "Are you having any abdominal pain?" If yes: "What does it feel like?" (e.g., crampy, dull, intermittent, constant)      Yesterday- none today 6. ABDOMINAL PAIN SEVERITY: If present, ask: "How bad is the pain?"  (e.g., Scale 1-10; mild, moderate, or severe)   - MILD (1-3): doesn't interfere with normal activities, abdomen soft and not tender to touch    - MODERATE (4-7): interferes with normal activities or awakens from sleep, tender to touch    - SEVERE (8-10): excruciating pain, doubled over, unable to do any normal activities       None today 7. ORAL INTAKE: If vomiting, "Have you been able to drink liquids?" "How much fluids have you had in the past 24 hours?"     Drank water and after drinking ginger ale 8. HYDRATION: "Any signs of dehydration?" (e.g., dry mouth [not just  dry lips], too weak to stand, dizziness, new weight loss) "When did you last urinate?"     Was dizzy yesterday afternoon 9. EXPOSURE: "Have you traveled to a foreign country recently?" "Have you been exposed to anyone with diarrhea?" "Could you have eaten any food that was spoiled?"     No-no-no 10. ANTIBIOTIC USE: "Are you taking antibiotics now or have you taken antibiotics in the past 2 months?"       no 11. OTHER SYMPTOMS: "Do you have any other symptoms?" (e.g., fever, blood in stool)       no 12. PREGNANCY: "Is there any chance you are pregnant?" "When was your last menstrual period?"       n/a  Protocols used: DIARRHEA-A-AH

## 2018-07-23 ENCOUNTER — Encounter: Payer: Self-pay | Admitting: Family Medicine

## 2018-07-27 ENCOUNTER — Other Ambulatory Visit: Payer: Self-pay | Admitting: Family Medicine

## 2018-08-05 DIAGNOSIS — I10 Essential (primary) hypertension: Secondary | ICD-10-CM | POA: Diagnosis not present

## 2018-08-05 DIAGNOSIS — E119 Type 2 diabetes mellitus without complications: Secondary | ICD-10-CM | POA: Diagnosis not present

## 2018-08-05 DIAGNOSIS — Z940 Kidney transplant status: Secondary | ICD-10-CM | POA: Diagnosis not present

## 2018-08-16 ENCOUNTER — Ambulatory Visit: Payer: Medicare Other | Admitting: Family Medicine

## 2018-08-22 ENCOUNTER — Encounter: Payer: Self-pay | Admitting: Family Medicine

## 2018-08-22 DIAGNOSIS — H35373 Puckering of macula, bilateral: Secondary | ICD-10-CM | POA: Diagnosis not present

## 2018-08-22 DIAGNOSIS — H401223 Low-tension glaucoma, left eye, severe stage: Secondary | ICD-10-CM | POA: Diagnosis not present

## 2018-08-22 DIAGNOSIS — H04123 Dry eye syndrome of bilateral lacrimal glands: Secondary | ICD-10-CM | POA: Diagnosis not present

## 2018-08-22 DIAGNOSIS — H401213 Low-tension glaucoma, right eye, severe stage: Secondary | ICD-10-CM | POA: Diagnosis not present

## 2018-08-22 DIAGNOSIS — E119 Type 2 diabetes mellitus without complications: Secondary | ICD-10-CM | POA: Diagnosis not present

## 2018-08-22 LAB — HM DIABETES EYE EXAM

## 2018-08-29 ENCOUNTER — Other Ambulatory Visit: Payer: Self-pay | Admitting: Family Medicine

## 2018-08-30 ENCOUNTER — Ambulatory Visit (INDEPENDENT_AMBULATORY_CARE_PROVIDER_SITE_OTHER): Payer: Medicare Other | Admitting: Family Medicine

## 2018-08-30 ENCOUNTER — Encounter: Payer: Self-pay | Admitting: Family Medicine

## 2018-08-30 VITALS — BP 136/74 | HR 73 | Temp 98.0°F | Resp 16 | Ht 72.0 in | Wt 245.0 lb

## 2018-08-30 DIAGNOSIS — Z23 Encounter for immunization: Secondary | ICD-10-CM

## 2018-08-30 DIAGNOSIS — E7800 Pure hypercholesterolemia, unspecified: Secondary | ICD-10-CM

## 2018-08-30 DIAGNOSIS — L989 Disorder of the skin and subcutaneous tissue, unspecified: Secondary | ICD-10-CM | POA: Diagnosis not present

## 2018-08-30 DIAGNOSIS — E669 Obesity, unspecified: Secondary | ICD-10-CM | POA: Diagnosis not present

## 2018-08-30 DIAGNOSIS — Z85828 Personal history of other malignant neoplasm of skin: Secondary | ICD-10-CM

## 2018-08-30 DIAGNOSIS — I10 Essential (primary) hypertension: Secondary | ICD-10-CM

## 2018-08-30 DIAGNOSIS — E118 Type 2 diabetes mellitus with unspecified complications: Secondary | ICD-10-CM | POA: Diagnosis not present

## 2018-08-30 LAB — POCT GLYCOSYLATED HEMOGLOBIN (HGB A1C): HbA1c, POC (controlled diabetic range): 6 % (ref 0.0–7.0)

## 2018-08-30 NOTE — Progress Notes (Signed)
OFFICE VISIT  08/30/2018   CC:  Chief Complaint  Patient presents with  . Follow-up    RCI, pt is not fasting.    HPI:    Patient is a 67 y.o. Caucasian male who presents for 3 mo f/u DM 2, HTN, HLD. He is s/p kidney transplant and has CRI III, followed by [facility]. Feeling fine.  DM: fastings 120-130.  Diabetic diet--fair.   Exercise: "very little".  Walks the beach when he is staying at his beach house.  HTN: consistently 130s/70s  HLD: compliant with/tolerating statin.  ROS: no CP, no SOB, no wheezing, no cough, no dizziness, no HAs, no rashes, no melena/hematochezia.  No polyuria or polydipsia.   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     Past Medical History:  Diagnosis Date  . Anxiety and depression    crowds, closed in spaces, used to sleep, prn  . Chronic renal insufficiency, stage III (moderate) (HCC)    polycystic kidney dz. (followed by Dr. Patel/Nephrology)--kidney transplant 2004.  Cr rising a little: up to 1.85 feb 2019, 1.84.  Cr 1.68, GFR 41  on 07/08/18.  . DDD (degenerative disc disease), lumbar   . Deep vein thrombosis (DVT) of left upper extremity (HCC)    due to PICC line  . Diabetes mellitus with complication (HCC)    Mild non-proliferative diab retinopathy OS.  Hba1c at nephrologist's 02/18/18 was 5.8%.  . Diverticulitis   . Gastritis 01/2017   likely due to alcohol  . GERD (gastroesophageal reflux disease)   . Glaucoma   . Gout   . H/O nonmelanoma skin cancer    SCC (chest, face, head)--s/p Moh's 2009  . H/O polycystic kidney disease   . History of depression   . History of kidney transplant    2004   . History of retinal detachment    Dr. Baird [facility]  . Hx of adenomatous colonic polyps 02/2012; 05/2014   tubular adenomas (Dr. Oletta Lamas)  . Hyperlipemia, mixed   . Hypertension   . Osteoarthritis    s/p R THR    Past Surgical History:  Procedure Laterality Date  . COLONOSCOPY   06/10/14   Tubular adenoma x 4: recall 3 yrs (Dr. Alferd [facility])  . COLONOSCOPY  03/19/2012   tubular adenoma x 1; diverticulosis, int hem.Procedure: COLONOSCOPY;  Surgeon: Winfield Cunas., MD;  Location: [facility] ENDOSCOPY;  Service: Endoscopy;  Laterality: N/A;  . HOT HEMOSTASIS  03/19/2012   Procedure: HOT HEMOSTASIS (ARGON PLASMA COAGULATION/BICAP);  Surgeon: Winfield Cunas., MD;  Location: [facility] ENDOSCOPY;  Service: Endoscopy;  Laterality: N/A;  . KIDNEY TRANSPLANT  2004  . TOTAL HIP ARTHROPLASTY  12/10/2012   Procedure: TOTAL HIP ARTHROPLASTY ANTERIOR APPROACH;  Surgeon: Mauri Pole, MD;  Location: [facility] ORS;  Service: Orthopedics;  Laterality: Right;    Outpatient Medications Prior to Visit  Medication Sig Dispense Refill  . ACCU-CHEK SMARTVIEW test strip USE 1 TEST STRIP DAILY TO TEST BLOOD SUGAR 100 each 11  . allopurinol (ZYLOPRIM) 100 MG tablet Take 200 mg by mouth at bedtime.     . ALPRAZolam (XANAX) 0.25 MG tablet TAKE 1 TABLET BY MOUTH DAILY AS NEEDED 30 tablet 1  . carboxymethylcellulose (REFRESH PLUS) 0.5 % SOLN 1 drop 3 (three) times daily as needed.    Marland Kitchen FLUoxetine (PROZAC) 40 MG capsule Take 40 mg by mouth daily. Patient takes in am    . glipiZIDE (GLUCOTROL) 5  MG tablet Take 5 mg by mouth. 2 tablets in the morning and 1 tablet in the evening    . Magnesium 400 MG CAPS Take 1 tablet by mouth 2 (two) times daily.    . metoprolol tartrate (LOPRESSOR) 50 MG tablet Take 1 tablet (50 mg total) by mouth 2 (two) times daily. 60 tablet 6  . mycophenolate (CELLCEPT) 250 MG capsule Take 250 mg by mouth 2 (two) times daily.     . pantoprazole (PROTONIX) 40 MG tablet Take 40 mg by mouth 2 (two) times daily.     . pioglitazone (ACTOS) 45 MG tablet TAKE 1 TABLET BY MOUTH DAILY 90 tablet 0  . predniSONE (DELTASONE) 5 MG tablet Take 5 mg by mouth daily.    . rosuvastatin (CRESTOR) 20 MG tablet TAKE 1 TABLET BY MOUTH DAILY 90 tablet 0  . tacrolimus (PROGRAF) 0.5 MG capsule Take 1 capsule  by mouth 2 (two) times daily.  6  . tacrolimus (PROGRAF) 1 MG capsule Take 1 mg by mouth 2 (two) times daily.     Marland Kitchen ZIOPTAN 0.0015 % SOLN Place 1 drop into both eyes at bedtime.  3   No facility-administered medications prior to visit.     Allergies  Allergen Reactions  . Lipitor [Atorvastatin] Other (See Comments)    Headaches  . Zocor [Simvastatin] Other (See Comments)    Myalgia  . Penicillins Rash    ROS As per HPI  PE: Blood pressure 136/74, pulse 73, temperature 98 F (36.7 C), temperature source Oral, resp. rate 16, height 6' (1.829 m), weight 245 lb (111.1 kg), SpO2 95 %. Body mass index is 33.23 kg/m.  Gen: Alert, well appearing.  Patient is oriented to person, place, time, and situation. AFFECT: pleasant, lucid thought and speech. Scalp: 3 mm oval pink papules that is crusted and has slight central depression. CV: RRR, no m/r/g.   LUNGS: CTA bilat, nonlabored resps, good aeration in all lung fields. EXT: no clubbing or cyanosis.  no edema.    LABS:  Lab Results  Component Value Date   TSH 2.36 05/03/2016   Lab Results  Component Value Date   WBC 6.3 07/08/2018   HGB 14.9 07/08/2018   HCT 46 07/08/2018   MCV 98.6 02/09/2017   PLT 120 (A) 07/08/2018   Lab Results  Component Value Date   CREATININE 1.7 (A) 07/08/2018   BUN 37 (A) 07/08/2018   NA 139 07/08/2018   K 4.5 07/08/2018   CL 105 05/17/2018   CO2 23 05/17/2018   Lab Results  Component Value Date   ALT 19 07/08/2018   AST 17 07/08/2018   ALKPHOS 119 07/08/2018   BILITOT 1.0 02/09/2017   Lab Results  Component Value Date   CHOL 178 02/01/2017   Lab Results  Component Value Date   HDL 58.50 02/01/2017   No results found for: [facility] Lab Results  Component Value Date   TRIG 277.0 (H) 02/01/2017   Lab Results  Component Value Date   CHOLHDL 3 02/01/2017   Lab Results  Component Value Date   HGBA1C 6.0 05/17/2018   POC HbA1c today= 6.0%  IMPRESSION AND PLAN:  1) DM 2: well  controlled. POC A1c today: 6.0%. The current medical regimen is effective;  continue present plan and medications.  2) HTN: The current medical regimen is effective;  continue present plan and medications.  3) HLD: tolerating statin.  Last lipid panel was 18 mo ago-->pt not fasting today. He promises to  come to next f/u visit in 3 mo fasting so we can draw lipid panel.  3) Scalp skin lesion: suspicious for recurrence of scalp BCC. Refer to derm today (pt used to see Dr. Tamala Julian but requests referral to different dermatologist at this time).  He will be getting his routine blood work with nephrology around 10/08/18 so I chose not to do those today.  An After Visit Summary was printed and given to the patient.  FOLLOW UP: Return in about 3 months (around 11/29/2018) for routine chronic illness f/u (fasting-->early morning appt needed).  Signed:  Crissie Sickles, MD           08/30/2018

## 2018-09-10 ENCOUNTER — Telehealth: Payer: Self-pay | Admitting: Family Medicine

## 2018-09-10 NOTE — Telephone Encounter (Signed)
Error

## 2018-09-13 DIAGNOSIS — H35373 Puckering of macula, bilateral: Secondary | ICD-10-CM | POA: Diagnosis not present

## 2018-09-13 DIAGNOSIS — H3581 Retinal edema: Secondary | ICD-10-CM | POA: Diagnosis not present

## 2018-09-13 DIAGNOSIS — H43811 Vitreous degeneration, right eye: Secondary | ICD-10-CM | POA: Diagnosis not present

## 2018-09-14 ENCOUNTER — Other Ambulatory Visit: Payer: Self-pay | Admitting: Family Medicine

## 2018-09-16 DIAGNOSIS — L820 Inflamed seborrheic keratosis: Secondary | ICD-10-CM | POA: Diagnosis not present

## 2018-09-16 DIAGNOSIS — D0461 Carcinoma in situ of skin of right upper limb, including shoulder: Secondary | ICD-10-CM | POA: Diagnosis not present

## 2018-09-16 DIAGNOSIS — L570 Actinic keratosis: Secondary | ICD-10-CM | POA: Diagnosis not present

## 2018-09-16 DIAGNOSIS — D485 Neoplasm of uncertain behavior of skin: Secondary | ICD-10-CM | POA: Diagnosis not present

## 2018-09-16 DIAGNOSIS — C4442 Squamous cell carcinoma of skin of scalp and neck: Secondary | ICD-10-CM | POA: Diagnosis not present

## 2018-09-16 DIAGNOSIS — L821 Other seborrheic keratosis: Secondary | ICD-10-CM | POA: Diagnosis not present

## 2018-09-16 NOTE — Telephone Encounter (Signed)
RF request for alprazolam LOV: 08/30/18 Next ov: 11/27/18 Last written: 04/27/18 #30 w/ 1RF  Please advise. Thanks.

## 2018-09-23 ENCOUNTER — Ambulatory Visit (INDEPENDENT_AMBULATORY_CARE_PROVIDER_SITE_OTHER): Payer: Medicare Other

## 2018-09-23 ENCOUNTER — Other Ambulatory Visit: Payer: Self-pay

## 2018-09-23 VITALS — BP 112/60 | HR 90 | Ht 72.0 in | Wt 246.2 lb

## 2018-09-23 DIAGNOSIS — E669 Obesity, unspecified: Secondary | ICD-10-CM | POA: Diagnosis not present

## 2018-09-23 DIAGNOSIS — Z0000 Encounter for general adult medical examination without abnormal findings: Secondary | ICD-10-CM | POA: Diagnosis not present

## 2018-09-23 NOTE — Progress Notes (Signed)
Subjective:   James Zimmerman is a 67 y.o. male who presents for Medicare Annual/Subsequent preventive examination.  Review of Systems:  No ROS.  Medicare Wellness Visit. Additional risk factors are reflected in the social history.  Cardiac Risk Factors include: advanced age (>19men, >33 women);diabetes mellitus;male gender;dyslipidemia;hypertension;obesity (BMI >30kg/m2);sedentary lifestyle;family history of premature cardiovascular disease   Sleep patterns: Sleeps 6-7 hours, up to void 4-5 times.  Home Safety/Smoke Alarms: Feels safe in home. Smoke alarms in place.  Living environment; residence and Firearm Safety: Lives with wife in 2 story home.  Seat Belt Safety/Bike Helmet: Wears seat belt.    Male:   CCS-Colonoscopy 09/05/2017, pt reports diverticulitis. Recall 5 years.       PSA- No results found for: PSA      Objective:    Vitals: BP 112/60 (BP Location: Left Arm, Patient Position: Sitting, Cuff Size: Normal)   Pulse 90   Ht 6' (1.829 m)   Wt 246 lb 4 oz (111.7 kg)   SpO2 95%   BMI 33.40 kg/m   Body mass index is 33.4 kg/m.  Advanced Directives 09/23/2018 09/17/2017 09/03/2014 12/10/2012 12/04/2012 03/19/2012 02/12/2012  Does Patient Have a Medical Advance Directive? No No No Patient does not have advance directive Patient does not have advance directive Patient does not have advance directive Patient would not like information;Patient does not have advance directive  Would patient like information on creating a medical advance directive? No - Patient declined No - Patient declined No - patient declined information - - - -  Pre-existing out of facility DNR order (yellow form or pink MOST form) - - - - No No No    Tobacco Social History   Tobacco Use  Smoking Status Former Smoker  . Packs/day: 2.00  . Years: 30.00  . Pack years: 60.00  . Types: Cigarettes  . Last attempt to quit: 12/26/1983  . Years since quitting: 34.7  Smokeless Tobacco Never Used       Counseling given: Not Answered    Past Medical History:  Diagnosis Date  . Anxiety and depression    crowds, closed in spaces, used to sleep, prn  . Chronic renal insufficiency, stage III (moderate) (HCC)    polycystic kidney dz. (followed by Dr. Patel/Nephrology)--kidney transplant 2004.  Cr rising a little: up to 1.85 feb 2019, 1.84.  Cr 1.68, GFR 41  on 07/08/18.  . DDD (degenerative disc disease), lumbar   . Deep vein thrombosis (DVT) of left upper extremity (HCC)    due to PICC line  . Diabetes mellitus with complication (HCC)    Mild non-proliferative diab retinopathy OS.  Hba1c at nephrologist's 02/18/18 was 5.8%.  . Diverticulitis   . Gastritis 01/2017   likely due to alcohol  . GERD (gastroesophageal reflux disease)   . Glaucoma   . Gout   . H/O nonmelanoma skin cancer    SCC (chest, face, head)--s/p Moh's 2009  . H/O polycystic kidney disease   . History of depression   . History of kidney transplant    2004   . History of retinal detachment    Dr. Baird Cancer  . Hx of adenomatous colonic polyps 02/2012; 05/2014   tubular adenomas (Dr. Oletta Lamas)  . Hyperlipemia, mixed   . Hypertension   . Osteoarthritis    s/p R THR   Past Surgical History:  Procedure Laterality Date  . COLONOSCOPY  06/10/14   Tubular adenoma x 4: recall 3 yrs (Dr. Alferd [facility])  . COLONOSCOPY  03/19/2012   tubular adenoma x 1; diverticulosis, int hem.Procedure: COLONOSCOPY;  Surgeon: Winfield Cunas., MD;  Location[facility] ENDOSCOPY;  Service: Endoscopy;  Laterality: N/A;  . HOT HEMOSTASIS  03/19/2012   Procedure: HOT HEMOSTASIS (ARGON PLASMA COAGULATION/BICAP);  Surgeon: Winfield Cunas., MD;  Location: [facility] ENDOSCOPY;  Service: Endoscopy;  Laterality: N/A;  . KIDNEY TRANSPLANT  2004  . TOTAL HIP ARTHROPLASTY  12/10/2012   Procedure: TOTAL HIP ARTHROPLASTY ANTERIOR APPROACH;  Surgeon: Mauri Pole, MD;  Location: [facility] ORS;  Service: Orthopedics;  Laterality: Right;   Family History  Problem  Relation Age of Onset  . Diabetes Mother   . Heart attack Mother   . Hyperlipidemia Father   . Kidney disease Father   . Hypertension Father   . Kidney disease Brother   . Heart disease Brother   . Diabetes Maternal Grandmother   . Kidney disease Paternal Grandfather   . Polycystic kidney disease Unknown   . Anesthesia problems Neg Hx   . Hypotension Neg Hx   . Malignant hyperthermia Neg Hx   . Pseudochol deficiency Neg Hx    Social History   Socioeconomic History  . Marital status: Married    Spouse name: Not on file  . Number of children: Not on file  . Years of education: Not on file  . Highest education level: Not on file  Occupational History  . Not on file  Social Needs  . Financial resource strain: Not on file  . Food insecurity:    Worry: Not on file    Inability: Not on file  . Transportation needs:    Medical: Not on file    Non-medical: Not on file  Tobacco Use  . Smoking status: Former Smoker    Packs/day: 2.00    Years: 30.00    Pack years: 60.00    Types: Cigarettes    Last attempt to quit: 12/26/1983    Years since quitting: 34.7  . Smokeless tobacco: Never Used  Substance and Sexual Activity  . Alcohol use: Yes    Alcohol/week: 28.0 standard drinks    Types: 28 Glasses of wine per week    Comment: every day 3 glasses of wine  . Drug use: No  . Sexual activity: Not on file  Lifestyle  . Physical activity:    Days per week: Not on file    Minutes per session: Not on file  . Stress: Not on file  Relationships  . Social connections:    Talks on phone: Not on file    Gets together: Not on file    Attends religious service: Not on file    Active member of club or organization: Not on file    Attends meetings of clubs or organizations: Not on file    Relationship status: Not on file  Other Topics Concern  . Not on file  Social History Narrative   Married, one son.   Occupation: retired Merchant [employer] officer.   Orig from [place].   Former smoker  x 20 yrs, quit in 32s.   Alcohol: 3 glasses of wine nightly, occ beer, rare hard liquor.    Outpatient Encounter Medications as of 09/23/2018  Medication Sig  . ACCU-CHEK SMARTVIEW test strip USE 1 TEST STRIP DAILY TO TEST BLOOD SUGAR  . allopurinol (ZYLOPRIM) 100 MG tablet Take 200 mg by mouth at bedtime.   . ALPRAZolam (XANAX) 0.25 MG tablet TAKE 1 TABLET BY MOUTH EVERY DAY AS NEEDED  . amLODipine (  NORVASC) 5 MG tablet Take 5 mg by mouth daily.  . carboxymethylcellulose (REFRESH PLUS) 0.5 % SOLN 1 drop 3 (three) times daily as needed.  Marland Kitchen FLUoxetine (PROZAC) 40 MG capsule Take 40 mg by mouth daily. Patient takes in am  . glipiZIDE (GLUCOTROL) 5 MG tablet Take 5 mg by mouth. 2 tablets in the morning and 1 tablet in the evening  . MAGNESIUM-OXIDE 400 (241.3 Mg) MG tablet TK 1 T PO TID  . metoprolol tartrate (LOPRESSOR) 50 MG tablet Take 1 tablet (50 mg total) by mouth 2 (two) times daily.  . mycophenolate (CELLCEPT) 250 MG capsule Take 250 mg by mouth 2 (two) times daily.   . pantoprazole (PROTONIX) 40 MG tablet Take 40 mg by mouth 2 (two) times daily.   . pioglitazone (ACTOS) 45 MG tablet TAKE 1 TABLET BY MOUTH DAILY  . predniSONE (DELTASONE) 5 MG tablet Take 5 mg by mouth daily.  . rosuvastatin (CRESTOR) 20 MG tablet TAKE 1 TABLET BY MOUTH DAILY  . tacrolimus (PROGRAF) 0.5 MG capsule Take 1 capsule by mouth 2 (two) times daily.  . tacrolimus (PROGRAF) 1 MG capsule Take 1 mg by mouth 2 (two) times daily.   Marland Kitchen ZIOPTAN 0.0015 % SOLN Place 1 drop into both eyes at bedtime.  . [DISCONTINUED] Magnesium 400 MG CAPS Take 1 tablet by mouth 2 (two) times daily.   No facility-administered encounter medications on file as of 09/23/2018.     Activities of Daily Living In your present state of health, do you have any difficulty performing the following activities: 09/23/2018  Hearing? N  Vision? N  Difficulty concentrating or making decisions? N  Walking or climbing stairs? Y  Comment H/O hip  surgery  Dressing or bathing? N  Doing errands, shopping? N  Preparing Food and eating ? N  Using the Toilet? N  In the past six months, have you accidently leaked urine? N  Do you have problems with loss of bowel control? N  Managing your Medications? N  Managing your Finances? N  Housekeeping or managing your Housekeeping? N  Some recent data might be hidden    Patient Care Team: [name], MD as PCP - General (Family Medicine) Fleet Contras, MD as Consulting Physician (Nephrology) Laurence Spates, MD as Consulting Physician (Gastroenterology) Virgina Evener, [place] as Consulting Physician (Optometry) Elijio Miles, MD as Referring Physician (Nephrology) Orbie Hurst, MD as Referring Physician (Dermatology) Gretchen Short, DO as Consulting Physician (Ophthalmology)   Assessment:   This is a routine wellness examination for [place].  Exercise Activities and Dietary recommendations Current Exercise Habits: The patient does not participate in regular exercise at present, Exercise limited by: None identified   Diet (meal preparation, eat out, water intake, caffeinated beverages, dairy products, fruits and vegetables): Drinks diet coke, water, V8. Avoids sugary juices.   Eats 3 meals/day, some fruits/vegetables.  Avoids sweets.   Goals      Patient Stated   . Patient states (pt-stated)     Maintain current health by staying active.        Fall Risk Fall Risk  09/23/2018 09/17/2017 07/13/2017 03/03/2016 09/03/2014  Falls in the past year? Yes No No No No  Comment lost balance while admistering eye drops - Emmi Telephone Survey: data to providers prior to load - -  Number falls in past yr: 1 - - - -  Injury with Fall? No - - - -  Follow up Falls prevention discussed - - - -  Depression Screen PHQ 2/9 Scores 09/23/2018 09/17/2017 03/03/2016 09/03/2014  PHQ - 2 Score 1 2 2 1   PHQ- 9 Score 4 3 6  -    Cognitive Function MMSE - Mini Mental State Exam 09/23/2018  09/17/2017  Not completed: - Refused  Orientation to time 5 -  Orientation to Place 5 -  Registration 3 -  Attention/ Calculation 5 -  Recall 3 -  Language- name 2 objects 2 -  Language- repeat 1 -  Language- follow 3 step command 3 -  Language- read & follow direction 1 -  Write a sentence 1 -  Copy design 1 -  Total score 30 -        Immunization History  Administered Date(s) Administered  . Influenza, High Dose Seasonal PF 11/01/2016, 09/17/2017, 08/30/2018  . Influenza-Unspecified 10/08/2015  . Pneumococcal Conjugate-13 08/03/2016  . Pneumococcal Polysaccharide-23 09/17/2017  . Tdap 11/01/2016  . Zoster Recombinat (Shingrix) 10/30/2017, 02/06/2018    Screening Tests Health Maintenance  Topic Date Due  . HEMOGLOBIN A1C  02/28/2019  . URINE MICROALBUMIN  05/18/2019  . OPHTHALMOLOGY EXAM  08/23/2019  . FOOT EXAM  08/26/2019  . COLONOSCOPY  06/10/2024  . TETANUS/TDAP  11/01/2026  . INFLUENZA VACCINE  Completed  . Hepatitis C Screening  Completed  . PNA vac Low Risk Adult  Completed        Plan:    Continue doing brain stimulating activities (puzzles, reading, adult coloring books, staying active) to keep memory sharp.   I have personally reviewed and noted the following in the patient's chart:   . Medical and social history . Use of alcohol, tobacco or illicit drugs  . Current medications and supplements . Functional ability and status . Nutritional status . Physical activity . Advanced directives . List of other physicians . Hospitalizations, surgeries, and ER visits in previous 12 months . Vitals . Screenings to include cognitive, depression, and falls . Referrals and appointments  In addition, I have reviewed and discussed with patient certain preventive protocols, quality metrics, and best practice recommendations. A written personalized care plan for preventive services as well as general preventive health recommendations were provided to patient.       Gerilyn Nestle, RN  09/23/2018  PCP Notes: -Pt states he has continued Amlodipine 5mg /daily, never stopped. Advised to check medications when he returns home and call with update (taking or not).  -PHQ9=4 -F/U with PCP 11/27/18

## 2018-09-23 NOTE — Patient Instructions (Addendum)
Continue doing brain stimulating activities (puzzles, reading, adult coloring books, staying active) to keep memory sharp.   Health Maintenance, Male A healthy lifestyle and preventive care is important for your health and wellness. Ask your health care provider about what schedule of regular examinations is right for you. What should I know about weight and diet? Eat a Healthy Diet  Eat plenty of vegetables, fruits, whole grains, low-fat dairy products, and lean protein.  Do not eat a lot of foods high in solid fats, added sugars, or salt.  Maintain a Healthy Weight Regular exercise can help you achieve or maintain a healthy weight. You should:  Do at least 150 minutes of exercise each week. The exercise should increase your heart rate and make you sweat (moderate-intensity exercise).  Do strength-training exercises at least twice a week.  Watch Your Levels of Cholesterol and Blood Lipids  Have your blood tested for lipids and cholesterol every 5 years starting at 68 years of age. If you are at high risk for heart disease, you should start having your blood tested when you are 68 years old. You may need to have your cholesterol levels checked more often if: ? Your lipid or cholesterol levels are high. ? You are older than 68 years of age. ? You are at high risk for heart disease.  What should I know about cancer screening? Many types of cancers can be detected early and may often be prevented. Lung Cancer  You should be screened every year for lung cancer if: ? You are a current smoker who has smoked for at least 30 years. ? You are a former smoker who has quit within the past 15 years.  Talk to your health care provider about your screening options, when you should start screening, and how often you should be screened.  Colorectal Cancer  Routine colorectal cancer screening usually begins at 68 years of age and should be repeated every 5-10 years until you are 68 years old. You  may need to be screened more often if early forms of precancerous polyps or small growths are found. Your health care provider may recommend screening at an earlier age if you have risk factors for colon cancer.  Your health care provider may recommend using home test kits to check for hidden blood in the stool.  A small camera at the end of a tube can be used to examine your colon (sigmoidoscopy or colonoscopy). This checks for the earliest forms of colorectal cancer.  Prostate and Testicular Cancer  Depending on your age and overall health, your health care provider may do certain tests to screen for prostate and testicular cancer.  Talk to your health care provider about any symptoms or concerns you have about testicular or prostate cancer.  Skin Cancer  Check your skin from head to toe regularly.  Tell your health care provider about any new moles or changes in moles, especially if: ? There is a change in a mole's size, shape, or color. ? You have a mole that is larger than a pencil eraser.  Always use sunscreen. Apply sunscreen liberally and repeat throughout the day.  Protect yourself by wearing long sleeves, pants, a wide-brimmed hat, and sunglasses when outside.  What should I know about heart disease, diabetes, and high blood pressure?  If you are 18-39 years of age, have your blood pressure checked every 3-5 years. If you are 40 years of age or older, have your blood pressure checked every year. You   should have your blood pressure measured twice-once when you are at a hospital or clinic, and once when you are not at a hospital or clinic. Record the average of the two measurements. To check your blood pressure when you are not at a hospital or clinic, you can use: ? An automated blood pressure machine at a pharmacy. ? A home blood pressure monitor.  Talk to your health care provider about your target blood pressure.  If you are between 45-79 years old, ask your health care  provider if you should take aspirin to prevent heart disease.  Have regular diabetes screenings by checking your fasting blood sugar level. ? If you are at a normal weight and have a low risk for diabetes, have this test once every three years after the age of 45. ? If you are overweight and have a high risk for diabetes, consider being tested at a younger age or more often.  A one-time screening for abdominal aortic aneurysm (AAA) by ultrasound is recommended for men aged 65-75 years who are current or former smokers. What should I know about preventing infection? Hepatitis B If you have a higher risk for hepatitis B, you should be screened for this virus. Talk with your health care provider to find out if you are at risk for hepatitis B infection. Hepatitis C Blood testing is recommended for:  Everyone born from 1945 through 1965.  Anyone with known risk factors for hepatitis C.  Sexually Transmitted Diseases (STDs)  You should be screened each year for STDs including gonorrhea and chlamydia if: ? You are sexually active and are younger than 68 years of age. ? You are older than 68 years of age and your health care provider tells you that you are at risk for this type of infection. ? Your sexual activity has changed since you were last screened and you are at an increased risk for chlamydia or gonorrhea. Ask your health care provider if you are at risk.  Talk with your health care provider about whether you are at high risk of being infected with HIV. Your health care provider may recommend a prescription medicine to help prevent HIV infection.  What else can I do?  Schedule regular health, dental, and eye exams.  Stay current with your vaccines (immunizations).  Do not use any tobacco products, such as cigarettes, chewing tobacco, and e-cigarettes. If you need help quitting, ask your health care provider.  Limit alcohol intake to no more than 2 drinks per day. One drink equals 12  ounces of beer, 5 ounces of wine, or 1 ounces of hard liquor.  Do not use street drugs.  Do not share needles.  Ask your health care provider for help if you need support or information about quitting drugs.  Tell your health care provider if you often feel depressed.  Tell your health care provider if you have ever been abused or do not feel safe at home. This information is not intended to replace advice given to you by your health care provider. Make sure you discuss any questions you have with your health care provider. Document Released: 06/08/2008 Document Revised: 08/09/2016 Document Reviewed: 09/14/2015 Elsevier Interactive Patient Education  2018 Elsevier Inc.  

## 2018-09-26 NOTE — Progress Notes (Signed)
AWV reviewed and agree. Signed:  Phil Karthik Whittinghill, MD           09/26/2018  

## 2018-10-03 DIAGNOSIS — H401232 Low-tension glaucoma, bilateral, moderate stage: Secondary | ICD-10-CM | POA: Diagnosis not present

## 2018-10-09 DIAGNOSIS — C4442 Squamous cell carcinoma of skin of scalp and neck: Secondary | ICD-10-CM | POA: Diagnosis not present

## 2018-10-25 ENCOUNTER — Other Ambulatory Visit: Payer: Self-pay | Admitting: Family Medicine

## 2018-10-28 ENCOUNTER — Other Ambulatory Visit: Payer: Self-pay | Admitting: Family Medicine

## 2018-11-01 DIAGNOSIS — Z940 Kidney transplant status: Secondary | ICD-10-CM | POA: Diagnosis not present

## 2018-11-01 DIAGNOSIS — E785 Hyperlipidemia, unspecified: Secondary | ICD-10-CM | POA: Diagnosis not present

## 2018-11-01 DIAGNOSIS — M109 Gout, unspecified: Secondary | ICD-10-CM | POA: Diagnosis not present

## 2018-11-01 DIAGNOSIS — E139 Other specified diabetes mellitus without complications: Secondary | ICD-10-CM | POA: Diagnosis not present

## 2018-11-01 LAB — BASIC METABOLIC PANEL
BUN: 36 — AB (ref 4–21)
Creatinine: 1.9 — AB (ref 0.6–1.3)
GLUCOSE: 131
POTASSIUM: 5 (ref 3.4–5.3)
SODIUM: 140 (ref 137–147)

## 2018-11-02 LAB — CBC AND DIFFERENTIAL
HEMATOCRIT: 42 (ref 41–53)
Hemoglobin: 15.3 (ref 13.5–17.5)
NEUTROS ABS: 4
Platelets: 137 — AB (ref 150–399)
WBC: 6.6

## 2018-11-02 LAB — HEPATIC FUNCTION PANEL
ALT: 17 (ref 10–40)
AST: 20 (ref 14–40)
Alkaline Phosphatase: 118 (ref 25–125)
Bilirubin, Total: 0.5

## 2018-11-05 LAB — LIPID PANEL
CHOLESTEROL: 173 (ref 0–200)
HDL: 66 (ref 35–70)
LDL Cholesterol: 77
Triglycerides: 148 (ref 40–160)

## 2018-11-05 LAB — HEMOGLOBIN A1C: Hemoglobin A1C: 6.4 % — AB (ref 4.0–5.6)

## 2018-11-20 DIAGNOSIS — D631 Anemia in chronic kidney disease: Secondary | ICD-10-CM | POA: Diagnosis not present

## 2018-11-20 DIAGNOSIS — Z940 Kidney transplant status: Secondary | ICD-10-CM | POA: Diagnosis not present

## 2018-11-20 DIAGNOSIS — N2581 Secondary hyperparathyroidism of renal origin: Secondary | ICD-10-CM | POA: Diagnosis not present

## 2018-11-20 DIAGNOSIS — I129 Hypertensive chronic kidney disease with stage 1 through stage 4 chronic kidney disease, or unspecified chronic kidney disease: Secondary | ICD-10-CM | POA: Diagnosis not present

## 2018-11-20 DIAGNOSIS — N183 Chronic kidney disease, stage 3 (moderate): Secondary | ICD-10-CM | POA: Diagnosis not present

## 2018-11-26 ENCOUNTER — Encounter: Payer: Self-pay | Admitting: *Deleted

## 2018-11-26 DIAGNOSIS — F329 Major depressive disorder, single episode, unspecified: Secondary | ICD-10-CM | POA: Insufficient documentation

## 2018-11-26 DIAGNOSIS — F419 Anxiety disorder, unspecified: Secondary | ICD-10-CM

## 2018-11-27 ENCOUNTER — Encounter: Payer: Self-pay | Admitting: Family Medicine

## 2018-11-27 ENCOUNTER — Ambulatory Visit (INDEPENDENT_AMBULATORY_CARE_PROVIDER_SITE_OTHER): Payer: Medicare Other | Admitting: Family Medicine

## 2018-11-27 ENCOUNTER — Encounter: Payer: Self-pay | Admitting: *Deleted

## 2018-11-27 VITALS — BP 123/84 | HR 75 | Temp 98.3°F | Resp 16 | Ht 72.0 in | Wt 249.0 lb

## 2018-11-27 DIAGNOSIS — E118 Type 2 diabetes mellitus with unspecified complications: Secondary | ICD-10-CM | POA: Diagnosis not present

## 2018-11-27 DIAGNOSIS — E7800 Pure hypercholesterolemia, unspecified: Secondary | ICD-10-CM | POA: Diagnosis not present

## 2018-11-27 DIAGNOSIS — N2889 Other specified disorders of kidney and ureter: Secondary | ICD-10-CM

## 2018-11-27 DIAGNOSIS — N183 Chronic kidney disease, stage 3 unspecified: Secondary | ICD-10-CM

## 2018-11-27 DIAGNOSIS — I10 Essential (primary) hypertension: Secondary | ICD-10-CM | POA: Diagnosis not present

## 2018-11-27 DIAGNOSIS — F411 Generalized anxiety disorder: Secondary | ICD-10-CM | POA: Diagnosis not present

## 2018-11-27 LAB — POCT GLYCOSYLATED HEMOGLOBIN (HGB A1C): Hemoglobin A1C: 6 % — AB (ref 4.0–5.6)

## 2018-11-27 NOTE — Progress Notes (Signed)
OFFICE VISIT  11/27/2018   CC:  Chief Complaint  Patient presents with  . Follow-up    RCI, pt is fasting.    HPI:    Patient is a 67 y.o. Caucasian male who presents for 3 mo f/u DM 2, HTN, HLD, CRI III with hx of renal transplant.   Doing well.  No complaints. Saw nephrologist 11/01/18: labs very good, reviewed today and will be scanned into chart.  Cr stable at 1.8. HbA1c 6.4%.  HTN: home bp's 120s/70s  DM 2: fastings 120 avg.  HLD: tolerating statin.  Anxiety: doing well on his antidepressant and prn alprazolam (avg a few per week).  No adverse effects from meds. Control substance contract discussed/reviewed today.  ROS: no CP, no SOB, no wheezing, no cough, no dizziness, no HAs, no rashes, no melena/hematochezia.  No polyuria or polydipsia.  No myalgias or arthralgias.   Past Medical History:  Diagnosis Date  . Anxiety and depression    crowds, closed in spaces, used to sleep, prn  . Chronic renal insufficiency, stage III (moderate) (HCC)    polycystic kidney dz. (followed by Dr. Patel/Nephrology)--kidney transplant 2004.  Cr rising a little: up to 1.85 feb 2019, 1.84.  Cr 1.68, GFR 41  on 07/08/18.  . DDD (degenerative disc disease), lumbar   . Deep vein thrombosis (DVT) of left upper extremity (HCC)    due to PICC line  . Diabetes mellitus with complication (HCC)    Mild non-proliferative diab retinopathy OS.  Hba1c at nephrologist's 02/18/18 was 5.8%.  . Diverticulitis   . Gastritis 01/2017   likely due to alcohol  . GERD (gastroesophageal reflux disease)   . Glaucoma   . Gout   . H/O nonmelanoma skin cancer    SCC (chest, face, head)--s/p Moh's 2009  . H/O polycystic kidney disease   . History of depression   . History of kidney transplant    2004   . History of retinal detachment    Dr. Baird Cancer  . Hx of adenomatous colonic polyps 02/2012; 05/2014   tubular adenomas (Dr. Oletta Lamas)  . Hyperlipemia, mixed   . Hypertension   . Osteoarthritis    s/p R THR     Past Surgical History:  Procedure Laterality Date  . COLONOSCOPY  06/10/14   Tubular adenoma x 4: recall 3 yrs (Dr. Alferd [facility]a [facility])  . COLONOSCOPY  03/19/2012   tubular adenoma x 1; diverticulosis, int hem.Procedure: COLONOSCOPY;  Surgeon: Winfield Cunas., MD;  Location: [facility] ENDOSCOPY;  Service: Endoscopy;  Laterality: N/A;  . HOT HEMOSTASIS  03/19/2012   Procedure: HOT HEMOSTASIS (ARGON PLASMA COAGULATION/BICAP);  Surgeon: Winfield Cunas., MD;  Location: [facility] ENDOSCOPY;  Service: Endoscopy;  Laterality: N/A;  . KIDNEY TRANSPLANT  2004  . TOTAL HIP ARTHROPLASTY  12/10/2012   Procedure: TOTAL HIP ARTHROPLASTY ANTERIOR APPROACH;  Surgeon: Mauri Pole, MD;  Location: [facility] ORS;  Service: Orthopedics;  Laterality: Right;    Outpatient Medications Prior to Visit  Medication Sig Dispense Refill  . ACCU-CHEK SMARTVIEW test strip CHECK BLOOD SUGAR DAILY 100 each 0  . allopurinol (ZYLOPRIM) 100 MG tablet Take 200 mg by mouth at bedtime.     . ALPRAZolam (XANAX) 0.25 MG tablet TAKE 1 TABLET BY MOUTH EVERY DAY AS NEEDED 30 tablet 5  . amLODipine (NORVASC) 5 MG tablet Take 5 mg by mouth daily.    . carboxymethylcellulose (REFRESH PLUS) 0.5 % SOLN 1 drop 3 (three) times daily as needed.    Marland Kitchen  FLUoxetine (PROZAC) 40 MG capsule Take 40 mg by mouth daily. Patient takes in am    . glipiZIDE (GLUCOTROL) 5 MG tablet Take 5 mg by mouth. 2 tablets in the morning and 1 tablet in the evening    . MAGNESIUM-OXIDE 400 (241.3 Mg) MG tablet TK 1 T PO TID  6  . metoprolol tartrate (LOPRESSOR) 50 MG tablet Take 1 tablet (50 mg total) by mouth 2 (two) times daily. 60 tablet 6  . mycophenolate (CELLCEPT) 250 MG capsule Take 250 mg by mouth 2 (two) times daily.     . pantoprazole (PROTONIX) 40 MG tablet Take 40 mg by mouth 2 (two) times daily.     . pioglitazone (ACTOS) 45 MG tablet TAKE 1 TABLET BY MOUTH DAILY 90 tablet 0  . predniSONE (DELTASONE) 5 MG tablet Take 5 mg by mouth daily.    . rosuvastatin  (CRESTOR) 20 MG tablet TAKE 1 TABLET BY MOUTH DAILY 90 tablet 0  . tacrolimus (PROGRAF) 0.5 MG capsule Take 1 capsule by mouth 2 (two) times daily.  6  . tacrolimus (PROGRAF) 1 MG capsule Take 1 mg by mouth 2 (two) times daily.     Marland Kitchen ZIOPTAN 0.0015 % SOLN Place 1 drop into both eyes at bedtime.  3   No facility-administered medications prior to visit.     Allergies  Allergen Reactions  . Lipitor [Atorvastatin] Other (See Comments)    Headaches  . Zocor [Simvastatin] Other (See Comments)    Myalgia  . Penicillins Rash    ROS As per HPI  PE: Blood pressure 123/84, pulse 75, temperature 98.3 F (36.8 C), temperature source Oral, resp. rate 16, height 6' (1.829 m), weight 249 lb (112.9 kg), SpO2 96 %. Gen: Alert, well appearing.  Patient is oriented to person, place, time, and situation. AFFECT: pleasant, lucid thought and speech. CV: RRR, no m/r/g.   LUNGS: CTA bilat, nonlabored resps, good aeration in all lung fields. EXT: no clubbing or cyanosis.  no edema.    LABS:  Lab Results  Component Value Date   TSH 2.36 05/03/2016   Lab Results  Component Value Date   WBC 6.3 07/08/2018   HGB 14.9 07/08/2018   HCT 46 07/08/2018   MCV 98.6 02/09/2017   PLT 120 (A) 07/08/2018   Lab Results  Component Value Date   CREATININE 1.7 (A) 07/08/2018   BUN 37 (A) 07/08/2018   NA 139 07/08/2018   K 4.5 07/08/2018   CL 105 05/17/2018   CO2 23 05/17/2018   Lab Results  Component Value Date   ALT 19 07/08/2018   AST 17 07/08/2018   ALKPHOS 119 07/08/2018   BILITOT 1.0 02/09/2017   Lab Results  Component Value Date   CHOL 178 02/01/2017   Lab Results  Component Value Date   HDL 58.50 02/01/2017   No results found for: [facility] Lab Results  Component Value Date   TRIG 277.0 (H) 02/01/2017   Lab Results  Component Value Date   CHOLHDL 3 02/01/2017   Lab Results  Component Value Date   HGBA1C 6.0 08/30/2018   HbA1c today 6.0%  IMPRESSION AND PLAN:  1) DM 2: very  good control. A1c 6.4% on 11/01/18 at nephrologist.   6% today.  2) HTN: The current medical regimen is effective;  continue present plan and medications. Recent lytes normal/cr stable 11/01/18 at nephrol.  3) HLD: excellent lipid panel 11/01/18 at nephrol. Continue statin.  4) CRI stage III: baseline Cr 1.8--->stable  at nephrol 11/01/18.  5) GAD: doing well on fluoxetine 40mg  qd and alpraz 0.25mg  qd prn. Controlled substance contract reviewed with patient today.  Patient signed this and it will be placed in the chart.    An After Visit Summary was printed and given to the patient.   FOLLOW UP: Return in about 6 months (around 05/29/2019) for routine chronic illness f/u.  Signed:  Crissie Sickles, MD           11/27/2018

## 2018-12-31 ENCOUNTER — Encounter: Payer: Self-pay | Admitting: Family Medicine

## 2019-01-04 ENCOUNTER — Other Ambulatory Visit: Payer: Self-pay | Admitting: Family Medicine

## 2019-01-23 ENCOUNTER — Other Ambulatory Visit: Payer: Self-pay | Admitting: Family Medicine

## 2019-01-29 DIAGNOSIS — Z940 Kidney transplant status: Secondary | ICD-10-CM | POA: Diagnosis not present

## 2019-02-14 ENCOUNTER — Other Ambulatory Visit: Payer: Self-pay | Admitting: Family Medicine

## 2019-03-04 DIAGNOSIS — E139 Other specified diabetes mellitus without complications: Secondary | ICD-10-CM | POA: Diagnosis not present

## 2019-03-04 DIAGNOSIS — M109 Gout, unspecified: Secondary | ICD-10-CM | POA: Diagnosis not present

## 2019-03-04 DIAGNOSIS — E785 Hyperlipidemia, unspecified: Secondary | ICD-10-CM | POA: Diagnosis not present

## 2019-03-04 DIAGNOSIS — Z940 Kidney transplant status: Secondary | ICD-10-CM | POA: Diagnosis not present

## 2019-04-14 ENCOUNTER — Other Ambulatory Visit: Payer: Self-pay | Admitting: Family Medicine

## 2019-05-30 ENCOUNTER — Other Ambulatory Visit: Payer: Self-pay | Admitting: Family Medicine

## 2019-06-02 ENCOUNTER — Ambulatory Visit: Payer: Medicare Other | Admitting: Family Medicine

## 2019-06-12 ENCOUNTER — Encounter: Payer: Self-pay | Admitting: Family Medicine

## 2019-06-12 ENCOUNTER — Ambulatory Visit (INDEPENDENT_AMBULATORY_CARE_PROVIDER_SITE_OTHER): Payer: Medicare Other | Admitting: Family Medicine

## 2019-06-12 ENCOUNTER — Other Ambulatory Visit: Payer: Self-pay

## 2019-06-12 VITALS — BP 145/85 | HR 70 | Ht 72.0 in | Wt 247.0 lb

## 2019-06-12 DIAGNOSIS — N183 Chronic kidney disease, stage 3 unspecified: Secondary | ICD-10-CM

## 2019-06-12 DIAGNOSIS — Z940 Kidney transplant status: Secondary | ICD-10-CM

## 2019-06-12 DIAGNOSIS — E119 Type 2 diabetes mellitus without complications: Secondary | ICD-10-CM

## 2019-06-12 DIAGNOSIS — I10 Essential (primary) hypertension: Secondary | ICD-10-CM

## 2019-06-12 DIAGNOSIS — E7800 Pure hypercholesterolemia, unspecified: Secondary | ICD-10-CM

## 2019-06-12 NOTE — Progress Notes (Signed)
Virtual Visit via Video Note  I connected with pt on 06/12/19 at 11:30 AM EDT by a video enabled telemedicine application and verified that I am speaking with the correct person using two identifiers.  Location patient: home Location provider:work or home office Persons participating in the virtual visit: patient, provider  I discussed the limitations of evaluation and management by telemedicine and the availability of in person appointments. The patient expressed understanding and agreed to proceed.  Telemedicine visit is a necessity given the COVID-19 restrictions in place at the current time.  HPI: 68 y/o WM being seen today for 6 mo f/u DM 2, HTN, HLD, CRI III with hx of renal transplant. [place]  DM: checks regularly but not daily-->120s avg--fasting numbers only.  HTN: home bp up today but pt says this is not usual.  Bp consistently 125/80 at home usually.  CRI: Most recent neph f/u was 02/2019.  Pt is due for labs.  HLD: tolerating statin.  Additionally, he takes alprazolam on a prn basis for chronic anxiety. Most recent rx filled 02/14/19 for #30 tabs. CSC in chart.  ROS: See pertinent positives and negatives per HPI.  Past Medical History:  Diagnosis Date  . Anxiety and depression    crowds, closed in spaces, used to sleep, prn  . Chronic renal insufficiency, stage III (moderate) (HCC)    polycystic kidney dz. (followed by Dr. Patel/Nephrology)--kidney transplant 2004.  Cr rising a little: up to 1.85 feb 2019, 1.84.  Cr 1.68, GFR 41  on 07/08/18.No change 10/2018.  . DDD (degenerative disc disease), lumbar   . Deep vein thrombosis (DVT) of left upper extremity (HCC)    due to PICC line  . Diabetes mellitus with complication (HCC)    Mild non-proliferative diab retinopathy OS.  Hba1c at nephrologist's 02/18/18 was 5.8%.  . Diverticulitis   . Gastritis 01/2017   likely due to alcohol  . GERD (gastroesophageal reflux disease)   . Glaucoma    . Gout   . H/O nonmelanoma skin cancer    SCC (chest, face, head)--s/p Moh's 2009  . H/O polycystic kidney disease   . History of depression   . History of kidney transplant    2004   . History of retinal detachment    Dr. Baird Cancer  . Hx of adenomatous colonic polyps 02/2012; 05/2014   tubular adenomas (Dr. Oletta Lamas)  . Hyperlipemia, mixed   . Hypertension   . Osteoarthritis    s/p R THR    Past Surgical History:  Procedure Laterality Date  . COLONOSCOPY  06/10/14   Tubular adenoma x 4: recall 3 yrs (Dr. Alferd [facility]a [facility])  . COLONOSCOPY  03/19/2012   tubular adenoma x 1; diverticulosis, int hem.Procedure: COLONOSCOPY;  Surgeon: Winfield Cunas., MD;  Location: [facility] ENDOSCOPY;  Service: Endoscopy;  Laterality: N/A;  . HOT HEMOSTASIS  03/19/2012   Procedure: HOT HEMOSTASIS (ARGON PLASMA COAGULATION/BICAP);  Surgeon: Winfield Cunas., MD;  Location: [facility] ENDOSCOPY;  Service: Endoscopy;  Laterality: N/A;  . KIDNEY TRANSPLANT  2004  . TOTAL HIP ARTHROPLASTY  12/10/2012   Procedure: TOTAL HIP ARTHROPLASTY ANTERIOR APPROACH;  Surgeon: Mauri Pole, MD;  Location: [facility] ORS;  Service: Orthopedics;  Laterality: Right;    Family History  Problem Relation Age of Onset  . Diabetes Mother   . Heart attack Mother   . Hyperlipidemia Father   . Kidney disease Father   . Hypertension Father   . Kidney disease Brother   .  Heart disease Brother   . Diabetes Maternal Grandmother   . Kidney disease Paternal Grandfather   . Polycystic kidney disease Unknown   . Anesthesia problems Neg Hx   . Hypotension Neg Hx   . Malignant hyperthermia Neg Hx   . Pseudochol deficiency Neg Hx     SOCIAL HX:  Social History   Socioeconomic History  . Marital status: Married    Spouse name: Not on file  . Number of children: Not on file  . Years of education: Not on file  . Highest education level: Not on file  Occupational History  . Not on file  Social Needs  . Financial resource strain: Not on  file  . Food insecurity    Worry: Not on file    Inability: Not on file  . Transportation needs    Medical: Not on file    Non-medical: Not on file  Tobacco Use  . Smoking status: Former Smoker    Packs/day: 2.00    Years: 30.00    Pack years: 60.00    Types: Cigarettes    Quit date: 12/26/1983    Years since quitting: 35.4  . Smokeless tobacco: Never Used  Substance and Sexual Activity  . Alcohol use: Yes    Alcohol/week: 28.0 standard drinks    Types: 28 Glasses of wine per week    Comment: every day 3 glasses of wine  . Drug use: No  . Sexual activity: Not on file  Lifestyle  . Physical activity    Days per week: Not on file    Minutes per session: Not on file  . Stress: Not on file  Relationships  . Social Herbalist on phone: Not on file    Gets together: Not on file    Attends religious service: Not on file    Active member of club or organization: Not on file    Attends meetings of clubs or organizations: Not on file    Relationship status: Not on file  Other Topics Concern  . Not on file  Social History Narrative   Married, one son.   Occupation: retired Merchant [employer] officer.   Orig from [place].   Former smoker x 20 yrs, quit in 41s.   Alcohol: 3 glasses of wine nightly, occ beer, rare hard liquor.      Current Outpatient Medications:  .  ACCU-CHEK SMARTVIEW test strip, USE TO TEST BLOOD SUGAR EVERY DAY, Disp: 100 each, Rfl: 0 .  allopurinol (ZYLOPRIM) 100 MG tablet, Take 200 mg by mouth at bedtime. , Disp: , Rfl:  .  ALPRAZolam (XANAX) 0.25 MG tablet, TAKE 1 TABLET BY MOUTH EVERY DAY AS NEEDED, Disp: 30 tablet, Rfl: 5 .  carboxymethylcellulose (REFRESH PLUS) 0.5 % SOLN, 1 drop 3 (three) times daily as needed., Disp: , Rfl:  .  FLUoxetine (PROZAC) 40 MG capsule, Take 40 mg by mouth daily. Patient takes in am, Disp: , Rfl:  .  glipiZIDE (GLUCOTROL) 5 MG tablet, Take 5 mg by mouth. 2 tablets in the morning and 1 tablet in the evening, Disp: , Rfl:   .  MAGNESIUM-OXIDE 400 (241.3 Mg) MG tablet, TK 1 T PO TID, Disp: , Rfl: 6 .  metoprolol tartrate (LOPRESSOR) 50 MG tablet, Take 1 tablet (50 mg total) by mouth 2 (two) times daily., Disp: 60 tablet, Rfl: 6 .  mycophenolate (CELLCEPT) 250 MG capsule, Take 250 mg by mouth 2 (two) times daily. , Disp: , Rfl:  .  pantoprazole (PROTONIX) 40 MG tablet, Take 40 mg by mouth 2 (two) times daily. , Disp: , Rfl:  .  pioglitazone (ACTOS) 45 MG tablet, TAKE 1 TABLET BY MOUTH DAILY, Disp: 90 tablet, Rfl: 1 .  predniSONE (DELTASONE) 5 MG tablet, Take 5 mg by mouth daily., Disp: , Rfl:  .  rosuvastatin (CRESTOR) 20 MG tablet, TAKE 1 TABLET BY MOUTH EVERY DAY, Disp: 90 tablet, Rfl: 0 .  tacrolimus (PROGRAF) 0.5 MG capsule, Take 1 capsule by mouth 2 (two) times daily., Disp: , Rfl: 6 .  tacrolimus (PROGRAF) 1 MG capsule, Take 1 mg by mouth 2 (two) times daily. , Disp: , Rfl:  .  ZIOPTAN 0.0015 % SOLN, Place 1 drop into both eyes at bedtime., Disp: , Rfl: 3 .  amLODipine (NORVASC) 5 MG tablet, Take 5 mg by mouth daily., Disp: , Rfl:   EXAM:  VITALS per patient if applicable: BP (!) 716/96 (BP Location: Left Arm, Patient Position: Sitting, Cuff Size: Large)   Pulse 70   Ht 6' (1.829 m)   Wt 247 lb (112 kg)   BMI 33.50 kg/m    GENERAL: alert, oriented, appears well and in no acute distress  HEENT: atraumatic, conjunttiva clear, no obvious abnormalities on inspection of external nose and ears  NECK: normal movements of the head and neck  LUNGS: on inspection no signs of respiratory distress, breathing rate appears normal, no obvious gross SOB, gasping or wheezing  CV: no obvious cyanosis  MS: moves all visible extremities without noticeable abnormality  PSYCH/NEURO: pleasant and cooperative, no obvious depression or anxiety, speech and thought processing grossly intact  LABS; none today    Chemistry      Component Value Date/Time   NA 140 11/01/2018   K 5.0 11/01/2018   CL 105 05/17/2018 1209    CO2 23 05/17/2018 1209   BUN 36 (A) 11/01/2018   CREATININE 1.9 (A) 11/01/2018   CREATININE 1.57 (H) 05/17/2018 1209   CREATININE 1.85 02/18/2018   GLU 131 11/01/2018      Component Value Date/Time   CALCIUM 9.1 05/17/2018 1209   ALKPHOS 118 11/02/2018   AST 20 11/02/2018   ALT 17 11/02/2018   BILITOT 1.0 02/09/2017 1542     Lab Results  Component Value Date   HGBA1C 6.0 (A) 11/27/2018   Lab Results  Component Value Date   CHOL 173 11/05/2018   HDL 66 11/05/2018   LDLCALC 77 11/05/2018   LDLDIRECT 79.0 02/01/2017   TRIG 148 11/05/2018   CHOLHDL 3 02/01/2017    ASSESSMENT AND PLAN:  Discussed the following assessment and plan:  1) DM 2, good control. HbA1c future.   Lytes/cr future.  2) HLD: tolerating statin. FLP and hepatic panel future.  3) HTN: The current medical regimen is effective;  continue present plan and medications. Lytes/cr-future.  4) CRI III, with hx of renal transplant: he is apprehensive about multiple follow ups and lab checks, so he agrees to come to our lab here but asks if I'll draw all the usual labs that his "regular" nephrologist (Dr. Posey Pronto) and his transplant MD would want.  I said I would do this and forward them the results.  Pt to bring list of the usual labs that their offices would want.   I discussed the assessment and treatment plan with the patient. The patient was provided an opportunity to ask questions and all were answered. The patient agreed with the plan and demonstrated an understanding of the instructions.  The patient was advised to call back or seek an in-person evaluation if the symptoms worsen or if the condition fails to improve as anticipated.  F/u: 6 mo RCI  Signed:  Crissie Sickles, MD           06/12/2019

## 2019-06-16 ENCOUNTER — Other Ambulatory Visit: Payer: Self-pay | Admitting: Family Medicine

## 2019-06-16 ENCOUNTER — Ambulatory Visit: Payer: Medicare Other

## 2019-06-16 DIAGNOSIS — E119 Type 2 diabetes mellitus without complications: Secondary | ICD-10-CM

## 2019-06-16 DIAGNOSIS — Z940 Kidney transplant status: Secondary | ICD-10-CM

## 2019-06-16 DIAGNOSIS — N189 Chronic kidney disease, unspecified: Secondary | ICD-10-CM

## 2019-06-16 DIAGNOSIS — N2889 Other specified disorders of kidney and ureter: Secondary | ICD-10-CM

## 2019-06-16 DIAGNOSIS — N183 Chronic kidney disease, stage 3 unspecified: Secondary | ICD-10-CM

## 2019-06-16 DIAGNOSIS — E7800 Pure hypercholesterolemia, unspecified: Secondary | ICD-10-CM

## 2019-06-16 DIAGNOSIS — D631 Anemia in chronic kidney disease: Secondary | ICD-10-CM

## 2019-06-16 NOTE — Progress Notes (Signed)
Per last OV note,  A1C, Lipid, and Hepatic panel lab test ordered.  Please advise if anything else needs to be ordered.  Thanks.

## 2019-06-16 NOTE — Progress Notes (Signed)
Can a CBC (no diff) and iron panel be added?  Dx is chronic renal insuff, stage III and history of renal transplant.-thx

## 2019-06-16 NOTE — Progress Notes (Signed)
Labs added.

## 2019-06-23 ENCOUNTER — Ambulatory Visit (INDEPENDENT_AMBULATORY_CARE_PROVIDER_SITE_OTHER): Payer: Medicare Other | Admitting: Family Medicine

## 2019-06-23 ENCOUNTER — Other Ambulatory Visit: Payer: Self-pay | Admitting: Family Medicine

## 2019-06-23 ENCOUNTER — Other Ambulatory Visit: Payer: Self-pay

## 2019-06-23 DIAGNOSIS — N183 Chronic kidney disease, stage 3 unspecified: Secondary | ICD-10-CM

## 2019-06-23 DIAGNOSIS — E119 Type 2 diabetes mellitus without complications: Secondary | ICD-10-CM

## 2019-06-23 DIAGNOSIS — E7800 Pure hypercholesterolemia, unspecified: Secondary | ICD-10-CM | POA: Diagnosis not present

## 2019-06-23 DIAGNOSIS — Z940 Kidney transplant status: Secondary | ICD-10-CM | POA: Diagnosis not present

## 2019-06-23 DIAGNOSIS — N189 Chronic kidney disease, unspecified: Secondary | ICD-10-CM | POA: Diagnosis not present

## 2019-06-23 DIAGNOSIS — D631 Anemia in chronic kidney disease: Secondary | ICD-10-CM | POA: Diagnosis not present

## 2019-06-23 LAB — MAGNESIUM: Magnesium: 1.7 mg/dL (ref 1.5–2.5)

## 2019-06-23 LAB — HEPATIC FUNCTION PANEL
ALT: 15 U/L (ref 0–53)
AST: 14 U/L (ref 0–37)
Albumin: 4 g/dL (ref 3.5–5.2)
Alkaline Phosphatase: 97 U/L (ref 39–117)
Bilirubin, Direct: 0.2 mg/dL (ref 0.0–0.3)
Total Bilirubin: 0.8 mg/dL (ref 0.2–1.2)
Total Protein: 6.3 g/dL (ref 6.0–8.3)

## 2019-06-23 LAB — LIPID PANEL
Cholesterol: 180 mg/dL (ref 0–200)
HDL: 55.5 mg/dL (ref >39.00)
NonHDL: 124.92
Total CHOL/HDL Ratio: 3
Triglycerides: 246 mg/dL — ABNORMAL HIGH (ref 0.0–149.0)
VLDL: 49.2 mg/dL — ABNORMAL HIGH (ref 0.0–40.0)

## 2019-06-23 LAB — URINALYSIS, ROUTINE W REFLEX MICROSCOPIC
Bilirubin Urine: NEGATIVE
Hgb urine dipstick: NEGATIVE
Ketones, ur: NEGATIVE
Leukocytes,Ua: NEGATIVE
Nitrite: NEGATIVE
RBC / HPF: NONE SEEN (ref 0–?)
Specific Gravity, Urine: 1.015 (ref 1.000–1.030)
Total Protein, Urine: NEGATIVE
Urine Glucose: NEGATIVE
Urobilinogen, UA: 0.2 (ref 0.0–1.0)
WBC, UA: NONE SEEN (ref 0–?)
pH: 6 (ref 5.0–8.0)

## 2019-06-23 LAB — CBC
HCT: 42.4 % (ref 39.0–52.0)
Hemoglobin: 14 g/dL (ref 13.0–17.0)
MCHC: 33.1 g/dL (ref 30.0–36.0)
MCV: 100 fl (ref 78.0–100.0)
Platelets: 120 10*3/uL — ABNORMAL LOW (ref 150.0–400.0)
RBC: 4.24 Mil/uL (ref 4.22–5.81)
RDW: 15.1 % (ref 11.5–15.5)
WBC: 5.7 10*3/uL (ref 4.0–10.5)

## 2019-06-23 LAB — HEMOGLOBIN A1C: Hgb A1c MFr Bld: 6.1 % (ref 4.6–6.5)

## 2019-06-23 LAB — LDL CHOLESTEROL, DIRECT: Direct LDL: 91 mg/dL

## 2019-06-24 LAB — IRON, TOTAL/TOTAL IRON BINDING CAP
%SAT: 31 % (calc) (ref 20–48)
Iron: 91 ug/dL (ref 50–180)
TIBC: 293 mcg/dL (calc) (ref 250–425)

## 2019-06-24 LAB — TACROLIMUS LEVEL: Tacrolimus (FK506), Blood: 6.9 ng/mL

## 2019-06-24 LAB — MICROALBUMIN / CREATININE URINE RATIO
Creatinine,U: 85.3 mg/dL
Microalb Creat Ratio: 4.2 mg/g (ref 0.0–30.0)
Microalb, Ur: 3.6 mg/dL — ABNORMAL HIGH (ref 0.0–1.9)

## 2019-06-27 LAB — EPSTEIN-BARR VIRUS PCR: Epstein-Barr Virus Real Time: NEGATIVE

## 2019-06-28 LAB — CMV DNA, QUANTITATIVE, PCR: CMV DNA Quant: NEGATIVE IU/mL

## 2019-06-28 LAB — BK QUANT PCR (PLASMA/SERUM): BK Quantitaion PCR: NEGATIVE copies/mL

## 2019-06-29 ENCOUNTER — Other Ambulatory Visit: Payer: Self-pay | Admitting: Family Medicine

## 2019-06-30 ENCOUNTER — Ambulatory Visit (INDEPENDENT_AMBULATORY_CARE_PROVIDER_SITE_OTHER): Payer: Medicare Other

## 2019-06-30 ENCOUNTER — Other Ambulatory Visit: Payer: Self-pay

## 2019-06-30 DIAGNOSIS — N183 Chronic kidney disease, stage 3 unspecified: Secondary | ICD-10-CM

## 2019-06-30 DIAGNOSIS — Z940 Kidney transplant status: Secondary | ICD-10-CM | POA: Diagnosis not present

## 2019-06-30 NOTE — Telephone Encounter (Signed)
RF request for alprazolam. Last OV 06/12/19 No upcoming OV Last RX 09/16/2018 # 30 x 5 Rfs.  Please advise.

## 2019-07-01 LAB — BASIC METABOLIC PANEL
BUN: 39 mg/dL — ABNORMAL HIGH (ref 6–23)
CO2: 22 mEq/L (ref 19–32)
Calcium: 8.8 mg/dL (ref 8.4–10.5)
Chloride: 103 mEq/L (ref 96–112)
Creatinine, Ser: 1.86 mg/dL — ABNORMAL HIGH (ref 0.40–1.50)
GFR: 36.2 mL/min — ABNORMAL LOW (ref >60.00)
Glucose, Bld: 166 mg/dL — ABNORMAL HIGH (ref 70–99)
Potassium: 4.5 mEq/L (ref 3.5–5.1)
Sodium: 136 mEq/L (ref 135–145)

## 2019-07-02 ENCOUNTER — Encounter: Payer: Self-pay | Admitting: Family Medicine

## 2019-07-02 DIAGNOSIS — D692 Other nonthrombocytopenic purpura: Secondary | ICD-10-CM | POA: Diagnosis not present

## 2019-07-02 DIAGNOSIS — C44321 Squamous cell carcinoma of skin of nose: Secondary | ICD-10-CM | POA: Diagnosis not present

## 2019-07-02 DIAGNOSIS — D485 Neoplasm of uncertain behavior of skin: Secondary | ICD-10-CM | POA: Diagnosis not present

## 2019-07-02 DIAGNOSIS — Z85828 Personal history of other malignant neoplasm of skin: Secondary | ICD-10-CM | POA: Diagnosis not present

## 2019-07-02 DIAGNOSIS — L821 Other seborrheic keratosis: Secondary | ICD-10-CM | POA: Diagnosis not present

## 2019-07-02 DIAGNOSIS — L570 Actinic keratosis: Secondary | ICD-10-CM | POA: Diagnosis not present

## 2019-07-23 ENCOUNTER — Other Ambulatory Visit: Payer: Self-pay | Admitting: Family Medicine

## 2019-07-23 NOTE — Telephone Encounter (Signed)
RF request for crestor LOV: 06/12/19 Next ov: 12/08/19 Last written: 04/14/19

## 2019-09-03 DIAGNOSIS — C44321 Squamous cell carcinoma of skin of nose: Secondary | ICD-10-CM | POA: Diagnosis not present

## 2019-09-03 HISTORY — PX: NOSE SURGERY: SHX723

## 2019-10-01 ENCOUNTER — Ambulatory Visit (INDEPENDENT_AMBULATORY_CARE_PROVIDER_SITE_OTHER): Payer: Medicare Other

## 2019-10-01 ENCOUNTER — Other Ambulatory Visit: Payer: Self-pay

## 2019-10-01 DIAGNOSIS — Z23 Encounter for immunization: Secondary | ICD-10-CM | POA: Diagnosis not present

## 2019-11-04 DIAGNOSIS — E785 Hyperlipidemia, unspecified: Secondary | ICD-10-CM | POA: Diagnosis not present

## 2019-11-04 DIAGNOSIS — E139 Other specified diabetes mellitus without complications: Secondary | ICD-10-CM | POA: Diagnosis not present

## 2019-11-04 DIAGNOSIS — M109 Gout, unspecified: Secondary | ICD-10-CM | POA: Diagnosis not present

## 2019-11-04 DIAGNOSIS — Z940 Kidney transplant status: Secondary | ICD-10-CM | POA: Diagnosis not present

## 2019-11-04 LAB — BASIC METABOLIC PANEL
BUN: 43 — AB (ref 4–21)
CO2: 22 (ref 13–22)
Chloride: 106 (ref 99–108)
Creatinine: 1.9 — AB (ref 0.6–1.3)
Glucose: 153
Potassium: 4.6 (ref 3.4–5.3)
Sodium: 138 (ref 137–147)

## 2019-11-04 LAB — COMPREHENSIVE METABOLIC PANEL
Albumin: 4 (ref 3.5–5.0)
Calcium: 9.5 (ref 8.7–10.7)
GFR calc non Af Amer: 35

## 2019-11-04 LAB — HEPATIC FUNCTION PANEL
ALT: 15 (ref 10–40)
AST: 15 (ref 14–40)
Alkaline Phosphatase: 141 — AB (ref 25–125)
Bilirubin, Total: 0.5

## 2019-11-04 LAB — CBC AND DIFFERENTIAL
HCT: 41 (ref 41–53)
Hemoglobin: 13.7 (ref 13.5–17.5)

## 2019-11-05 DIAGNOSIS — Z940 Kidney transplant status: Secondary | ICD-10-CM | POA: Diagnosis not present

## 2019-11-05 DIAGNOSIS — I129 Hypertensive chronic kidney disease with stage 1 through stage 4 chronic kidney disease, or unspecified chronic kidney disease: Secondary | ICD-10-CM | POA: Diagnosis not present

## 2019-11-05 DIAGNOSIS — N183 Chronic kidney disease, stage 3 unspecified: Secondary | ICD-10-CM | POA: Diagnosis not present

## 2019-11-05 DIAGNOSIS — N2581 Secondary hyperparathyroidism of renal origin: Secondary | ICD-10-CM | POA: Diagnosis not present

## 2019-11-05 DIAGNOSIS — D631 Anemia in chronic kidney disease: Secondary | ICD-10-CM | POA: Diagnosis not present

## 2019-11-07 ENCOUNTER — Other Ambulatory Visit: Payer: Self-pay

## 2019-11-07 MED ORDER — PIOGLITAZONE HCL 45 MG PO TABS: 45.0000 mg | ORAL_TABLET | Freq: Every day | ORAL | 1 refills | Status: DC

## 2019-12-08 ENCOUNTER — Other Ambulatory Visit: Payer: Self-pay

## 2019-12-08 ENCOUNTER — Ambulatory Visit (INDEPENDENT_AMBULATORY_CARE_PROVIDER_SITE_OTHER): Payer: Medicare Other | Admitting: Family Medicine

## 2019-12-08 ENCOUNTER — Encounter: Payer: Self-pay | Admitting: Family Medicine

## 2019-12-08 VITALS — BP 147/86 | HR 71

## 2019-12-08 DIAGNOSIS — F419 Anxiety disorder, unspecified: Secondary | ICD-10-CM

## 2019-12-08 DIAGNOSIS — E7800 Pure hypercholesterolemia, unspecified: Secondary | ICD-10-CM | POA: Diagnosis not present

## 2019-12-08 DIAGNOSIS — Z79899 Other long term (current) drug therapy: Secondary | ICD-10-CM

## 2019-12-08 DIAGNOSIS — N183 Chronic kidney disease, stage 3 unspecified: Secondary | ICD-10-CM | POA: Diagnosis not present

## 2019-12-08 DIAGNOSIS — D84821 Immunodeficiency due to drugs: Secondary | ICD-10-CM

## 2019-12-08 DIAGNOSIS — Z0000 Encounter for general adult medical examination without abnormal findings: Secondary | ICD-10-CM

## 2019-12-08 DIAGNOSIS — I10 Essential (primary) hypertension: Secondary | ICD-10-CM | POA: Diagnosis not present

## 2019-12-08 DIAGNOSIS — E118 Type 2 diabetes mellitus with unspecified complications: Secondary | ICD-10-CM

## 2019-12-08 DIAGNOSIS — F329 Major depressive disorder, single episode, unspecified: Secondary | ICD-10-CM

## 2019-12-08 NOTE — Progress Notes (Signed)
Virtual Visit via Video Note  I connected with pt on 12/08/19 at  9:00 AM EST by a video enabled telemedicine application and verified that I am speaking with the correct person using two identifiers.  Location patient: home Location provider:work or home office Persons participating in the virtual visit: patient, provider  I discussed the limitations of evaluation and management by telemedicine and the availability of in person appointments. The patient expressed understanding and agreed to proceed.  Telemedicine visit is a necessity given the COVID-19 restrictions in place at the current time.  HPI: 68 y/o WM with hx of renal transplant being seen today for f/u CRI III, DM 2, HTN, HLD, and anxiety and depression.  Anx/dep: has been well controlled long term on fluoxetine and benzo (alpraz 0.5mg , 1 qd prn). He uses the alpraz very sparingly. PMP AWARE reviewed today: most recent rx for ambien filled 06/30/19, # 58, rx by myself. No red flags. Doing well, rarely needing benzo.  Saw local nephrologist las month, labs 11/04/19, "all pretty good". MD cut back on his mag ox to help cut back on his diarrhea.  Otherwise no other changes.  DM: Glucoses 110 avg, taking glipizide qd, also pioglitazone.  HTN: Home bp monitoring: usually 135/85 or better.  Never low.  HLD: toletating statin, compliant.  He does not do any formal exercise. Diet is fair.  ROS: no CP, no SOB, no wheezing, no cough, no dizziness, no HAs, no rashes, no melena/hematochezia.  No polyuria or polydipsia.  No myalgias or arthralgias.   Past Medical History:  Diagnosis Date  . Anxiety and depression    crowds, closed in spaces, used to sleep, prn  . Chronic renal insufficiency, stage III (moderate)    polycystic kidney dz. (followed by Dr. Patel/Nephrology)--kidney transplant 2004.  Cr rising a little: up to 1.85 feb 2019, 1.84.  Cr 1.68, GFR 41  on 07/08/18.No change 10/2018.  . DDD (degenerative disc disease),  lumbar   . Deep vein thrombosis (DVT) of left upper extremity (HCC)    due to PICC line  . Diabetes mellitus with complication (HCC)    Mild non-proliferative diab retinopathy OS.  Hba1c at nephrologist's 02/18/18 was 5.8%.  . Diverticulitis   . Gastritis 01/2017   likely due to alcohol  . GERD (gastroesophageal reflux disease)   . Glaucoma   . Gout   . H/O nonmelanoma skin cancer    SCC (chest, face, head)--s/p Moh's 2009  . H/O polycystic kidney disease   . History of depression   . History of kidney transplant    2004   . History of retinal detachment    Dr. Baird [facility]  . Hx of adenomatous colonic polyps 02/2012; 05/2014   tubular adenomas (Dr. Oletta Lamas)  . Hyperlipemia, mixed   . Hypertension   . Osteoarthritis    s/p R THR    Past Surgical History:  Procedure Laterality Date  . COLONOSCOPY  06/10/14   Tubular adenoma x 4: recall 3 yrs (Dr. [facility])  . COLONOSCOPY  03/19/2012   tubular adenoma x 1; diverticulosis, int hem.Procedure: COLONOSCOPY;  Surgeon: Winfield Cunas., MD;  Location: [facility] ENDOSCOPY;  Service: Endoscopy;  Laterality: N/A;  . HOT HEMOSTASIS  03/19/2012   Procedure: HOT HEMOSTASIS (ARGON PLASMA COAGULATION/BICAP);  Surgeon: Winfield Cunas., MD;  Location: [facility] ENDOSCOPY;  Service: Endoscopy;  Laterality: N/A;  . KIDNEY TRANSPLANT  2004  . NOSE SURGERY  09/03/2019   skin cancer  . TOTAL HIP ARTHROPLASTY  12/10/2012   Procedure: TOTAL HIP ARTHROPLASTY ANTERIOR APPROACH;  Surgeon: Mauri Pole, MD;  Location: [facility] ORS;  Service: Orthopedics;  Laterality: Right;    Family History  Problem Relation Age of Onset  . Diabetes Mother   . Heart attack Mother   . Hyperlipidemia Father   . Kidney disease Father   . Hypertension Father   . Kidney disease Brother   . Heart disease Brother   . Diabetes Maternal Grandmother   . Kidney disease Paternal Grandfather   . Polycystic kidney disease Unknown   . Anesthesia problems Neg Hx   . Hypotension Neg Hx    . Malignant hyperthermia Neg Hx   . Pseudochol deficiency Neg Hx     Social History   Socioeconomic History  . Marital status: Married    Spouse name: Not on file  . Number of children: Not on file  . Years of education: Not on file  . Highest education level: Not on file  Occupational History  . Not on file  Tobacco Use  . Smoking status: Former Smoker    Packs/day: 2.00    Years: 30.00    Pack years: 60.00    Types: Cigarettes    Quit date: 12/26/1983    Years since quitting: 35.9  . Smokeless tobacco: Never Used  Substance and Sexual Activity  . Alcohol use: Yes    Alcohol/week: 28.0 standard drinks    Types: 28 Glasses of wine per week    Comment: every day 3 glasses of wine  . Drug use: No  . Sexual activity: Not on file  Other Topics Concern  . Not on file  Social History Narrative   Married, one son.   Occupation: retired Merchant navy officer.   Orig from [place].   Former smoker x 20 yrs, quit in 72s.   Alcohol: 3 glasses of wine nightly, occ beer, rare hard liquor.   Social Determinants of Health   Financial Resource Strain:   . Difficulty of Paying Living Expenses: Not on file  Food Insecurity:   . Worried About Charity fundraiser in the Last Year: Not on file  . Ran Out of Food in the Last Year: Not on file  Transportation Needs:   . Lack of Transportation (Medical): Not on file  . Lack of Transportation (Non-Medical): Not on file  Physical Activity:   . Days of Exercise per Week: Not on file  . Minutes of Exercise per Session: Not on file  Stress:   . Feeling of Stress : Not on file  Social Connections:   . Frequency of Communication with Friends and Family: Not on file  . Frequency of Social Gatherings with Friends and Family: Not on file  . Attends Religious Services: Not on file  . Active Member of Clubs or Organizations: Not on file  . Attends Archivist Meetings: Not on file  . Marital Status: Not on file      Current  Outpatient Medications:  .  ACCU-CHEK SMARTVIEW test strip, USE TO TEST BLOOD SUGAR EVERY DAY, Disp: 100 each, Rfl: 0 .  allopurinol (ZYLOPRIM) 100 MG tablet, Take 200 mg by mouth at bedtime. , Disp: , Rfl:  .  ALPRAZolam (XANAX) 0.25 MG tablet, TAKE 1 TABLET BY MOUTH EVERY DAY AS NEEDED, Disp: 30 tablet, Rfl: 5 .  amLODipine (NORVASC) 5 MG tablet, Take 5 mg by mouth daily., Disp: , Rfl:  .  carboxymethylcellulose (REFRESH PLUS) 0.5 % SOLN, 1 drop 3 (three)  times daily as needed., Disp: , Rfl:  .  FLUoxetine (PROZAC) 40 MG capsule, Take 40 mg by mouth daily. Patient takes in am, Disp: , Rfl:  .  glipiZIDE (GLUCOTROL) 5 MG tablet, Take 5 mg by mouth. 1 tablet every morning, Disp: , Rfl:  .  MAGNESIUM-OXIDE 400 (241.3 Mg) MG tablet, daily. , Disp: , Rfl: 6 .  metoprolol tartrate (LOPRESSOR) 50 MG tablet, Take 1 tablet (50 mg total) by mouth 2 (two) times daily., Disp: 60 tablet, Rfl: 6 .  mycophenolate (CELLCEPT) 250 MG capsule, Take 250 mg by mouth 2 (two) times daily. , Disp: , Rfl:  .  pantoprazole (PROTONIX) 40 MG tablet, Take 40 mg by mouth 2 (two) times daily. , Disp: , Rfl:  .  pioglitazone (ACTOS) 45 MG tablet, Take 1 tablet (45 mg total) by mouth daily., Disp: 90 tablet, Rfl: 1 .  predniSONE (DELTASONE) 5 MG tablet, Take 5 mg by mouth daily., Disp: , Rfl:  .  rosuvastatin (CRESTOR) 20 MG tablet, TAKE 1 TABLET BY MOUTH EVERY DAY, Disp: 90 tablet, Rfl: 3 .  tacrolimus (PROGRAF) 0.5 MG capsule, Take 1 capsule by mouth 2 (two) times daily., Disp: , Rfl: 6 .  tacrolimus (PROGRAF) 1 MG capsule, Take 1 mg by mouth 2 (two) times daily. , Disp: , Rfl:  .  ZIOPTAN 0.0015 % SOLN, Place 1 drop into both eyes at bedtime., Disp: , Rfl: 3  EXAM:  VITALS per patient if applicable: BP (!) [id number] (BP Location: Left Arm, Patient Position: Sitting, Cuff Size: Large)   Pulse 71    GENERAL: alert, oriented, appears well and in no acute distress  HEENT: atraumatic, conjunttiva clear, no obvious  abnormalities on inspection of external nose and ears  NECK: normal movements of the head and neck  LUNGS: on inspection no signs of respiratory distress, breathing rate appears normal, no obvious gross SOB, gasping or wheezing  CV: no obvious cyanosis  MS: moves all visible extremities without noticeable abnormality  PSYCH/NEURO: pleasant and cooperative, no obvious depression or anxiety, speech and thought processing grossly intact.  MEDS: none today  Lab Results  Component Value Date   TSH 2.36 05/03/2016   Lab Results  Component Value Date   WBC 5.7 06/23/2019   HGB 14.0 06/23/2019   HCT 42.4 06/23/2019   MCV 100.0 06/23/2019   PLT 120.0 (L) 06/23/2019   Lab Results  Component Value Date   CREATININE 1.86 (H) 06/30/2019   BUN 39 (H) 06/30/2019   NA 136 06/30/2019   K 4.5 06/30/2019   CL 103 06/30/2019   CO2 22 06/30/2019   Lab Results  Component Value Date   ALT 15 06/23/2019   AST 14 06/23/2019   ALKPHOS 97 06/23/2019   BILITOT 0.8 06/23/2019   Lab Results  Component Value Date   CHOL 180 06/23/2019   Lab Results  Component Value Date   HDL 55.50 06/23/2019   Lab Results  Component Value Date   LDLCALC 77 11/05/2018   Lab Results  Component Value Date   TRIG 246.0 (H) 06/23/2019   Lab Results  Component Value Date   CHOLHDL 3 06/23/2019   Lab Results  Component Value Date   HGBA1C 6.1 06/23/2019    ASSESSMENT AND PLAN:  Discussed the following assessment and plan:  1) HTN: good control per home and other MD office monitoring. No changes today.  2) DM 2: great fasting gluc readings. Will see if nephrologist's latest labs show A1c (  Nov 2020).  3) HLD: tolerating statin.  4) Dep and anx: very stable.  No changes. He did not need new rx for these meds today. CSC needs renewal at next visit in-office.  5) Preventative health care: last colonoscopy in EMR 2015--recall 5 yrs, but pt seems to think he has had one since then ([facility]).   Will get [facility] records of most recent colonoscopy to clarify. Even if he is due at this time, I advised him to put this off until covid pandemic is over.  -we discussed possible serious and likely etiologies, options for evaluation and workup, limitations of telemedicine visit vs in person visit, treatment, treatment risks and precautions. Pt prefers to treat via telemedicine empirically rather then risking or undertaking an in person visit at this moment. Patient agrees to seek prompt in person care if worsening, new symptoms arise, or if is not improving with treatment.   I discussed the assessment and treatment plan with the patient. The patient was provided an opportunity to ask questions and all were answered. The patient agreed with the plan and demonstrated an understanding of the instructions.   The patient was advised to call back or seek an in-person evaluation if the symptoms worsen or if the condition fails to improve as anticipated.  F/u: 6 mo RCI  Signed:  Crissie Sickles, MD           12/08/2019

## 2019-12-16 ENCOUNTER — Encounter: Payer: Self-pay | Admitting: Family Medicine

## 2020-01-06 LAB — HM DIABETES EYE EXAM

## 2020-01-08 ENCOUNTER — Encounter: Payer: Self-pay | Admitting: Family Medicine

## 2020-01-12 ENCOUNTER — Encounter: Payer: Self-pay | Admitting: Family Medicine

## 2020-01-14 DIAGNOSIS — L905 Scar conditions and fibrosis of skin: Secondary | ICD-10-CM | POA: Diagnosis not present

## 2020-01-14 DIAGNOSIS — L821 Other seborrheic keratosis: Secondary | ICD-10-CM | POA: Diagnosis not present

## 2020-01-14 DIAGNOSIS — C4401 Basal cell carcinoma of skin of lip: Secondary | ICD-10-CM | POA: Diagnosis not present

## 2020-01-14 DIAGNOSIS — L218 Other seborrheic dermatitis: Secondary | ICD-10-CM | POA: Diagnosis not present

## 2020-01-29 DIAGNOSIS — Z86010 Personal history of colonic polyps: Secondary | ICD-10-CM | POA: Diagnosis not present

## 2020-01-29 DIAGNOSIS — R748 Abnormal levels of other serum enzymes: Secondary | ICD-10-CM | POA: Diagnosis not present

## 2020-01-29 DIAGNOSIS — Z940 Kidney transplant status: Secondary | ICD-10-CM | POA: Diagnosis not present

## 2020-01-29 DIAGNOSIS — K219 Gastro-esophageal reflux disease without esophagitis: Secondary | ICD-10-CM | POA: Diagnosis not present

## 2020-02-09 ENCOUNTER — Encounter: Payer: Self-pay | Admitting: Family Medicine

## 2020-02-25 DIAGNOSIS — C4401 Basal cell carcinoma of skin of lip: Secondary | ICD-10-CM | POA: Diagnosis not present

## 2020-02-27 DIAGNOSIS — Z940 Kidney transplant status: Secondary | ICD-10-CM | POA: Diagnosis not present

## 2020-02-27 DIAGNOSIS — M109 Gout, unspecified: Secondary | ICD-10-CM | POA: Diagnosis not present

## 2020-02-27 DIAGNOSIS — N183 Chronic kidney disease, stage 3 unspecified: Secondary | ICD-10-CM | POA: Diagnosis not present

## 2020-02-27 DIAGNOSIS — E139 Other specified diabetes mellitus without complications: Secondary | ICD-10-CM | POA: Diagnosis not present

## 2020-02-27 LAB — BASIC METABOLIC PANEL
BUN: 39 — AB (ref 4–21)
CO2: 22 (ref 13–22)
Chloride: 103 (ref 99–108)
Creatinine: 1.8 — AB (ref 0.6–1.3)
Glucose: 128
Potassium: 5.1 (ref 3.4–5.3)
Sodium: 138 (ref 137–147)

## 2020-02-27 LAB — HEPATIC FUNCTION PANEL
ALT: 13 (ref 10–40)
AST: 14 (ref 14–40)
Alkaline Phosphatase: 118 (ref 25–125)
Bilirubin, Total: 0.4

## 2020-02-27 LAB — COMPREHENSIVE METABOLIC PANEL
Albumin: 3.8 (ref 3.5–5.0)
Calcium: 9.1 (ref 8.7–10.7)
GFR calc non Af Amer: 38
Globulin: 2.4

## 2020-02-27 LAB — CBC AND DIFFERENTIAL
HCT: 40 — AB (ref 41–53)
Hemoglobin: 13.2 — AB (ref 13.5–17.5)
Platelets: 129 — AB (ref 150–399)
WBC: 6.4

## 2020-02-27 LAB — HEMOGLOBIN A1C: Hemoglobin A1C: 5.7

## 2020-02-27 LAB — CBC: RBC: 3.97 (ref 3.87–5.11)

## 2020-02-27 LAB — LIPID PANEL
Cholesterol: 157 (ref 0–200)
HDL: 55 (ref 35–70)
LDL Cholesterol: 71
Triglycerides: 184 — AB (ref 40–160)

## 2020-03-05 ENCOUNTER — Other Ambulatory Visit: Payer: Self-pay | Admitting: Family Medicine

## 2020-03-05 DIAGNOSIS — Z940 Kidney transplant status: Secondary | ICD-10-CM | POA: Diagnosis not present

## 2020-03-05 DIAGNOSIS — D631 Anemia in chronic kidney disease: Secondary | ICD-10-CM | POA: Diagnosis not present

## 2020-03-05 DIAGNOSIS — N2581 Secondary hyperparathyroidism of renal origin: Secondary | ICD-10-CM | POA: Diagnosis not present

## 2020-03-05 DIAGNOSIS — I129 Hypertensive chronic kidney disease with stage 1 through stage 4 chronic kidney disease, or unspecified chronic kidney disease: Secondary | ICD-10-CM | POA: Diagnosis not present

## 2020-03-05 DIAGNOSIS — N183 Chronic kidney disease, stage 3 unspecified: Secondary | ICD-10-CM | POA: Diagnosis not present

## 2020-03-05 MED ORDER — ALPRAZOLAM 0.25 MG PO TABS: 0.2500 mg | ORAL_TABLET | Freq: Every day | ORAL | 5 refills | Status: DC | PRN

## 2020-03-05 NOTE — Telephone Encounter (Signed)
RF request for alprazolam. Last RX 06/30/2019# 30 x 5 RFS Last OV 12/08/2019, patient to follow up in 6 months. NO upcoming OV  RF appropriate?

## 2020-03-08 ENCOUNTER — Other Ambulatory Visit: Payer: Self-pay

## 2020-03-08 MED ORDER — PIOGLITAZONE HCL 45 MG PO TABS: 45.0000 mg | ORAL_TABLET | Freq: Every day | ORAL | 0 refills | Status: DC

## 2020-03-29 ENCOUNTER — Telehealth: Payer: Self-pay

## 2020-03-29 ENCOUNTER — Ambulatory Visit (INDEPENDENT_AMBULATORY_CARE_PROVIDER_SITE_OTHER): Payer: Medicare Other | Admitting: Family Medicine

## 2020-03-29 ENCOUNTER — Other Ambulatory Visit: Payer: Self-pay

## 2020-03-29 ENCOUNTER — Encounter: Payer: Self-pay | Admitting: Family Medicine

## 2020-03-29 VITALS — BP 111/72 | HR 81 | Temp 97.7°F | Resp 16 | Ht 72.0 in | Wt 256.0 lb

## 2020-03-29 DIAGNOSIS — Z940 Kidney transplant status: Secondary | ICD-10-CM

## 2020-03-29 DIAGNOSIS — N183 Chronic kidney disease, stage 3 unspecified: Secondary | ICD-10-CM

## 2020-03-29 DIAGNOSIS — T148XXA Other injury of unspecified body region, initial encounter: Secondary | ICD-10-CM | POA: Diagnosis not present

## 2020-03-29 DIAGNOSIS — L089 Local infection of the skin and subcutaneous tissue, unspecified: Secondary | ICD-10-CM

## 2020-03-29 DIAGNOSIS — S51011A Laceration without foreign body of right elbow, initial encounter: Secondary | ICD-10-CM

## 2020-03-29 DIAGNOSIS — Z79899 Other long term (current) drug therapy: Secondary | ICD-10-CM

## 2020-03-29 DIAGNOSIS — D84821 Immunodeficiency due to drugs: Secondary | ICD-10-CM

## 2020-03-29 MED ORDER — HYDROCODONE-ACETAMINOPHEN 5-325 MG PO TABS: 1.0000 | ORAL_TABLET | Freq: Four times a day (QID) | ORAL | 0 refills | Status: DC | PRN

## 2020-03-29 MED ORDER — DOXYCYCLINE HYCLATE 100 MG PO CAPS: 100.0000 mg | ORAL_CAPSULE | Freq: Two times a day (BID) | ORAL | 0 refills | Status: DC

## 2020-03-29 NOTE — Progress Notes (Signed)
OFFICE VISIT  03/29/2020   CC:  Chief Complaint  Patient presents with  . Wound on Right arm    injury on Thursday, scraped his skin back on ceramic tile. Has used     HPI:    Patient is a 69 y.o. Caucasian male who presents for right arm skin tear sustained 4 days ago. Carrying luggage and groceries, tripped on top step of home and fell onto R arm against ceramic tile, tore skin, not very deep.  Left arm got a bruise.  No other injury sustained. Gauze use sticks and causes bleeding and significant pain from it sticking to skin/wound--despite use of otc "non-stick" gauze.  Hurts appropriately for tear, maybe a bit worse b/c he has been messing with it a lot.  No fevers or malaise.  Saw Dr. Posey Pronto 01/2020 but I don't have record of this. Pt says labs were "great" at that time.  Has annual transplant clinic f/u this summer.  Past Medical History:  Diagnosis Date  . Anxiety and depression    crowds, closed in spaces, used to sleep, prn  . Chronic renal insufficiency, stage III (moderate)    polycystic kidney dz. (followed by Dr. Patel/Nephrology)--kidney transplant 2004.  Cr rising a little: up to 1.85 feb 2019, 1.84.  Cr 1.68, GFR 41  on 07/08/18.No change 10/2018.  . DDD (degenerative disc disease), lumbar   . Deep vein thrombosis (DVT) of left upper extremity (HCC)    due to PICC line  . Diabetes mellitus with complication (HCC)    Mild non-proliferative diab retinopathy OS.  Hba1c at nephrologist's 02/18/18 was 5.8%.  . Diverticulosis    + hx of 'itis  . Gastritis 01/2017   likely due to alcohol  . GERD (gastroesophageal reflux disease)   . Glaucoma    severe  . Gout   . H/O nonmelanoma skin cancer    SCC (chest, face, head)--s/p Moh's 2009  . H/O polycystic kidney disease   . History of depression   . History of kidney transplant    2004   . History of retinal detachment    Dr. Baird [facility]  . Hx of adenomatous colonic polyps 02/2012; 05/2014; 08/2017   2013 and 2015 tubular  adenomas (Dr. Oletta Lamas). 08/2017 NO POLYPS->recall 5 yrs.  . Hyperlipemia, mixed   . Hypertension   . Osteoarthritis    s/p R THR    Past Surgical History:  Procedure Laterality Date  . COLONOSCOPY  06/10/14; 09/05/17   2015: Tubular adenoma x 4: recall 3 yrs (Dr. [facility]). 08/2017 NO POLYPS. Recall 5 yrs.  . COLONOSCOPY  03/19/2012   tubular adenoma x 1; diverticulosis, int hem.Procedure: COLONOSCOPY;  Surgeon: Winfield Cunas., MD;  Location: [facility] ENDOSCOPY;  Service: Endoscopy;  Laterality: N/A;  . HOT HEMOSTASIS  03/19/2012   Procedure: HOT HEMOSTASIS (ARGON PLASMA COAGULATION/BICAP);  Surgeon: Winfield Cunas., MD;  Location: [facility] ENDOSCOPY;  Service: Endoscopy;  Laterality: N/A;  . KIDNEY TRANSPLANT  2004  . NOSE SURGERY  09/03/2019   skin cancer  . TOTAL HIP ARTHROPLASTY  12/10/2012   Procedure: TOTAL HIP ARTHROPLASTY ANTERIOR APPROACH;  Surgeon: Mauri Pole, MD;  Location: [facility] ORS;  Service: Orthopedics;  Laterality: Right;    Outpatient Medications Prior to Visit  Medication Sig Dispense Refill  . ACCU-CHEK SMARTVIEW test strip USE TO TEST BLOOD SUGAR EVERY DAY 100 each 0  . allopurinol (ZYLOPRIM) 100 MG tablet Take 200 mg by mouth at bedtime.     Marland Kitchen  ALPRAZolam (XANAX) 0.25 MG tablet Take 1 tablet (0.25 mg total) by mouth daily as needed. 30 tablet 5  . amLODipine (NORVASC) 5 MG tablet Take 5 mg by mouth daily.    Marland Kitchen FLUoxetine (PROZAC) 40 MG capsule Take 40 mg by mouth daily. Patient takes in am    . glipiZIDE (GLUCOTROL) 5 MG tablet Take 5 mg by mouth. 1 tablet every morning    . hydrocortisone 2.5 % cream APPLY EXTERNALLY TO THE AFFECTED AREA TWICE DAILY    . MAGNESIUM-OXIDE 400 (241.3 Mg) MG tablet daily.   6  . metoprolol tartrate (LOPRESSOR) 50 MG tablet Take 1 tablet (50 mg total) by mouth 2 (two) times daily. 60 tablet 6  . mycophenolate (CELLCEPT) 250 MG capsule Take 250 mg by mouth 2 (two) times daily.     . pantoprazole (PROTONIX) 20 MG tablet Take 20 mg by  mouth every morning.    . pioglitazone (ACTOS) 45 MG tablet Take 1 tablet (45 mg total) by mouth daily. 90 tablet 0  . predniSONE (DELTASONE) 5 MG tablet Take 5 mg by mouth daily.    . rosuvastatin (CRESTOR) 20 MG tablet TAKE 1 TABLET BY MOUTH EVERY DAY 90 tablet 3  . tacrolimus (PROGRAF) 0.5 MG capsule Take 1 capsule by mouth 2 (two) times daily.  6  . tacrolimus (PROGRAF) 1 MG capsule Take 1 mg by mouth 2 (two) times daily.     Marland Kitchen ZIOPTAN 0.0015 % SOLN Place 1 drop into both eyes at bedtime.  3  . carboxymethylcellulose (REFRESH PLUS) 0.5 % SOLN 1 drop 3 (three) times daily as needed.    . pantoprazole (PROTONIX) 40 MG tablet Take 40 mg by mouth 2 (two) times daily.      No facility-administered medications prior to visit.    Allergies  Allergen Reactions  . Lipitor [Atorvastatin] Other (See Comments)    Headaches  . Zocor [Simvastatin] Other (See Comments)    Myalgia  . Penicillins Rash    ROS As per HPI  PE: Blood pressure 111/72, pulse 81, temperature 97.7 F (36.5 C), temperature source Temporal, resp. rate 16, height 6' (1.829 m), weight 256 lb (116.1 kg), SpO2 95 %. Gen: Alert, well appearing.  Patient is oriented to person, place, time, and situation. AFFECT: pleasant, lucid thought and speech. Right arm with approx 10 cm skin tear---minimal depth with traces of bleeding, significant amount of gauze dried/adherent to the bed of the wound.  Skin surrounding the wound is violaceous c/w signif bruising but also a bit of subtle erythema at the skin distal to lower border of the wound.  Minimal warmth and tenderness to this region. No pus/exudate.  No palpable hematoma.  LABS:    Chemistry      Component Value Date/Time   NA 138 11/04/2019 0000   K 4.6 11/04/2019 0000   CL 106 11/04/2019 0000   CO2 22 11/04/2019 0000   BUN 43 (A) 11/04/2019 0000   CREATININE 1.9 (A) 11/04/2019 0000   CREATININE 1.86 (H) 06/30/2019 1412   CREATININE 1.85 02/18/2018 0000   GLU 153  11/04/2019 0000      Component Value Date/Time   CALCIUM 9.5 11/04/2019 0000   ALKPHOS 141 (A) 11/04/2019 0000   AST 15 11/04/2019 0000   ALT 15 11/04/2019 0000   BILITOT 0.8 06/23/2019 0910       IMPRESSION AND PLAN:  Right arm skin tear: removed gauze today with tweezers/forceps but required lots of patience/use of gentle irrigation  in order to separate it from wound w/out excessive pain. Mild bleeding from doing this, stopped with pressure w/out significant problem. Wrapped wound wet-to-dry and encouraged leaving arm open to air as much as possible during daytime.   Worried about skin inferior to wound a bit infected plus he is immunocompromised from his anti-rejection meds. Doxycycline 100 mg bid x 7d. Vicodin 5/325, 1-2 q6h prn, #15.  CRI, hx of renal transplant. Has had appropriate f/u with nephrologist + labs 01/2020-->will request note/labs from Dr. Serita Grit office.  Therapeutic expectations and side effect profile of medication discussed today.  Patient's questions answered.  Spent 50 min with pt today working on getting the gauze wet/irrigating the wound, gently pulling off the gauze that was adhered to the bed of his skin tear.  Also discussion of possible early cellulitis and tx with abx. Also use of pain med prn for excessive pain.  Discussion of plan for home wound care to try to prevent this from happening again, yet protect wound the best we can while it slowly heals.  An After Visit Summary was printed and given to the patient.  FOLLOW UP: Return in about 1 week (around 04/05/2020) for f/u R arm skin tear.  Signed:  Crissie Sickles, MD           03/29/2020

## 2020-03-29 NOTE — Telephone Encounter (Signed)
Patient wife (DPR) calling about patient fell on Friday 4/2, he has a very wound on his arm, and cannot get to stop bleeding.  When they called on Friday, he reached after hours on call nurse. (please see telephone note created for after hours call) Per patient wife, nurse on call told him to go to ER / Wound Care.  Patient declined.  Wife is saying that wound has not stopped bleeding since Friday.  Please advise. Patient can be reached at [phone]

## 2020-03-29 NOTE — Telephone Encounter (Signed)
This message has been added to separate telephone encounter already started from [facility], sent to PCP.

## 2020-03-29 NOTE — Telephone Encounter (Signed)
Patient scheduled 4.5.21

## 2020-03-29 NOTE — Telephone Encounter (Signed)
OK to put on schedule at 4 today

## 2020-03-29 NOTE — Telephone Encounter (Signed)
[facility] - Client TELEPHONE ADVICE RECORD [facility] Patient Name: James Zimmerman Gender: Male DOB: 1951-05-12 Age: 69 Y 36 M 18 D Return Phone Number: [phone] (Primary), [phone] (Secondary) Address: City/State/Zip: [place] [address] Client [facility] - Client Client Site [place] - Day Physician Crissie Sickles - MD Contact Type Call Who Is Calling Patient / Member / Family / Caregiver Call Type Triage / Clinical Caller Name Nathenial Beacham Relationship To Patient Spouse Return Phone Number [phone] (Primary)  Chief Complaint Arm Injury  Reason for Call Symptomatic / Request for Morgan states she husband fell yesterday and his right arm is bleeding and she wants to know if she should take him to the ED.  Translation No Nurse Assessment Nurse: Self, RN, Nira Conn Date/Time (Eastern Time): 03/26/2020 3:21:22 PM Confirm and document reason for call. If symptomatic, describe symptoms. ---Caller says husband fell yesterday and hit his arm, Caller says he is not on blood thinners , but some of his meds make him bleed and bruise easily. Caller says he has a 6 inch skin tear on his arm. Has the patient had close contact with a person known or suspected to have the novel coronavirus illness OR traveled / lives in area with major community spread (including international travel) in the last 14 days from the onset of symptoms? * If Asymptomatic, screen for exposure and travel within the last 14 days. ---No Does the patient have any new or worsening symptoms? ---Yes Will a triage be completed? ---Yes Related visit to physician within the last 2 weeks? ---No Does the PT have any chronic conditions? (i.e. diabetes, asthma, this includes High risk factors for pregnancy, etc.) ---Yes List chronic conditions. ---Kidney disease, D Mellitus, HTN Is this a behavioral  health or substance abuse call? ---No Guidelines Guideline Title Affirmed Question Affirmed Notes Nurse Date/Time (Eastern Time) Arm Injury Skin is split open or gaping (or length > 1/2 inch or 12 mm) Self, RN, Nira Conn 03/26/2020 3:24:19 PMPLEASE NOTE: All timestamps contained within this report are represented as [place] Standard Time. CONFIDENTIALTY NOTICE: This fax transmission is intended only for the addressee. It contains information that is legally privileged, confidential or otherwise protected from use or disclosure. If you are not the intended recipient, you are strictly prohibited from reviewing, disclosing, copying using or disseminating any of this information or taking any action in reliance on or regarding this information. If you have received this fax in error, please notify us immediately by telephone so that we can arrange for its return to [place]. Phone: [phone], Toll-Free: [phone], Fax: [phone] Page: 2 of 2 Call Id: [id number] [place]. Time Eilene Ghazi Time) Disposition Final User 03/26/2020 3:28:33 PM Go to ED Now Yes Self, RN, Soyla Murphy Disagree/Comply Disagree Caller Understands Yes PreDisposition Home Care Care Advice Given Per Guideline GO TO ED NOW: CLEAN THE WOUND: CARE ADVICE given per Arm Injury (Adult) guideline. * Use a sterile gauze, an adhesive bandage (such as a Band-Aid), or a clean cloth. DRESSING THE WOUND: * Cover the wound with a dressing. Comments User: Robynn Pane, RN Date/Time Eilene Ghazi Time): 03/26/2020 3:31:22 PM Caller says her spouse will not go to ED and UC , caller was instructed to use non stick dressing and to keep clean and dry and that he really need to have the wound evaluated , caller verbalized understanding. Referrals GO TO FACILITY REFUSED

## 2020-03-29 NOTE — Telephone Encounter (Signed)
Please assist pt with scheduling, thanks.

## 2020-03-29 NOTE — Telephone Encounter (Signed)
Patient wife (DPR) calling about patient fell on Friday 4/2, he has a very wound on his arm, and cannot get to stop bleeding.  When they called on Friday, he reached after hours on call nurse. (please see telephone note created for after hours call) Per patient wife, nurse on call told him to go to ER / Wound Care.  Patient declined.  Wife is saying that wound has not stopped bleeding since Friday.  Please advise. Patient can be reached at [phone]

## 2020-04-02 ENCOUNTER — Encounter: Payer: Self-pay | Admitting: Family Medicine

## 2020-04-05 ENCOUNTER — Other Ambulatory Visit: Payer: Self-pay

## 2020-04-05 ENCOUNTER — Ambulatory Visit (INDEPENDENT_AMBULATORY_CARE_PROVIDER_SITE_OTHER): Payer: Medicare Other | Admitting: Family Medicine

## 2020-04-05 ENCOUNTER — Encounter: Payer: Self-pay | Admitting: Family Medicine

## 2020-04-05 VITALS — BP 120/76 | HR 77 | Temp 98.2°F | Resp 16 | Ht 72.0 in | Wt 256.0 lb

## 2020-04-05 DIAGNOSIS — S51011D Laceration without foreign body of right elbow, subsequent encounter: Secondary | ICD-10-CM

## 2020-04-05 NOTE — Progress Notes (Signed)
OFFICE VISIT  04/05/2020   CC:  Chief Complaint  Patient presents with  . Follow-up    right arm skin tear, 1 week   HPI:    Patient is a 69 y.o. Caucasian male who presents for 1 wk f/u R arm skin tear.  A/P as of last visit: " Right arm skin tear: removed gauze today with tweezers/forceps but required lots of patience/use of gentle irrigation in order to separate it from wound w/out excessive pain. Mild bleeding from doing this, stopped with pressure w/out significant problem. Wrapped wound wet-to-dry and encouraged leaving arm open to air as much as possible during daytime.   Worried about skin inferior to wound a bit infected plus he is immunocompromised from his anti-rejection meds. Doxycycline 100 mg bid x 7d. Vicodin 5/325, 1-2 q6h prn, #15.  CRI, hx of renal transplant. Has had appropriate f/u with nephrologist + labs 01/2020-->will request note/labs from Dr. Serita Grit office."  Interim hx: Doing better. A bit more bleeding after last visit b/c gauze kept sticking when he tried to pull it off. Now it is scabbed over completely, some pinkish skin stil a couple cm out from borders of wound circumferentially. Not hurting unless he bumps it against something.  No exudate, no streaking. He took his 7d of doxy.   Past Medical History:  Diagnosis Date  . Anxiety and depression    crowds, closed in spaces, used to sleep, prn  . Chronic renal insufficiency, stage III (moderate)    polycystic kidney dz. (followed by Dr. Patel/Nephrology)--kidney transplant 2004.  Cr rising a little: up to 1.85 feb 2019, 1.84.  Cr 1.68, GFR 41  on 07/08/18.No change 10/2018.  . DDD (degenerative disc disease), lumbar   . Deep vein thrombosis (DVT) of left upper extremity (HCC)    due to PICC line  . Diabetes mellitus with complication (HCC)    Mild non-proliferative diab retinopathy OS.  Hba1c at nephrologist's 02/18/18 was 5.8%.  . Diverticulosis    + hx of 'itis  . Gastritis 01/2017   likely  due to alcohol  . GERD (gastroesophageal reflux disease)   . Glaucoma    severe  . Gout   . H/O nonmelanoma skin cancer    SCC (chest, face, head)--s/p Moh's 2009  . H/O polycystic kidney disease   . History of depression   . History of kidney transplant    2004   . History of retinal detachment    Dr. Baird Cancer  . Hx of adenomatous colonic polyps 02/2012; 05/2014; 08/2017   2013 and 2015 tubular adenomas (Dr. Oletta Lamas). 08/2017 NO POLYPS->recall 5 yrs.  . Hyperlipemia, mixed   . Hypertension   . Osteoarthritis    s/p R THR    Past Surgical History:  Procedure Laterality Date  . COLONOSCOPY  06/10/14; 09/05/17   2015: Tubular adenoma x 4: recall 3 yrs (Dr. Alferd Apa [facility]). 08/2017 NO POLYPS. Recall 5 yrs.  . COLONOSCOPY  03/19/2012   tubular adenoma x 1; diverticulosis, int hem.Procedure: COLONOSCOPY;  Surgeon: Winfield Cunas., MD;  Location: [facility] ENDOSCOPY;  Service: Endoscopy;  Laterality: N/A;  . HOT HEMOSTASIS  03/19/2012   Procedure: HOT HEMOSTASIS (ARGON PLASMA COAGULATION/BICAP);  Surgeon: Winfield Cunas., MD;  Location: [facility] ENDOSCOPY;  Service: Endoscopy;  Laterality: N/A;  . KIDNEY TRANSPLANT  2004  . NOSE SURGERY  09/03/2019   skin cancer  . TOTAL HIP ARTHROPLASTY  12/10/2012   Procedure: TOTAL HIP ARTHROPLASTY ANTERIOR APPROACH;  Surgeon: Pietro Cassis  Alvan Dame, MD;  Location: [facility] ORS;  Service: Orthopedics;  Laterality: Right;    Outpatient Medications Prior to Visit  Medication Sig Dispense Refill  . ACCU-CHEK SMARTVIEW test strip USE TO TEST BLOOD SUGAR EVERY DAY 100 each 0  . allopurinol (ZYLOPRIM) 100 MG tablet Take 200 mg by mouth at bedtime.     . ALPRAZolam (XANAX) 0.25 MG tablet Take 1 tablet (0.25 mg total) by mouth daily as needed. 30 tablet 5  . amLODipine (NORVASC) 5 MG tablet Take 5 mg by mouth daily.    Marland Kitchen FLUoxetine (PROZAC) 40 MG capsule Take 40 mg by mouth daily. Patient takes in am    . glipiZIDE (GLUCOTROL) 5 MG tablet Take 5 mg by mouth. 1 tablet every  morning    . hydrocortisone 2.5 % cream APPLY EXTERNALLY TO THE AFFECTED AREA TWICE DAILY    . MAGNESIUM-OXIDE 400 (241.3 Mg) MG tablet daily.   6  . metoprolol tartrate (LOPRESSOR) 50 MG tablet Take 1 tablet (50 mg total) by mouth 2 (two) times daily. 60 tablet 6  . mycophenolate (CELLCEPT) 250 MG capsule Take 250 mg by mouth 2 (two) times daily.     . pantoprazole (PROTONIX) 20 MG tablet Take 20 mg by mouth every morning.    . pioglitazone (ACTOS) 45 MG tablet Take 1 tablet (45 mg total) by mouth daily. 90 tablet 0  . predniSONE (DELTASONE) 5 MG tablet Take 5 mg by mouth daily.    . rosuvastatin (CRESTOR) 20 MG tablet TAKE 1 TABLET BY MOUTH EVERY DAY 90 tablet 3  . tacrolimus (PROGRAF) 0.5 MG capsule Take 1 capsule by mouth 2 (two) times daily.  6  . tacrolimus (PROGRAF) 1 MG capsule Take 1 mg by mouth 2 (two) times daily.     Marland Kitchen ZIOPTAN 0.0015 % SOLN Place 1 drop into both eyes at bedtime.  3  . doxycycline (VIBRAMYCIN) 100 MG capsule Take 1 capsule (100 mg total) by mouth 2 (two) times daily for 7 days. 14 capsule 0  . HYDROcodone-acetaminophen (NORCO/VICODIN) 5-325 MG tablet Take 1-2 tablets by mouth every 6 (six) hours as needed for moderate pain. 15 tablet 0   No facility-administered medications prior to visit.    Allergies  Allergen Reactions  . Lipitor [Atorvastatin] Other (See Comments)    Headaches  . Zocor [Simvastatin] Other (See Comments)    Myalgia  . Penicillins Rash    ROS As per HPI  PE: Blood pressure 120/76, pulse 77, temperature 98.2 F (36.8 C), temperature source Temporal, resp. rate 16, height 6' (1.829 m), weight 256 lb (116.1 kg), SpO2 94 %. Gen: Alert, well appearing.  Patient is oriented to person, place, time, and situation. AFFECT: pleasant, lucid thought and speech. R forearm large scab over superficial wound bed.  Mild surrounding erythema largely unchanged from last exam. Minimally TTP.  LABS:  none  IMPRESSION AND PLAN:  R forearm skin tag,  now scabbed over and feeling fine. No sign of infxn but I outlined the area of pink surrounding the wound and told him that if it starts to spread beyond the borders of the pen line in the next 4-5d he needs to call or return.   An After Visit Summary was printed and given to the patient.  FOLLOW UP: No follow-ups on file.  Signed:  Crissie Sickles, MD           04/05/2020

## 2020-04-07 ENCOUNTER — Encounter: Payer: Self-pay | Admitting: Family Medicine

## 2020-04-12 ENCOUNTER — Encounter: Payer: Self-pay | Admitting: Family Medicine

## 2020-04-24 DIAGNOSIS — I480 Paroxysmal atrial fibrillation: Secondary | ICD-10-CM

## 2020-04-24 DIAGNOSIS — A419 Sepsis, unspecified organism: Secondary | ICD-10-CM

## 2020-04-24 HISTORY — DX: Paroxysmal atrial fibrillation: I48.0

## 2020-04-24 HISTORY — DX: Sepsis, unspecified organism: A41.9

## 2020-04-29 DIAGNOSIS — K5730 Diverticulosis of large intestine without perforation or abscess without bleeding: Secondary | ICD-10-CM | POA: Diagnosis not present

## 2020-04-29 DIAGNOSIS — R509 Fever, unspecified: Secondary | ICD-10-CM | POA: Diagnosis not present

## 2020-04-29 DIAGNOSIS — A419 Sepsis, unspecified organism: Secondary | ICD-10-CM | POA: Diagnosis not present

## 2020-04-29 DIAGNOSIS — N400 Enlarged prostate without lower urinary tract symptoms: Secondary | ICD-10-CM | POA: Diagnosis not present

## 2020-04-29 DIAGNOSIS — K760 Fatty (change of) liver, not elsewhere classified: Secondary | ICD-10-CM | POA: Diagnosis not present

## 2020-04-29 DIAGNOSIS — N390 Urinary tract infection, site not specified: Secondary | ICD-10-CM | POA: Diagnosis not present

## 2020-04-30 DIAGNOSIS — N400 Enlarged prostate without lower urinary tract symptoms: Secondary | ICD-10-CM | POA: Diagnosis not present

## 2020-04-30 DIAGNOSIS — R4182 Altered mental status, unspecified: Secondary | ICD-10-CM | POA: Diagnosis not present

## 2020-04-30 DIAGNOSIS — N390 Urinary tract infection, site not specified: Secondary | ICD-10-CM | POA: Diagnosis not present

## 2020-04-30 DIAGNOSIS — A419 Sepsis, unspecified organism: Secondary | ICD-10-CM | POA: Diagnosis not present

## 2020-04-30 DIAGNOSIS — R110 Nausea: Secondary | ICD-10-CM | POA: Diagnosis not present

## 2020-04-30 DIAGNOSIS — R109 Unspecified abdominal pain: Secondary | ICD-10-CM | POA: Diagnosis not present

## 2020-04-30 DIAGNOSIS — Z87891 Personal history of nicotine dependence: Secondary | ICD-10-CM | POA: Diagnosis not present

## 2020-04-30 DIAGNOSIS — R509 Fever, unspecified: Secondary | ICD-10-CM | POA: Diagnosis not present

## 2020-04-30 DIAGNOSIS — R0602 Shortness of breath: Secondary | ICD-10-CM | POA: Diagnosis not present

## 2020-05-01 ENCOUNTER — Encounter: Payer: Self-pay | Admitting: Family Medicine

## 2020-05-01 DIAGNOSIS — A419 Sepsis, unspecified organism: Secondary | ICD-10-CM | POA: Diagnosis not present

## 2020-05-01 DIAGNOSIS — N400 Enlarged prostate without lower urinary tract symptoms: Secondary | ICD-10-CM | POA: Diagnosis not present

## 2020-05-01 DIAGNOSIS — N390 Urinary tract infection, site not specified: Secondary | ICD-10-CM | POA: Diagnosis not present

## 2020-05-02 ENCOUNTER — Emergency Department (HOSPITAL_COMMUNITY): Payer: Medicare Other

## 2020-05-02 ENCOUNTER — Encounter (HOSPITAL_COMMUNITY): Payer: Self-pay | Admitting: Emergency Medicine

## 2020-05-02 ENCOUNTER — Inpatient Hospital Stay (HOSPITAL_COMMUNITY)
Admission: EM | Admit: 2020-05-02 | Discharge: 2020-05-08 | DRG: 871 | Disposition: A | Discharge status: Home Health | Payer: Medicare Other | Attending: Internal Medicine | Admitting: Internal Medicine

## 2020-05-02 ENCOUNTER — Other Ambulatory Visit: Payer: Self-pay

## 2020-05-02 DIAGNOSIS — N1832 Chronic kidney disease, stage 3b: Secondary | ICD-10-CM | POA: Diagnosis present

## 2020-05-02 DIAGNOSIS — J189 Pneumonia, unspecified organism: Secondary | ICD-10-CM

## 2020-05-02 DIAGNOSIS — D539 Nutritional anemia, unspecified: Secondary | ICD-10-CM | POA: Diagnosis present

## 2020-05-02 DIAGNOSIS — Z940 Kidney transplant status: Secondary | ICD-10-CM

## 2020-05-02 DIAGNOSIS — D696 Thrombocytopenia, unspecified: Secondary | ICD-10-CM | POA: Diagnosis present

## 2020-05-02 DIAGNOSIS — F10131 Alcohol abuse with withdrawal delirium: Secondary | ICD-10-CM | POA: Diagnosis not present

## 2020-05-02 DIAGNOSIS — Z79899 Other long term (current) drug therapy: Secondary | ICD-10-CM

## 2020-05-02 DIAGNOSIS — E860 Dehydration: Secondary | ICD-10-CM | POA: Diagnosis not present

## 2020-05-02 DIAGNOSIS — J9602 Acute respiratory failure with hypercapnia: Secondary | ICD-10-CM | POA: Diagnosis not present

## 2020-05-02 DIAGNOSIS — Z87891 Personal history of nicotine dependence: Secondary | ICD-10-CM

## 2020-05-02 DIAGNOSIS — I480 Paroxysmal atrial fibrillation: Secondary | ICD-10-CM | POA: Diagnosis present

## 2020-05-02 DIAGNOSIS — Z841 Family history of disorders of kidney and ureter: Secondary | ICD-10-CM

## 2020-05-02 DIAGNOSIS — Z794 Long term (current) use of insulin: Secondary | ICD-10-CM | POA: Diagnosis not present

## 2020-05-02 DIAGNOSIS — I10 Essential (primary) hypertension: Secondary | ICD-10-CM | POA: Diagnosis not present

## 2020-05-02 DIAGNOSIS — I129 Hypertensive chronic kidney disease with stage 1 through stage 4 chronic kidney disease, or unspecified chronic kidney disease: Secondary | ICD-10-CM | POA: Diagnosis present

## 2020-05-02 DIAGNOSIS — F419 Anxiety disorder, unspecified: Secondary | ICD-10-CM | POA: Diagnosis present

## 2020-05-02 DIAGNOSIS — Z4682 Encounter for fitting and adjustment of non-vascular catheter: Secondary | ICD-10-CM | POA: Diagnosis not present

## 2020-05-02 DIAGNOSIS — H409 Unspecified glaucoma: Secondary | ICD-10-CM | POA: Diagnosis present

## 2020-05-02 DIAGNOSIS — E11319 Type 2 diabetes mellitus with unspecified diabetic retinopathy without macular edema: Secondary | ICD-10-CM | POA: Diagnosis present

## 2020-05-02 DIAGNOSIS — Z7952 Long term (current) use of systemic steroids: Secondary | ICD-10-CM

## 2020-05-02 DIAGNOSIS — E861 Hypovolemia: Secondary | ICD-10-CM | POA: Diagnosis present

## 2020-05-02 DIAGNOSIS — J9601 Acute respiratory failure with hypoxia: Secondary | ICD-10-CM | POA: Diagnosis not present

## 2020-05-02 DIAGNOSIS — N3090 Cystitis, unspecified without hematuria: Secondary | ICD-10-CM | POA: Diagnosis present

## 2020-05-02 DIAGNOSIS — Z833 Family history of diabetes mellitus: Secondary | ICD-10-CM

## 2020-05-02 DIAGNOSIS — E872 Acidosis: Secondary | ICD-10-CM | POA: Diagnosis present

## 2020-05-02 DIAGNOSIS — Z86718 Personal history of other venous thrombosis and embolism: Secondary | ICD-10-CM

## 2020-05-02 DIAGNOSIS — R4182 Altered mental status, unspecified: Secondary | ICD-10-CM | POA: Diagnosis not present

## 2020-05-02 DIAGNOSIS — M1990 Unspecified osteoarthritis, unspecified site: Secondary | ICD-10-CM | POA: Diagnosis present

## 2020-05-02 DIAGNOSIS — Z8249 Family history of ischemic heart disease and other diseases of the circulatory system: Secondary | ICD-10-CM

## 2020-05-02 DIAGNOSIS — Z9981 Dependence on supplemental oxygen: Secondary | ICD-10-CM

## 2020-05-02 DIAGNOSIS — Z880 Allergy status to penicillin: Secondary | ICD-10-CM | POA: Diagnosis not present

## 2020-05-02 DIAGNOSIS — Z20822 Contact with and (suspected) exposure to covid-19: Secondary | ICD-10-CM | POA: Diagnosis present

## 2020-05-02 DIAGNOSIS — Q612 Polycystic kidney, adult type: Secondary | ICD-10-CM | POA: Diagnosis not present

## 2020-05-02 DIAGNOSIS — Z85828 Personal history of other malignant neoplasm of skin: Secondary | ICD-10-CM

## 2020-05-02 DIAGNOSIS — T884XXA Failed or difficult intubation, initial encounter: Secondary | ICD-10-CM

## 2020-05-02 DIAGNOSIS — I351 Nonrheumatic aortic (valve) insufficiency: Secondary | ICD-10-CM

## 2020-05-02 DIAGNOSIS — I350 Nonrheumatic aortic (valve) stenosis: Secondary | ICD-10-CM

## 2020-05-02 DIAGNOSIS — F05 Delirium due to known physiological condition: Secondary | ICD-10-CM | POA: Diagnosis not present

## 2020-05-02 DIAGNOSIS — G92 Toxic encephalopathy: Secondary | ICD-10-CM | POA: Diagnosis not present

## 2020-05-02 DIAGNOSIS — K5730 Diverticulosis of large intestine without perforation or abscess without bleeding: Secondary | ICD-10-CM | POA: Diagnosis not present

## 2020-05-02 DIAGNOSIS — A419 Sepsis, unspecified organism: Secondary | ICD-10-CM | POA: Diagnosis not present

## 2020-05-02 DIAGNOSIS — F329 Major depressive disorder, single episode, unspecified: Secondary | ICD-10-CM | POA: Diagnosis present

## 2020-05-02 DIAGNOSIS — Z7984 Long term (current) use of oral hypoglycemic drugs: Secondary | ICD-10-CM

## 2020-05-02 DIAGNOSIS — E1122 Type 2 diabetes mellitus with diabetic chronic kidney disease: Secondary | ICD-10-CM | POA: Diagnosis present

## 2020-05-02 DIAGNOSIS — K769 Liver disease, unspecified: Secondary | ICD-10-CM | POA: Diagnosis present

## 2020-05-02 DIAGNOSIS — N179 Acute kidney failure, unspecified: Secondary | ICD-10-CM | POA: Diagnosis not present

## 2020-05-02 DIAGNOSIS — R58 Hemorrhage, not elsewhere classified: Secondary | ICD-10-CM | POA: Diagnosis not present

## 2020-05-02 DIAGNOSIS — Z83438 Family history of other disorder of lipoprotein metabolism and other lipidemia: Secondary | ICD-10-CM

## 2020-05-02 DIAGNOSIS — T8619 Other complication of kidney transplant: Secondary | ICD-10-CM | POA: Diagnosis present

## 2020-05-02 DIAGNOSIS — Z96641 Presence of right artificial hip joint: Secondary | ICD-10-CM | POA: Diagnosis present

## 2020-05-02 DIAGNOSIS — K2970 Gastritis, unspecified, without bleeding: Secondary | ICD-10-CM | POA: Diagnosis present

## 2020-05-02 DIAGNOSIS — Z888 Allergy status to other drugs, medicaments and biological substances status: Secondary | ICD-10-CM

## 2020-05-02 DIAGNOSIS — E871 Hypo-osmolality and hyponatremia: Secondary | ICD-10-CM | POA: Diagnosis present

## 2020-05-02 DIAGNOSIS — J9811 Atelectasis: Secondary | ICD-10-CM | POA: Diagnosis not present

## 2020-05-02 DIAGNOSIS — M5136 Other intervertebral disc degeneration, lumbar region: Secondary | ICD-10-CM | POA: Diagnosis present

## 2020-05-02 DIAGNOSIS — K219 Gastro-esophageal reflux disease without esophagitis: Secondary | ICD-10-CM | POA: Diagnosis present

## 2020-05-02 DIAGNOSIS — E669 Obesity, unspecified: Secondary | ICD-10-CM | POA: Diagnosis present

## 2020-05-02 DIAGNOSIS — I959 Hypotension, unspecified: Secondary | ICD-10-CM | POA: Diagnosis present

## 2020-05-02 DIAGNOSIS — M109 Gout, unspecified: Secondary | ICD-10-CM | POA: Diagnosis present

## 2020-05-02 DIAGNOSIS — E875 Hyperkalemia: Secondary | ICD-10-CM | POA: Diagnosis present

## 2020-05-02 DIAGNOSIS — E113293 Type 2 diabetes mellitus with mild nonproliferative diabetic retinopathy without macular edema, bilateral: Secondary | ICD-10-CM | POA: Diagnosis not present

## 2020-05-02 DIAGNOSIS — R0602 Shortness of breath: Secondary | ICD-10-CM | POA: Diagnosis not present

## 2020-05-02 DIAGNOSIS — Z8271 Family history of polycystic kidney: Secondary | ICD-10-CM

## 2020-05-02 DIAGNOSIS — N3001 Acute cystitis with hematuria: Secondary | ICD-10-CM | POA: Diagnosis not present

## 2020-05-02 DIAGNOSIS — R319 Hematuria, unspecified: Secondary | ICD-10-CM | POA: Diagnosis present

## 2020-05-02 DIAGNOSIS — Z86010 Personal history of colonic polyps: Secondary | ICD-10-CM

## 2020-05-02 DIAGNOSIS — I700 Atherosclerosis of aorta: Secondary | ICD-10-CM | POA: Diagnosis not present

## 2020-05-02 DIAGNOSIS — Z978 Presence of other specified devices: Secondary | ICD-10-CM

## 2020-05-02 DIAGNOSIS — E782 Mixed hyperlipidemia: Secondary | ICD-10-CM | POA: Diagnosis present

## 2020-05-02 DIAGNOSIS — K5790 Diverticulosis of intestine, part unspecified, without perforation or abscess without bleeding: Secondary | ICD-10-CM | POA: Diagnosis present

## 2020-05-02 DIAGNOSIS — Z6834 Body mass index (BMI) 34.0-34.9, adult: Secondary | ICD-10-CM | POA: Diagnosis present

## 2020-05-02 HISTORY — PX: TRANSTHORACIC ECHOCARDIOGRAM: SHX275

## 2020-05-02 LAB — POCT I-STAT EG7
Acid-base deficit: 7 mmol/L — ABNORMAL HIGH (ref 0.0–2.0)
Acid-base deficit: 7 mmol/L — ABNORMAL HIGH (ref 0.0–2.0)
Bicarbonate: 19.8 mmol/L — ABNORMAL LOW (ref 20.0–28.0)
Bicarbonate: 19.2 mmol/L — ABNORMAL LOW (ref 20.0–28.0)
Calcium, Ion: 1.23 mmol/L (ref 1.15–1.40)
Calcium, Ion: 1.09 mmol/L — ABNORMAL LOW (ref 1.15–1.40)
HCT: 33 % — ABNORMAL LOW (ref 39.0–52.0)
HCT: 31 % — ABNORMAL LOW (ref 39.0–52.0)
Hemoglobin: 11.2 g/dL — ABNORMAL LOW (ref 13.0–17.0)
Hemoglobin: 10.5 g/dL — ABNORMAL LOW (ref 13.0–17.0)
O2 Saturation: 31 %
O2 Saturation: 99 %
Potassium: 4.5 mmol/L (ref 3.5–5.1)
Potassium: 5.8 mmol/L — ABNORMAL HIGH (ref 3.5–5.1)
Sodium: 128 mmol/L — ABNORMAL LOW (ref 135–145)
Sodium: 126 mmol/L — ABNORMAL LOW (ref 135–145)
TCO2: 21 mmol/L — ABNORMAL LOW (ref 22–32)
TCO2: 20 mmol/L — ABNORMAL LOW (ref 22–32)
pCO2, Ven: 42.8 mmHg — ABNORMAL LOW (ref 44.0–60.0)
pCO2, Ven: 38.1 mmHg — ABNORMAL LOW (ref 44.0–60.0)
pH, Ven: 7.273 (ref 7.250–7.430)
pH, Ven: 7.31 (ref 7.250–7.430)
pO2, Ven: 22 mmHg — CL (ref 32.0–45.0)
pO2, Ven: 136 mmHg — ABNORMAL HIGH (ref 32.0–45.0)

## 2020-05-02 LAB — URINALYSIS, ROUTINE W REFLEX MICROSCOPIC
Bacteria, UA: NONE SEEN
Bilirubin Urine: NEGATIVE
Glucose, UA: NEGATIVE mg/dL
Ketones, ur: 5 mg/dL — AB
Nitrite: NEGATIVE
Protein, ur: 100 mg/dL — AB
RBC / HPF: >50 RBC/hpf — ABNORMAL HIGH (ref 0–5)
Specific Gravity, Urine: 1.014 (ref 1.005–1.030)
WBC, UA: >50 WBC/hpf — ABNORMAL HIGH (ref 0–5)
pH: 5 (ref 5.0–8.0)

## 2020-05-02 LAB — CBC
HCT: 36.3 % — ABNORMAL LOW (ref 39.0–52.0)
Hemoglobin: 11.5 g/dL — ABNORMAL LOW (ref 13.0–17.0)
MCH: 31.6 pg (ref 26.0–34.0)
MCHC: 31.7 g/dL (ref 30.0–36.0)
MCV: 99.7 fL (ref 80.0–100.0)
Platelets: 107 10*3/uL — ABNORMAL LOW (ref 150–400)
RBC: 3.64 MIL/uL — ABNORMAL LOW (ref 4.22–5.81)
RDW: 14.6 % (ref 11.5–15.5)
WBC: 8.7 10*3/uL (ref 4.0–10.5)
nRBC: 0 % (ref 0.0–0.2)

## 2020-05-02 LAB — PROTEIN / CREATININE RATIO, URINE
Creatinine, Urine: 117.66 mg/dL
Protein Creatinine Ratio: 0.78 mg/mg{Cre} — ABNORMAL HIGH (ref 0.00–0.15)
Total Protein, Urine: 92 mg/dL

## 2020-05-02 LAB — COMPREHENSIVE METABOLIC PANEL
ALT: 40 U/L (ref 0–44)
AST: 89 U/L — ABNORMAL HIGH (ref 15–41)
Albumin: 2.9 g/dL — ABNORMAL LOW (ref 3.5–5.0)
Alkaline Phosphatase: 69 U/L (ref 38–126)
Anion gap: 12 (ref 5–15)
BUN: 49 mg/dL — ABNORMAL HIGH (ref 8–23)
CO2: 18 mmol/L — ABNORMAL LOW (ref 22–32)
Calcium: 8.6 mg/dL — ABNORMAL LOW (ref 8.9–10.3)
Chloride: 98 mmol/L (ref 98–111)
Creatinine, Ser: 3.33 mg/dL — ABNORMAL HIGH (ref 0.61–1.24)
GFR calc Af Amer: 21 mL/min — ABNORMAL LOW (ref >60)
GFR calc non Af Amer: 18 mL/min — ABNORMAL LOW (ref >60)
Glucose, Bld: 62 mg/dL — ABNORMAL LOW (ref 70–99)
Potassium: 4.6 mmol/L (ref 3.5–5.1)
Sodium: 128 mmol/L — ABNORMAL LOW (ref 135–145)
Total Bilirubin: 1.5 mg/dL — ABNORMAL HIGH (ref 0.3–1.2)
Total Protein: 5.7 g/dL — ABNORMAL LOW (ref 6.5–8.1)

## 2020-05-02 LAB — CBG MONITORING, ED: Glucose-Capillary: 90 mg/dL (ref 70–99)

## 2020-05-02 LAB — APTT: aPTT: 46 seconds — ABNORMAL HIGH (ref 24–36)

## 2020-05-02 LAB — ECHOCARDIOGRAM COMPLETE
Height: 72 in
Weight: 4000 oz

## 2020-05-02 LAB — RESPIRATORY PANEL BY RT PCR (FLU A&B, COVID)
Influenza A by PCR: NEGATIVE
Influenza B by PCR: NEGATIVE
SARS Coronavirus 2 by RT PCR: NEGATIVE

## 2020-05-02 LAB — HEMOGLOBIN A1C
Hgb A1c MFr Bld: 5.9 % — ABNORMAL HIGH (ref 4.8–5.6)
Mean Plasma Glucose: 122.63 mg/dL

## 2020-05-02 LAB — SODIUM, URINE, RANDOM: Sodium, Ur: 12 mmol/L

## 2020-05-02 LAB — GLUCOSE, CAPILLARY: Glucose-Capillary: 141 mg/dL — ABNORMAL HIGH (ref 70–99)

## 2020-05-02 LAB — ETHANOL: Alcohol, Ethyl (B): <10 mg/dL (ref <10)

## 2020-05-02 LAB — CREATININE, URINE, RANDOM: Creatinine, Urine: 116.72 mg/dL

## 2020-05-02 LAB — MRSA PCR SCREENING: MRSA by PCR: NEGATIVE

## 2020-05-02 LAB — PROTIME-INR
INR: 1.1 (ref 0.8–1.2)
Prothrombin Time: 14.2 seconds (ref 11.4–15.2)

## 2020-05-02 LAB — LIPASE, BLOOD: Lipase: 171 U/L — ABNORMAL HIGH (ref 11–51)

## 2020-05-02 LAB — AMMONIA: Ammonia: 9 umol/L (ref 9–35)

## 2020-05-02 MED ORDER — LABETALOL HCL 5 MG/ML IV SOLN
10.0000 mg | INTRAVENOUS | Status: DC | PRN
  Administered 2020-05-04: 10 mg via INTRAVENOUS
  Filled 2020-05-02: qty 4

## 2020-05-02 MED ORDER — ACETAMINOPHEN 650 MG RE SUPP
650.0000 mg | Freq: Four times a day (QID) | RECTAL | Status: DC | PRN
  Filled 2020-05-02: qty 1

## 2020-05-02 MED ORDER — FOLIC ACID 1 MG PO TABS
1.0000 mg | ORAL_TABLET | Freq: Every day | ORAL | Status: DC
  Administered 2020-05-02 – 2020-05-04 (×3): 1 mg via ORAL
  Filled 2020-05-02 (×3): qty 1

## 2020-05-02 MED ORDER — LORAZEPAM 2 MG/ML IJ SOLN
1.0000 mg | INTRAMUSCULAR | Status: DC | PRN
  Administered 2020-05-04: 1 mg via INTRAVENOUS
  Administered 2020-05-05: 2 mg via INTRAVENOUS
  Filled 2020-05-02 (×3): qty 1

## 2020-05-02 MED ORDER — HEPARIN SODIUM (PORCINE) 5000 UNIT/ML IJ SOLN
5000.0000 [IU] | Freq: Three times a day (TID) | INTRAMUSCULAR | Status: DC
  Administered 2020-05-02 – 2020-05-04 (×6): 5000 [IU] via SUBCUTANEOUS
  Filled 2020-05-02 (×6): qty 1

## 2020-05-02 MED ORDER — MYCOPHENOLATE MOFETIL 250 MG PO CAPS
250.0000 mg | ORAL_CAPSULE | Freq: Two times a day (BID) | ORAL | Status: DC
  Administered 2020-05-02 – 2020-05-04 (×5): 250 mg via ORAL
  Filled 2020-05-02 (×7): qty 1

## 2020-05-02 MED ORDER — SODIUM CHLORIDE 0.9% FLUSH: 3.0000 mL | Freq: Once | INTRAVENOUS | Status: DC

## 2020-05-02 MED ORDER — LACTATED RINGERS IV SOLN: INTRAVENOUS | Status: DC

## 2020-05-02 MED ORDER — ALLOPURINOL 100 MG PO TABS
200.0000 mg | ORAL_TABLET | Freq: Every day | ORAL | Status: DC
  Administered 2020-05-02 – 2020-05-04 (×3): 200 mg via ORAL
  Filled 2020-05-02 (×5): qty 2

## 2020-05-02 MED ORDER — PANTOPRAZOLE SODIUM 20 MG PO TBEC
20.0000 mg | DELAYED_RELEASE_TABLET | Freq: Every morning | ORAL | Status: DC
  Administered 2020-05-03 – 2020-05-04 (×2): 20 mg via ORAL
  Filled 2020-05-02 (×3): qty 1

## 2020-05-02 MED ORDER — IPRATROPIUM-ALBUTEROL 0.5-2.5 (3) MG/3ML IN SOLN: 3.0000 mL | Freq: Four times a day (QID) | RESPIRATORY_TRACT | Status: DC | PRN

## 2020-05-02 MED ORDER — THIAMINE HCL 100 MG PO TABS
100.0000 mg | ORAL_TABLET | Freq: Every day | ORAL | Status: DC
  Administered 2020-05-02 – 2020-05-03 (×2): 100 mg via ORAL
  Filled 2020-05-02 (×3): qty 1

## 2020-05-02 MED ORDER — INSULIN ASPART 100 UNIT/ML ~~LOC~~ SOLN
0.0000 [IU] | Freq: Three times a day (TID) | SUBCUTANEOUS | Status: DC
  Administered 2020-05-03 (×2): 1 [IU] via SUBCUTANEOUS
  Administered 2020-05-03 – 2020-05-04 (×4): 2 [IU] via SUBCUTANEOUS

## 2020-05-02 MED ORDER — THIAMINE HCL 100 MG/ML IJ SOLN
100.0000 mg | Freq: Every day | INTRAMUSCULAR | Status: DC
  Administered 2020-05-04: 100 mg via INTRAVENOUS
  Filled 2020-05-02: qty 2

## 2020-05-02 MED ORDER — HYDROCORTISONE NA SUCCINATE PF 100 MG IJ SOLR
50.0000 mg | Freq: Four times a day (QID) | INTRAMUSCULAR | Status: DC
  Administered 2020-05-02 – 2020-05-04 (×7): 50 mg via INTRAVENOUS
  Filled 2020-05-02 (×7): qty 2

## 2020-05-02 MED ORDER — LORAZEPAM 1 MG PO TABS: 1.0000 mg | ORAL_TABLET | ORAL | Status: DC | PRN

## 2020-05-02 MED ORDER — FLUOXETINE HCL 20 MG PO CAPS
40.0000 mg | ORAL_CAPSULE | Freq: Every day | ORAL | Status: DC
  Administered 2020-05-03 – 2020-05-04 (×2): 40 mg via ORAL
  Filled 2020-05-02 (×2): qty 2

## 2020-05-02 MED ORDER — SODIUM CHLORIDE 0.9 % IV BOLUS
1000.0000 mL | Freq: Once | INTRAVENOUS | Status: AC
  Administered 2020-05-02: 1000 mL via INTRAVENOUS

## 2020-05-02 MED ORDER — ALPRAZOLAM 0.25 MG PO TABS
0.2500 mg | ORAL_TABLET | Freq: Every day | ORAL | Status: DC | PRN
  Administered 2020-05-03 – 2020-05-04 (×3): 0.25 mg via ORAL
  Filled 2020-05-02 (×3): qty 1

## 2020-05-02 MED ORDER — SODIUM CHLORIDE 0.9 % IV SOLN
1.0000 g | INTRAVENOUS | Status: AC
  Administered 2020-05-03 – 2020-05-07 (×5): 1 g via INTRAVENOUS
  Filled 2020-05-02: qty 1
  Filled 2020-05-02: qty 10
  Filled 2020-05-02: qty 1
  Filled 2020-05-02 (×3): qty 10
  Filled 2020-05-02: qty 1

## 2020-05-02 MED ORDER — TACROLIMUS 0.5 MG PO CAPS
0.5000 mg | ORAL_CAPSULE | Freq: Two times a day (BID) | ORAL | Status: DC
  Administered 2020-05-02 – 2020-05-03 (×3): 0.5 mg via ORAL
  Filled 2020-05-02 (×4): qty 1

## 2020-05-02 MED ORDER — TACROLIMUS 1 MG PO CAPS
1.0000 mg | ORAL_CAPSULE | Freq: Two times a day (BID) | ORAL | Status: DC
  Administered 2020-05-02 – 2020-05-03 (×3): 1 mg via ORAL
  Filled 2020-05-02 (×4): qty 1

## 2020-05-02 MED ORDER — ROSUVASTATIN CALCIUM 20 MG PO TABS
20.0000 mg | ORAL_TABLET | Freq: Every day | ORAL | Status: DC
  Administered 2020-05-02 – 2020-05-04 (×3): 20 mg via ORAL
  Filled 2020-05-02 (×2): qty 1
  Filled 2020-05-02: qty 4

## 2020-05-02 MED ORDER — IPRATROPIUM-ALBUTEROL 0.5-2.5 (3) MG/3ML IN SOLN
3.0000 mL | Freq: Four times a day (QID) | RESPIRATORY_TRACT | Status: DC
  Administered 2020-05-02: 3 mL via RESPIRATORY_TRACT
  Filled 2020-05-02: qty 3

## 2020-05-02 MED ORDER — ADULT MULTIVITAMIN W/MINERALS CH
1.0000 | ORAL_TABLET | Freq: Every day | ORAL | Status: DC
  Administered 2020-05-02 – 2020-05-04 (×3): 1 via ORAL
  Filled 2020-05-02 (×3): qty 1

## 2020-05-02 MED ORDER — ACETAMINOPHEN 325 MG PO TABS
650.0000 mg | ORAL_TABLET | Freq: Four times a day (QID) | ORAL | Status: DC | PRN
  Administered 2020-05-03: 650 mg via ORAL
  Filled 2020-05-02 (×2): qty 2

## 2020-05-02 MED ORDER — SODIUM CHLORIDE 0.9 % IV SOLN
1.0000 g | Freq: Once | INTRAVENOUS | Status: AC
  Administered 2020-05-02: 1 g via INTRAVENOUS
  Filled 2020-05-02: qty 10

## 2020-05-02 MED ORDER — BUDESONIDE 0.25 MG/2ML IN SUSP
0.2500 mg | Freq: Two times a day (BID) | RESPIRATORY_TRACT | Status: DC
  Administered 2020-05-03 – 2020-05-05 (×6): 0.25 mg via RESPIRATORY_TRACT
  Filled 2020-05-02 (×9): qty 2

## 2020-05-02 NOTE — Consult Note (Signed)
James Zimmerman Admit Date: 05/02/2020 05/02/2020 Rexene Agent Requesting Physician:  Sedonia Small MD  Reason for Consult:  AKI on CKD in KT pt, ?UTI HPI:  69M presented to [facility] ED earlier today for N/V, poor PO, and recent treatment of UTI.  He was admitted to [place] [facility] earlier this week.   He originally presented with fevers, dysuria, N/V, poor PO.  This history is provided by the wife and patient.  Records from previous hospital are not available.  Upon initial presentation it was felt that he had a UTI, he was started on antibiotics, unknown spefics, and given IVF.  Wife tells me that presenting creatinine was around 2.0 and improved to 1.7 the next day.  At some point antibiotics were changed, and he was told that he did have an infection in the bladder.  There were several frustrations and loss of confidence in the care he received in the left AMA, yesterday, and has presented here to the ED today.  The patient did receive a CAT scan.  It is unclear if IV contrast was used, but the patient thinks if possible.  Antibiotics were not continued at discharge.  Since then he has been taking in very little by mouth.  Continue to have significant nausea.  No significant fevers.  No tenderness over the left iliac fossa where his allograft is.  They think that his immunosuppression was continued throughout his stay at the hospital, and no identified gaps in therapy have been identified.  The reason use of NSAIDs.  Work-up in the emergency room here has identified a creatinine of 3.3, sodium of 128.  Has a bicarbonate of 18 with an anion gap of 12.  CT of the abdomen pelvis demonstrates established findings consistent with PKD and thickening of the bladder wall suggestive of cystitis.  Follow-up transplant ultrasound in the left lower quadrant shows a normal-appearing kidney at 12.5 cm with no evidence of obstruction.  Here urinalysis confirms large amounts of erythrocytes and  leukocytes, no bacteria present on microscopy.  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  ADPKD, native kidneys present  OA s/p R THR  HTN on MTP   Creatinine  Date Value  02/27/2020 1.8 (A)  11/04/2019 1.9 (A)  11/01/2018 1.9 (A)  07/08/2018 1.7 (A)  03/04/2018 1.8 (A)  01/16/2017 1.4 mg/dL (A)   Creatinine, Ser  Date Value  05/02/2020 3.33 mg/dL (H)  06/30/2019 1.86 mg/dL (H)  05/17/2018 1.57 mg/dL (H)  02/18/2018 1.85  02/09/2017 1.65 mg/dL (H)  08/03/2016 1.39 mg/dL  05/03/2016 1.51 mg/dL (H)  12/12/2012 1.74 mg/dL (H)  12/11/2012 1.64 mg/dL (H)  12/04/2012 1.35 mg/dL  07/27/2012 1.58 mg/dL (H)  ] ROS NSAIDS: Denies use IV Contrast, as above, unclear if received CT with contrast at outside hospital TMP/SMX unknown antibiotics received previously Hypotension not present currently Balance of 12 systems is negative w/ exceptions as above  PMH  Past Medical History:  Diagnosis Date  . Anxiety and depression    crowds, closed in spaces, used to sleep, prn  . Chronic renal insufficiency, stage III (moderate)    polycystic kidney dz. (followed by Dr. Patel/Nephrology)--kidney transplant 2004.  Cr baseline as of 02/2020 is 1.6-1.9 (  GFR 35-61m./min).  . DDD (degenerative disc disease), lumbar   . Deep vein thrombosis (DVT) of left upper extremity (HCC)    due to PICC line  . Diabetes mellitus with complication (HCC)    Mild non-proliferative diab retinopathy OS.  Hba1c at nephrologist's 02/18/18 was 5.8%. A1c at nephrol 02/2020 is 5.7%.  . Diverticulosis    + hx of 'itis  . Gastritis 01/2017   likely due to alcohol  . GERD (gastroesophageal reflux disease)   . Glaucoma    severe  . Gout   . H/O nonmelanoma skin cancer    SCC (chest, face,  head)--s/p Mohs 2009.  BCC-s/p Mohs 2021  . H/O polycystic kidney disease   . History of depression   . History of kidney transplant    2004   . History of retinal detachment    Dr. Baird Cancer  . Hx of adenomatous colonic polyps 02/2012; 05/2014; 08/2017   2013 and 2015 tubular adenomas (Dr. Oletta Lamas). 08/2017 NO POLYPS->recall 5 yrs.  . Hyperlipemia, mixed    Lipids at goal 02/2020  . Hypertension   . Osteoarthritis    s/p R THR   PSH  Past Surgical History:  Procedure Laterality Date  . COLONOSCOPY  06/10/14; 09/05/17   2015: Tubular adenoma x 4: recall 3 yrs (Dr. Alferd Apa [facility]). 08/2017 NO POLYPS. Recall 5 yrs.  . COLONOSCOPY  03/19/2012   tubular adenoma x 1; diverticulosis, int hem.Procedure: COLONOSCOPY;  Surgeon: Winfield Cunas., MD;  Location: [facility] ENDOSCOPY;  Service: Endoscopy;  Laterality: N/A;  . HOT HEMOSTASIS  03/19/2012   Procedure: HOT HEMOSTASIS (ARGON PLASMA COAGULATION/BICAP);  Surgeon: Winfield Cunas., MD;  Location: [facility] ENDOSCOPY;  Service: Endoscopy;  Laterality: N/A;  . KIDNEY TRANSPLANT  2004  . NOSE SURGERY  09/03/2019   skin cancer  . TOTAL HIP ARTHROPLASTY  12/10/2012   Procedure: TOTAL HIP ARTHROPLASTY ANTERIOR APPROACH;  Surgeon: Mauri Pole, MD;  Location: [facility] ORS;  Service: Orthopedics;  Laterality: Right;   FH  Family History  Problem Relation Age of Onset  . Diabetes Mother   . Heart attack Mother   . Hyperlipidemia Father   . Kidney disease Father   . Hypertension Father   . Kidney disease Brother   . Heart disease Brother   . Diabetes Maternal Grandmother   . Kidney disease Paternal Grandfather   . Polycystic kidney disease Other   . Anesthesia problems Neg Hx   . Hypotension Neg Hx   . Malignant hyperthermia Neg Hx   . Pseudochol deficiency Neg Hx    SH  reports that he quit smoking about 36 years ago. His smoking use included cigarettes. He has a 60.00 pack-year smoking history. He has never used smokeless tobacco. He reports current  alcohol use of about 28.0 standard drinks of alcohol per week. He reports that he does not use drugs. Allergies  Allergies  Allergen Reactions  . Lipitor [Atorvastatin] Other (See Comments)    Headaches  . Zocor [Simvastatin] Other (See Comments)    Myalgia  . Penicillins Rash   Home medications Prior to Admission medications   Medication Sig Start Date End Date Taking? Authorizing Provider  ACCU-CHEK SMARTVIEW test strip USE TO TEST BLOOD SUGAR EVERY DAY Patient taking differently: 1 each by Other route daily.  05/30/19  Yes McGowen, Adrian Blackwater, MD  allopurinol (ZYLOPRIM) 100 MG tablet Take 200 mg by mouth daily.    Yes [provider]  ALPRAZolam Duanne Moron) 0.25 MG tablet  Take 1 tablet (0.25 mg total) by mouth daily as needed. Patient taking differently: Take 0.25 mg by mouth daily as needed for anxiety or sleep.  03/05/20  Yes McGowen, Adrian Blackwater, MD  amLODipine (NORVASC) 5 MG tablet Take 5 mg by mouth daily.   Yes [provider]  FLUoxetine (PROZAC) 40 MG capsule Take 40 mg by mouth daily. Patient takes in am   Yes [provider]  glipiZIDE (GLUCOTROL) 5 MG tablet Take 5 mg by mouth See admin instructions. Take 10mg  in the morning, and 5mg  in the evening.   Yes [provider]  MAGNESIUM-OXIDE 400 (241.3 Mg) MG tablet Take 400 mg by mouth daily.  08/30/18  Yes [provider]  metoprolol tartrate (LOPRESSOR) 50 MG tablet Take 1 tablet (50 mg total) by mouth 2 (two) times daily. 04/24/18  Yes McGowen, Adrian Blackwater, MD  mycophenolate (CELLCEPT) 250 MG capsule Take 250 mg by mouth 2 (two) times daily.    Yes [provider]  pantoprazole (PROTONIX) 20 MG tablet Take 20 mg by mouth every morning.  01/29/20  Yes [provider]  pioglitazone (ACTOS) 45 MG tablet Take 1 tablet (45 mg total) by mouth daily. 03/08/20  Yes McGowen, Adrian Blackwater, MD  predniSONE (DELTASONE) 5 MG tablet Take 5 mg by mouth daily.   Yes [provider]  rosuvastatin  (CRESTOR) 20 MG tablet TAKE 1 TABLET BY MOUTH EVERY DAY Patient taking differently: Take 20 mg by mouth daily.  07/23/19  Yes McGowen, Adrian Blackwater, MD  tacrolimus (PROGRAF) 0.5 MG capsule Take 1 capsule by mouth 2 (two) times daily. 01/19/16  Yes [provider]  tacrolimus (PROGRAF) 1 MG capsule Take 1 mg by mouth 2 (two) times daily.    Yes [provider]  ZIOPTAN 0.0015 % SOLN Place 1 drop into both eyes at bedtime. 09/25/17  Yes [provider]    Current Medications Scheduled Meds: . allopurinol  200 mg Oral Daily  . budesonide (PULMICORT) nebulizer solution  0.25 mg Nebulization BID  . FLUoxetine  40 mg Oral Daily  . folic acid  1 mg Oral Daily  . heparin  5,000 Units Subcutaneous Q8H  . hydrocortisone sod succinate (SOLU-CORTEF) inj  50 mg Intravenous Q6H  . insulin aspart  0-6 Units Subcutaneous TID WC  . ipratropium-albuterol  3 mL Nebulization Q6H  . multivitamin with minerals  1 tablet Oral Daily  . mycophenolate  250 mg Oral BID  . [START ON 05/03/2020] pantoprazole  20 mg Oral q morning - 10a  . rosuvastatin  20 mg Oral Daily  . sodium chloride flush  3 mL Intravenous Once  . tacrolimus  0.5 mg Oral BID  . tacrolimus  1 mg Oral BID  . thiamine  100 mg Oral Daily   Or  . thiamine  100 mg Intravenous Daily   Continuous Infusions: . cefTRIAXone (ROCEPHIN)  IV 1 g (05/02/20 1540)  . [START ON 05/03/2020] cefTRIAXone (ROCEPHIN)  IV     PRN Meds:.acetaminophen **OR** acetaminophen, ALPRAZolam, ipratropium-albuterol, LORazepam **OR** LORazepam  CBC Recent Labs  Lab 05/02/20 1207 05/02/20 1251  WBC 8.7  --   HGB 11.5* 11.2*  HCT 36.3* 33.0*  MCV 99.7  --   PLT 107*  --    Basic Metabolic Panel Recent Labs  Lab 05/02/20 1207 05/02/20 1251  NA 128* 128*  K 4.6 4.5  CL 98  --   CO2 18*  --   GLUCOSE 62*  --  BUN 49*  --   CREATININE 3.33*  --   CALCIUM 8.6*  --     Physical Exam  Blood pressure 109/64, pulse 73, temperature 97.7 F  (36.5 C), temperature source Oral, resp. rate (!) 29, height 6' (1.829 m), weight 113.4 kg, SpO2 96 %. GEN: NAD, conversant and cooperative ENT: NCAT EYES: EOMI CV: Regular, normal S1 and S2 PULM: Clear bilaterally, no crackles or wheezing, normal work of breathing ABD: Soft, nontender, no tenderness over left lower quadrant SKIN: Several large traumatic ecchymoses present in limbs, none on the trunk EXT: No peripheral edema  Assessment 38M with 69yo LURD KT on Tac/MMF/Pred BL SCr 1.6-1.9 with AKI SCR 3.3 and recent treatment of UTI at OSH, stil with pyuria/hematuria.  Unusual story we certainly need outside records to fill in the gaps.  I have reached out to what I believe is one of the hospitalist, a phone number provided by the wife, and also have tried to connect with nephrology at [facility]; neither which has been successful yet.  It appears that his renal function has worsened even from when he was at the hospital, I am not sure if this is from hypovolemia.  He still has pyuria and hematuria after what I suspect would be appropriate antibiotics for UTI.  It would be unusual for him to have rejection in the 69 year old kidney while he has been receiving immunosuppression; but we need to keep this in the differential.  At this point agree with hydration, IV antibiotics, follow-up culture data, and trend creatinine carefully.  No clinical symptoms to suggest cyst infection.  No imaging findings consistent with pyelonephritis of the transplanted kidney.  1. AoCKD3 2. Pyuria/Hematuria, recent UTI 3. ADPKD 4. ETOH use, sig 5. Hyponatremia, mild, ASx  Plan 1. LR @ 125/h overniht 2. Agree with ceftriaxone 3. Hopefully I can connect with nephrology hospitalist from [facility]; we should make a formal request for records from the OSH 4. Continue current immunosuppression 5. Trend SNa with hydration 6. Monitor for withdrawal from ETOH 7. Daily weights, Daily Renal  Panel, Strict I/Os, Avoid nephrotoxins (NSAIDs, judicious IV Contrast)    Rexene Agent  [id number] pgr 05/02/2020, 3:52 PM

## 2020-05-02 NOTE — Progress Notes (Signed)
  Echocardiogram 2D Echocardiogram has been performed.  James Zimmerman 05/02/2020, 4:50 PM

## 2020-05-02 NOTE — H&P (Signed)
History and Physical    James Zimmerman [id number] DOB: [birth year] DOA: 05/02/2020  PCP: [name], MD   Patient coming from: Home  I have personally briefly reviewed patient's old medical records in [place]  Chief Complaint: Feeling sick, dysuria  HPI: James Zimmerman is a 69 y.o. male with medical history significant of CKD, polycystic kidney dz s/p kidney transplant in 2004 on maintenance immune modulation meds, IIDM, Gout, EtOH abuse, present with worsening of urinary symptoms including dysuria and hematuria.  Patient also has episodic confusion and agitation, as well as increasing short of breath.  Symptoms started about 4 days ago while on a trip to [facility], patient started to have urinary frequency and burning sensations low-grade fever and went to local hospital [facility], when patient was diagnosed with UTI.  Antibiotics was started in the ED, and according patient wife there was image study done in the ED showed normal transplant kidney.  However the next day patient spiked fever 104, and mental status was patient became confused with episodic agitation, antibiotics was switched, without significant improvement in last 2 days.  Patient eventually signed AMA and came to ER today.  Patient also described feeling nauseous and significant decrease of appetite over the last 3 days. ED Course: Patient was found to have severe dehydration with borderline hypotension, sodium 128, bicarb 18, glucose 62, BUN 49 creatinine 2.3 compared to 1.8 2 months ago.  WBC 8.7, hemoglobin 11.5.  CT abdomen showed no significant acute changes in her transplanted kidney.  Kidney Doppler within normal limits.  Review of Systems: As per HPI otherwise 10 point review of systems negative.    Past Medical History:  Diagnosis Date  . Anxiety and depression    crowds, closed in spaces, used to sleep, prn  . Chronic renal insufficiency, stage III (moderate)    polycystic  kidney dz. (followed by Dr. Patel/Nephrology)--kidney transplant 2004.  Cr baseline as of 02/2020 is 1.6-1.9 (GFR 35-45m./min).  . DDD (degenerative disc disease), lumbar   . Deep vein thrombosis (DVT) of left upper extremity (HCC)    due to PICC line  . Diabetes mellitus with complication (HCC)    Mild non-proliferative diab retinopathy OS.  Hba1c at nephrologist's 02/18/18 was 5.8%. A1c at nephrol 02/2020 is 5.7%.  . Diverticulosis    + hx of 'itis  . Gastritis 01/2017   likely due to alcohol  . GERD (gastroesophageal reflux disease)   . Glaucoma    severe  . Gout   . H/O nonmelanoma skin cancer    SCC (chest, face, head)--s/p Mohs 2009.  BCC-s/p Mohs 2021  . H/O polycystic kidney disease   . History of depression   . History of kidney transplant    2004   . History of retinal detachment    Dr. Baird [facility]  . Hx of adenomatous colonic polyps 02/2012; 05/2014; 08/2017   2013 and 2015 tubular adenomas (Dr. Oletta Lamas). 08/2017 NO POLYPS->recall 5 yrs.  . Hyperlipemia, mixed    Lipids at goal 02/2020  . Hypertension   . Osteoarthritis    s/p R THR    Past Surgical History:  Procedure Laterality Date  . COLONOSCOPY  06/10/14; 09/05/17   2015: Tubular adenoma x 4: recall 3 yrs (Dr. Alferd Apa [facility]). 08/2017 NO POLYPS. Recall 5 yrs.  . COLONOSCOPY  03/19/2012   tubular adenoma x 1; diverticulosis, int hem.Procedure: COLONOSCOPY;  Surgeon: Winfield Cunas., MD;  Location: [facility] ENDOSCOPY;  Service: Endoscopy;  Laterality: N/A;  . HOT HEMOSTASIS  03/19/2012   Procedure: HOT HEMOSTASIS (ARGON PLASMA COAGULATION/BICAP);  Surgeon: Winfield Cunas., MD;  Location: [facility] ENDOSCOPY;  Service: Endoscopy;  Laterality: N/A;  . KIDNEY TRANSPLANT  2004  . NOSE SURGERY  09/03/2019   skin cancer  . TOTAL HIP ARTHROPLASTY  12/10/2012   Procedure: TOTAL HIP ARTHROPLASTY ANTERIOR APPROACH;  Surgeon: Mauri Pole, MD;  Location: [facility] ORS;  Service: Orthopedics;  Laterality: Right;     reports that he quit  smoking about 36 years ago. His smoking use included cigarettes. He has a 60.00 pack-year smoking history. He has never used smokeless tobacco. He reports current alcohol use of about 28.0 standard drinks of alcohol per week. He reports that he does not use drugs.  Allergies  Allergen Reactions  . Lipitor [Atorvastatin] Other (See Comments)    Headaches  . Zocor [Simvastatin] Other (See Comments)    Myalgia  . Penicillins Rash    Family History  Problem Relation Age of Onset  . Diabetes Mother   . Heart attack Mother   . Hyperlipidemia Father   . Kidney disease Father   . Hypertension Father   . Kidney disease Brother   . Heart disease Brother   . Diabetes Maternal Grandmother   . Kidney disease Paternal Grandfather   . Polycystic kidney disease Other   . Anesthesia problems Neg Hx   . Hypotension Neg Hx   . Malignant hyperthermia Neg Hx   . Pseudochol deficiency Neg Hx       Prior to Admission medications   Medication Sig Start Date End Date Taking? Authorizing Provider  ACCU-CHEK SMARTVIEW test strip USE TO TEST BLOOD SUGAR EVERY DAY Patient taking differently: 1 each by Other route daily.  05/30/19  Yes McGowen, Adrian Blackwater, MD  allopurinol (ZYLOPRIM) 100 MG tablet Take 200 mg by mouth daily.    Yes [provider]  ALPRAZolam (XANAX) 0.25 MG tablet Take 1 tablet (0.25 mg total) by mouth daily as needed. Patient taking differently: Take 0.25 mg by mouth daily as needed for anxiety or sleep.  03/05/20  Yes McGowen, Adrian Blackwater, MD  amLODipine (NORVASC) 5 MG tablet Take 5 mg by mouth daily.   Yes [provider]  FLUoxetine (PROZAC) 40 MG capsule Take 40 mg by mouth daily. Patient takes in am   Yes [provider]  glipiZIDE (GLUCOTROL) 5 MG tablet Take 5 mg by mouth See admin instructions. Take 10mg  in the morning, and 5mg  in the evening.   Yes [provider]  MAGNESIUM-OXIDE 400 (241.3 Mg) MG tablet Take 400 mg by mouth daily.  08/30/18  Yes  [provider]  metoprolol tartrate (LOPRESSOR) 50 MG tablet Take 1 tablet (50 mg total) by mouth 2 (two) times daily. 04/24/18  Yes McGowen, Adrian Blackwater, MD  mycophenolate (CELLCEPT) 250 MG capsule Take 250 mg by mouth 2 (two) times daily.    Yes [provider]  pantoprazole (PROTONIX) 20 MG tablet Take 20 mg by mouth every morning.  01/29/20  Yes [provider]  pioglitazone (ACTOS) 45 MG tablet Take 1 tablet (45 mg total) by mouth daily. 03/08/20  Yes McGowen, Adrian Blackwater, MD  predniSONE (DELTASONE) 5 MG tablet Take 5 mg by mouth daily.   Yes [provider]  rosuvastatin (CRESTOR) 20 MG tablet TAKE 1 TABLET BY MOUTH EVERY DAY Patient taking differently: Take 20 mg by mouth daily.  07/23/19  Yes McGowen, Adrian Blackwater, MD  tacrolimus (PROGRAF) 0.5 MG capsule Take 1 capsule by mouth 2 (two) times daily. 01/19/16  Yes [provider]  tacrolimus (PROGRAF) 1 MG capsule Take 1 mg by mouth 2 (two) times daily.    Yes [provider]  ZIOPTAN 0.0015 % SOLN Place 1 drop into both eyes at bedtime. 09/25/17  Yes [provider]    Physical Exam: Vitals:   05/02/20 1300 05/02/20 1315 05/02/20 1330 05/02/20 1345  BP: 122/74 115/73 123/67 109/64  Pulse: 87 83 82 73  Resp: (!) 21 (!) 24 (!) 24 (!) 29  Temp:      TempSrc:      SpO2: 97% 96% 96% 96%  Weight:      Height:        Constitutional: NAD, calm, comfortable Vitals:   05/02/20 1300 05/02/20 1315 05/02/20 1330 05/02/20 1345  BP: 122/74 115/73 123/67 109/64  Pulse: 87 83 82 73  Resp: (!) 21 (!) 24 (!) 24 (!) 29  Temp:      TempSrc:      SpO2: 97% 96% 96% 96%  Weight:      Height:       Eyes: PERRL, lids and conjunctivae normal ENMT: Mucous membranes are dry. Posterior pharynx clear of any exudate or lesions.Normal dentition.  Neck: normal, supple, no masses, no thyromegaly Respiratory: clear to auscultation bilaterally, diffused wheezing, no crackles. Normal respiratory effort. No  accessory muscle use.  Cardiovascular: Regular rate and rhythm, no murmurs / rubs / gallops.  1 Plus extremity edema. 2+ pedal pulses. No carotid bruits.  Abdomen: no tenderness, no masses palpated. No hepatosplenomegaly. Bowel sounds positive.  Musculoskeletal: no clubbing / cyanosis. No joint deformity upper and lower extremities. Good ROM, no contractures. Normal muscle tone.  Skin: no rashes, lesions, ulcers. No induration Neurologic: Moving all limbs following simple commands.  Psychiatric: Sleepy.     Labs on Admission: I have personally reviewed following labs and imaging studies  CBC: Recent Labs  Lab 05/02/20 1207 05/02/20 1251  WBC 8.7  --   HGB 11.5* 11.2*  HCT 36.3* 33.0*  MCV 99.7  --   PLT 107*  --    Basic Metabolic Panel: Recent Labs  Lab 05/02/20 1207 05/02/20 1251  NA 128* 128*  K 4.6 4.5  CL 98  --   CO2 18*  --   GLUCOSE 62*  --   BUN 49*  --   CREATININE 3.33*  --   CALCIUM 8.6*  --    GFR: Estimated Creatinine Clearance: 27.2 mL/min (A) (by C-G formula based on SCr of 3.33 mg/dL (H)). Liver Function Tests: Recent Labs  Lab 05/02/20 1207  AST 89*  ALT 40  ALKPHOS 69  BILITOT 1.5*  PROT 5.7*  ALBUMIN 2.9*   Recent Labs  Lab 05/02/20 1207  LIPASE 171*   Recent Labs  Lab 05/02/20 1244  AMMONIA 9   Coagulation Profile: Recent Labs  Lab 05/02/20 1207  INR 1.1   Cardiac Enzymes: No results for input(s): CKTOTAL, CKMB, CKMBINDEX, TROPONINI in the last 168 hours. BNP (last 3 results) No results for input(s): PROBNP in the last 8760 hours. HbA1C: No results for input(s): HGBA1C in the last 72 hours. CBG: No results for input(s): GLUCAP in the last 168 hours. Lipid Profile: No results for input(s): CHOL, HDL, LDLCALC, TRIG, CHOLHDL, LDLDIRECT in the last 72 hours. Thyroid Function Tests: No results for input(s): TSH, T4TOTAL, FREET4, T3FREE, THYROIDAB in the last 72 hours. Anemia Panel: No results  for input(s): VITAMINB12,  FOLATE, FERRITIN, TIBC, IRON, RETICCTPCT in the last 72 hours. Urine analysis:    Component Value Date/Time   COLORURINE AMBER (A) 05/02/2020 1244   APPEARANCEUR CLOUDY (A) 05/02/2020 1244   LABSPEC 1.014 05/02/2020 1244   PHURINE 5.0 05/02/2020 [place] 05/02/2020 1244   GLUCOSEU NEGATIVE 06/23/2019 0920   HGBUR LARGE (A) 05/02/2020 1244   BILIRUBINUR NEGATIVE 05/02/2020 1244   KETONESUR 5 (A) 05/02/2020 1244   PROTEINUR 100 (A) 05/02/2020 1244   UROBILINOGEN 0.2 06/23/2019 0920   NITRITE NEGATIVE 05/02/2020 1244   LEUKOCYTESUR SMALL (A) 05/02/2020 1244    Radiological Exams on Admission: CT ABDOMEN PELVIS WO CONTRAST  Result Date: 05/02/2020 CLINICAL DATA:  Abdominal pain, nausea, diarrhea. Urinary tract infection. Renal transplant patient. EXAM: CT ABDOMEN AND PELVIS WITHOUT CONTRAST TECHNIQUE: Multidetector CT imaging of the abdomen and pelvis was performed following the standard protocol without IV contrast. COMPARISON:  None. FINDINGS: Lower chest: No acute findings. Hepatobiliary: A 2.6 cm low-attenuation lesion is seen in the left hepatic lobe, and a 1.6 cm lesion is seen in the right hepatic lobe. These cannot be characterized on this unenhanced exam. Gallbladder is unremarkable. No evidence of biliary ductal dilatation. Pancreas: No mass or inflammatory process visualized on this unenhanced exam. Spleen:  Within normal limits in size. Adrenals/Urinary tract: Normal adrenal glands. The native kidneys are markedly enlarged and diffusely involved by numerous cysts, consistent with autosomal dominant polycystic kidney disease. No evidence of ureteral calculi or hydronephrosis. Renal transplant is seen in the left iliac fossa and is unremarkable in appearance. No evidence of hydronephrosis. Urinary bladder shows mild diffuse wall thickening, and cystitis cannot be excluded. Stomach/Bowel: No evidence of obstruction, inflammatory process, or abnormal fluid collections. Normal  appendix visualized. Diverticulosis is seen mainly involving the sigmoid colon, however there is no evidence of diverticulitis. Vascular/Lymphatic: No pathologically enlarged lymph nodes identified. No evidence of abdominal aortic aneurysm. Aortic atherosclerosis noted. Reproductive:  No mass or other significant abnormality. Other:  None. Musculoskeletal: No suspicious bone lesions identified. Right hip prosthesis noted. IMPRESSION: 1. Normal appearance of renal transplant on this unenhanced exam. No evidence of hydronephrosis. 2. 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 3. Mild diffuse wall thickening of urinary bladder, which may be due to cystitis. Suggest correlation with urinalysis. 4. Colonic diverticulosis, without radiographic evidence of diverticulitis. 5. Two small low-attenuation liver lesions which cannot be characterized on this unenhanced exam. Consider abdomen MRI without and with contrast for further characterization. Aortic Atherosclerosis (ICD10-I70.0). Electronically Signed   By: Marlaine Hind M.D.   On: 05/02/2020 14:22   US Renal Transplant w/Doppler  Result Date: 05/02/2020 CLINICAL DATA:  Acute kidney injury. Renal transplant in 2004 for polycystic kidney disease. EXAM: ULTRASOUND OF RENAL TRANSPLANT WITH RENAL DOPPLER ULTRASOUND TECHNIQUE: Ultrasound examination of the renal transplant was performed with gray-scale, color and duplex doppler evaluation. COMPARISON:  None. FINDINGS: Transplant kidney location: Left lower quadrant Transplant Kidney: Renal measurements: 12.5 x 6.4 x 6.7 cm = volume: 21mL. Normal in size and parenchymal echogenicity. No evidence of mass or hydronephrosis. No peri-transplant fluid collection seen. Color flow in the main renal artery:  Yes Color flow in the main renal vein:  Yes Duplex Doppler Evaluation: Main Renal Artery Resistive Index: 0.79 (normal 0.6-0.8; equivocal 0.8-0.9; abnormal >= 0.9) Venous waveform in main renal vein:   Present Intrarenal resistive index in upper pole:  0.57 (normal 0.6-0.8; equivocal 0.8-0.9; abnormal >= 0.9) Intrarenal resistive index in lower  pole: 0.83 (normal 0.6-0.8; equivocal 0.8-0.9; abnormal >= 0.9) Bladder: Normal for degree of bladder distention. Other findings:  None. IMPRESSION: Normal appearance of renal transplant and left lower quadrant. No evidence of hydronephrosis. Unremarkable Doppler ultrasound of renal transplant, with arterial resistive index values within normal limits. Electronically Signed   By: Marlaine Hind M.D.   On: 05/02/2020 14:35    EKG: Independently reviewed.   Assessment/Plan Active Problems:   AKI (acute kidney injury) ([place])  (please populate well all problems here in Problem List. (For example, if patient is on BP meds at home and you resume or decide to hold them, it is a problem that needs to be her. Same for CAD, COPD, HLD and so on)  Sepsis -Evidenced by acute mentation changes, fever 104 last 2 days, and worsening of kidney function -Continue ceftriaxone for now, sent a request for medical records from [facility] -Source likely is ongoing cystitis, CT shows no signs of pyelonephritis  AKI on CKD stage III -From combination of sepsis and poor oral intake -Discussed with on-call nephrologist, agreed with IV hydration for today and reevaluate in the morning  Hypertension -From sepsis, IV hydration, monitor volume status -Hold all BP meds  Acute non-anion gap metabolic acidosis From AKI, agreed with Ringer lactate Recheck BMP in the morning  Respiratory distress -Part of this problem seems like body correction of acidosis, will check a VBG -Patient is wheezing, I discussed with patient wife does not look like patient has history of COPD or asthma -Check echocardiogram, rule out cardiac asthma -Breathing meds with DuoNeb scheduled and as needed, Pulmicort  Acute hyponatremia -Hypovolemic -Correct volume first and then follow-up with  sodium level closely avoid abrupt correction  Kidney transplant CT showed normal structure of the kidneys, low suspicion for rejection Will escalate his steroid to stress dose hydrocortisone  Chronic thrombocytopenia Monitor platelet level, suspect this related to alcohol abuse  Alcohol abuse -No symptoms or signs of active withdrawal -CIWA protocol with as needed Ativan  DVT prophylaxis: Heparin subcu Code Status: Full code Family Communication: Wife at bedside Disposition Plan: Patient sick, likely will need more than 2 midnight hospital stay Consults called: Nephrology Admission status: PCU   Lequita Halt MD [facility] Pager [phone]    05/02/2020, 4:35 PM

## 2020-05-02 NOTE — ED Notes (Signed)
Family at bedside. 

## 2020-05-02 NOTE — ED Triage Notes (Signed)
Pt states his doctor recommended him to come to ED to be evaluated.  [place].  Pt unhappy with care he received there and states he was being treated for alcohol detox without his consent and that he ended up signing out [place].  Reports that he frequently has diarrhea and had intermittent abd pain after taking imodium for 3 days.  Denies abd pain at present.  Reports loss of appetite and nausea.  States he was also treated for UTI but didn't receive antibiotic Rx since he signed out AMA.  States he occasionally has urinary incontinence.  Difficulty getting pt to focus on today's complaint instead of frustration with previous hospital.  States he was also told liver labs were elevated and he believes they were not correct.

## 2020-05-02 NOTE — ED Provider Notes (Signed)
[facility] Emergency Department Provider Note MRN:  [id number]  Arrival date & time: 05/02/20     Chief Complaint   Nausea History of Present Illness   James Zimmerman is a 68 y.o. year-old male with a history of kidney transplant, diabetes presenting to the ED with chief complaint of nausea.  Patient complaining of poor appetite and vomiting with any intake for the past several days.  Was admitted to an outside hospital near [facility] this past week but left AGAINST MEDICAL ADVICE.  Patient continues to feel generally unwell, drinks 1 bottle of wine every night.  Endorsing subjective fevers, denies chest pain or shortness of breath, endorsing diffuse abdominal pain and bloating.  Patient is somnolent and is falling asleep during our conversations.  I was unable to obtain an accurate HPI, PMH, or ROS due to the patient's altered mental status.  Level 5 caveat.  Review of Systems  A complete 10 system review of systems was obtained and all systems are negative except as noted in the HPI and PMH.   Patient's Health History    Past Medical History:  Diagnosis Date  . Anxiety and depression    crowds, closed in spaces, used to sleep, prn  . Chronic renal insufficiency, stage III (moderate)    polycystic kidney dz. (followed by Dr. Patel/Nephrology)--kidney transplant 2004.  Cr baseline as of 02/2020 is 1.6-1.9 (GFR 35-32m./min).  . DDD (degenerative disc disease), lumbar   . Deep vein thrombosis (DVT) of left upper extremity (HCC)    due to PICC line  . Diabetes mellitus with complication (HCC)    Mild non-proliferative diab retinopathy OS.  Hba1c at nephrologist's 02/18/18 was 5.8%. A1c at nephrol 02/2020 is 5.7%.  . Diverticulosis    + hx of 'itis  . Gastritis 01/2017   likely due to alcohol  . GERD (gastroesophageal reflux disease)   . Glaucoma    severe  . Gout   . H/O nonmelanoma skin cancer    SCC (chest, face, head)--s/p Mohs 2009.  BCC-s/p  Mohs 2021  . H/O polycystic kidney disease   . History of depression   . History of kidney transplant    2004   . History of retinal detachment    Dr. Baird Cancer  . Hx of adenomatous colonic polyps 02/2012; 05/2014; 08/2017   2013 and 2015 tubular adenomas (Dr. Oletta Lamas). 08/2017 NO POLYPS->recall 5 yrs.  . Hyperlipemia, mixed    Lipids at goal 02/2020  . Hypertension   . Osteoarthritis    s/p R THR    Past Surgical History:  Procedure Laterality Date  . COLONOSCOPY  06/10/14; 09/05/17   2015: Tubular adenoma x 4: recall 3 yrs (Dr. Alferd Apa [facility]). 08/2017 NO POLYPS. Recall 5 yrs.  . COLONOSCOPY  03/19/2012   tubular adenoma x 1; diverticulosis, int hem.Procedure: COLONOSCOPY;  Surgeon: Winfield Cunas., MD;  Location: [facility] ENDOSCOPY;  Service: Endoscopy;  Laterality: N/A;  . HOT HEMOSTASIS  03/19/2012   Procedure: HOT HEMOSTASIS (ARGON PLASMA COAGULATION/BICAP);  Surgeon: Winfield Cunas., MD;  Location: [facility] ENDOSCOPY;  Service: Endoscopy;  Laterality: N/A;  . KIDNEY TRANSPLANT  2004  . NOSE SURGERY  09/03/2019   skin cancer  . TOTAL HIP ARTHROPLASTY  12/10/2012   Procedure: TOTAL HIP ARTHROPLASTY ANTERIOR APPROACH;  Surgeon: Mauri Pole, MD;  Location: [facility] ORS;  Service: Orthopedics;  Laterality: Right;    Family History  Problem Relation Age of Onset  . Diabetes Mother   .  Heart attack Mother   . Hyperlipidemia Father   . Kidney disease Father   . Hypertension Father   . Kidney disease Brother   . Heart disease Brother   . Diabetes Maternal Grandmother   . Kidney disease Paternal Grandfather   . Polycystic kidney disease Other   . Anesthesia problems Neg Hx   . Hypotension Neg Hx   . Malignant hyperthermia Neg Hx   . Pseudochol deficiency Neg Hx     Social History   Socioeconomic History  . Marital status: Married    Spouse name: Not on file  . Number of children: Not on file  . Years of education: Not on file  . Highest education level: Not on file  Occupational  History  . Not on file  Tobacco Use  . Smoking status: Former Smoker    Packs/day: 2.00    Years: 30.00    Pack years: 60.00    Types: Cigarettes    Quit date: 12/26/1983    Years since quitting: 36.3  . Smokeless tobacco: Never Used  Substance and Sexual Activity  . Alcohol use: Yes    Alcohol/week: 28.0 standard drinks    Types: 28 Glasses of wine per week    Comment: every day 3 glasses of wine  . Drug use: No  . Sexual activity: Not on file  Other Topics Concern  . Not on file  Social History Narrative   Married, one son.   Occupation: retired Merchant navy officer.   Orig from [place].   Former smoker x 20 yrs, quit in 52s.   Alcohol: 3 glasses of wine nightly, occ beer, rare hard liquor.   Social Determinants of Health   Financial Resource Strain:   . Difficulty of Paying Living Expenses:   Food Insecurity:   . Worried About Charity fundraiser in the Last Year:   . Arboriculturist in the Last Year:   Transportation Needs:   . Film/video editor (Medical):   Marland Kitchen Lack of Transportation (Non-Medical):   Physical Activity:   . Days of Exercise per Week:   . Minutes of Exercise per Session:   Stress:   . Feeling of Stress :   Social Connections:   . Frequency of Communication with Friends and Family:   . Frequency of Social Gatherings with Friends and Family:   . Attends Religious Services:   . Active Member of Clubs or Organizations:   . Attends Archivist Meetings:   Marland Kitchen Marital Status:   Intimate Partner Violence:   . Fear of Current or Ex-Partner:   . Emotionally Abused:   Marland Kitchen Physically Abused:   . Sexually Abused:      Physical Exam   Vitals:   05/02/20 1154  BP: 109/65  Pulse: 78  Resp: 18  Temp: 97.7 F (36.5 C)  SpO2: 99%    CONSTITUTIONAL: Chronically ill-appearing, NAD NEURO: Somnolent, mumbled speech, moves all extremities, falls asleep during conversation EYES:  eyes equal and reactive ENT/NECK:  no LAD, no JVD CARDIO: Regular  rate, well-perfused, normal S1 and S2 PULM:  CTAB no wheezing or rhonchi GI/GU:  normal bowel sounds, non-distended, non-tender MSK/SPINE:  No gross deformities, no edema SKIN:  no rash, atraumatic PSYCH:  Appropriate speech and behavior  *Additional and/or pertinent findings included in MDM below  Diagnostic and Interventional Summary    EKG Interpretation  Date/Time:  Sunday May 02 2020 12:39:29 EDT Ventricular Rate:  79 PR Interval:    QRS  Duration: 99 QT Interval:  400 QTC Calculation: 459 R Axis:   -16 Text Interpretation: Sinus rhythm Borderline left axis deviation Abnormal R-wave progression, late transition ST elevation, consider inferior injury Confirmed by Gerlene Fee [phone]) on 05/02/2020 2:48:24 PM      Labs Reviewed  LIPASE, BLOOD - Abnormal; Notable for the following components:      Result Value   Lipase 171 (*)    All other components within normal limits  COMPREHENSIVE METABOLIC PANEL - Abnormal; Notable for the following components:   Sodium 128 (*)    CO2 18 (*)    Glucose, Bld 62 (*)    BUN 49 (*)    Creatinine, Ser 3.33 (*)    Calcium 8.6 (*)    Total Protein 5.7 (*)    Albumin 2.9 (*)    AST 89 (*)    Total Bilirubin 1.5 (*)    GFR calc non Af Amer 18 (*)    GFR calc Af Amer 21 (*)    All other components within normal limits  CBC - Abnormal; Notable for the following components:   RBC 3.64 (*)    Hemoglobin 11.5 (*)    HCT 36.3 (*)    Platelets 107 (*)    All other components within normal limits  URINALYSIS, ROUTINE W REFLEX MICROSCOPIC - Abnormal; Notable for the following components:   Color, Urine AMBER (*)    APPearance CLOUDY (*)    Hgb urine dipstick LARGE (*)    Ketones, ur 5 (*)    Protein, ur 100 (*)    Leukocytes,Ua SMALL (*)    RBC / HPF >50 (*)    WBC, UA >50 (*)    All other components within normal limits  POCT I-STAT EG7 - Abnormal; Notable for the following components:   pCO2, Ven 42.8 (*)    pO2, Ven 22.0 (*)     Bicarbonate 19.8 (*)    TCO2 21 (*)    Acid-base deficit 7.0 (*)    Sodium 128 (*)    HCT 33.0 (*)    Hemoglobin 11.2 (*)    All other components within normal limits  CULTURE, BLOOD (SINGLE)  URINE CULTURE  PROTIME-INR  AMMONIA  ETHANOL  I-STAT VENOUS BLOOD GAS, ED    US Renal Transplant w/Doppler  Final Result    CT ABDOMEN PELVIS WO CONTRAST  Final Result      Medications  sodium chloride flush (NS) 0.9 % injection 3 mL (has no administration in time range)  sodium chloride 0.9 % bolus 1,000 mL (has no administration in time range)  cefTRIAXone (ROCEPHIN) 1 g in sodium chloride 0.9 % 100 mL IVPB (has no administration in time range)     Procedures  /  Critical Care Procedures  ED Course and Medical Decision Making  I have reviewed the triage vital signs, the nursing notes, and pertinent available records from the EMR.  Listed above are laboratory and imaging tests that I personally ordered, reviewed, and interpreted and then considered in my medical decision making (see below for details).      Considering metabolic disarray, hypercarbia, intra-abdominal infection or obstruction given the abdominal pain, acute rejection given the poor p.o. intake and questionable ability to take immunosuppressants.  Labs are revealing elevated lipase, AKI, will obtain CT imaging of the abdomen without contrast as well as ultrasound of the transplanted kidney for further evaluation.  Patient will need admission.  Imaging is largely reassuring, evidence of cystitis but no  other acute process.  Rather than acute rejection patient's AKI could also be explained by dehydration and/or UTI.  Will admit to hospital service for further care.  Barth Kirks. Sedonia Small, [place] [email or web address]  Final Clinical Impressions(s) / ED Diagnoses     ICD-10-CM   1. AKI (acute kidney injury) ([place])  N17.9   2. History of kidney transplant  Z94.0   3.  Acute cystitis with hematuria  N30.01   4. Dehydration  E86.0     ED Discharge Orders    None       Discharge Instructions Discussed with and Provided to Patient:   Discharge Instructions   None       Maudie Flakes, MD 05/02/20 [phone]

## 2020-05-02 NOTE — ED Notes (Signed)
Nephrology at bedside

## 2020-05-02 NOTE — ED Notes (Signed)
Dinner Tray Ordered @ 1800.

## 2020-05-03 ENCOUNTER — Inpatient Hospital Stay (HOSPITAL_COMMUNITY): Payer: Medicare Other

## 2020-05-03 LAB — URINE CULTURE: Culture: NO GROWTH

## 2020-05-03 LAB — CBC
HCT: 34.8 % — ABNORMAL LOW (ref 39.0–52.0)
Hemoglobin: 11.3 g/dL — ABNORMAL LOW (ref 13.0–17.0)
MCH: 32.2 pg (ref 26.0–34.0)
MCHC: 32.5 g/dL (ref 30.0–36.0)
MCV: 99.1 fL (ref 80.0–100.0)
Platelets: 106 10*3/uL — ABNORMAL LOW (ref 150–400)
RBC: 3.51 MIL/uL — ABNORMAL LOW (ref 4.22–5.81)
RDW: 14.7 % (ref 11.5–15.5)
WBC: 6.8 10*3/uL (ref 4.0–10.5)
nRBC: 0 % (ref 0.0–0.2)

## 2020-05-03 LAB — BLOOD GAS, ARTERIAL
Acid-base deficit: 8.7 mmol/L — ABNORMAL HIGH (ref 0.0–2.0)
Bicarbonate: 17.7 mmol/L — ABNORMAL LOW (ref 20.0–28.0)
Drawn by: 301361
FIO2: 21
O2 Saturation: 87.2 %
Patient temperature: 36.9
pCO2 arterial: 45.3 mmHg (ref 32.0–48.0)
pH, Arterial: 7.216 — ABNORMAL LOW (ref 7.350–7.450)
pO2, Arterial: 59.2 mmHg — ABNORMAL LOW (ref 83.0–108.0)

## 2020-05-03 LAB — COMPREHENSIVE METABOLIC PANEL
ALT: 42 U/L (ref 0–44)
AST: 80 U/L — ABNORMAL HIGH (ref 15–41)
Albumin: 2.8 g/dL — ABNORMAL LOW (ref 3.5–5.0)
Alkaline Phosphatase: 74 U/L (ref 38–126)
Anion gap: 14 (ref 5–15)
BUN: 53 mg/dL — ABNORMAL HIGH (ref 8–23)
CO2: 16 mmol/L — ABNORMAL LOW (ref 22–32)
Calcium: 8.3 mg/dL — ABNORMAL LOW (ref 8.9–10.3)
Chloride: 99 mmol/L (ref 98–111)
Creatinine, Ser: 3.08 mg/dL — ABNORMAL HIGH (ref 0.61–1.24)
GFR calc Af Amer: 23 mL/min — ABNORMAL LOW (ref >60)
GFR calc non Af Amer: 20 mL/min — ABNORMAL LOW (ref >60)
Glucose, Bld: 189 mg/dL — ABNORMAL HIGH (ref 70–99)
Potassium: 5.3 mmol/L — ABNORMAL HIGH (ref 3.5–5.1)
Sodium: 129 mmol/L — ABNORMAL LOW (ref 135–145)
Total Bilirubin: 1.5 mg/dL — ABNORMAL HIGH (ref 0.3–1.2)
Total Protein: 5.7 g/dL — ABNORMAL LOW (ref 6.5–8.1)

## 2020-05-03 LAB — GLUCOSE, CAPILLARY
Glucose-Capillary: 204 mg/dL — ABNORMAL HIGH (ref 70–99)
Glucose-Capillary: 199 mg/dL — ABNORMAL HIGH (ref 70–99)
Glucose-Capillary: 193 mg/dL — ABNORMAL HIGH (ref 70–99)
Glucose-Capillary: 160 mg/dL — ABNORMAL HIGH (ref 70–99)

## 2020-05-03 MED ORDER — SODIUM BICARBONATE 650 MG PO TABS
650.0000 mg | ORAL_TABLET | Freq: Two times a day (BID) | ORAL | Status: DC
  Administered 2020-05-03 – 2020-05-04 (×4): 650 mg via ORAL
  Filled 2020-05-03 (×4): qty 1

## 2020-05-03 MED ORDER — TACROLIMUS 1 MG PO CAPS
1.5000 mg | ORAL_CAPSULE | Freq: Two times a day (BID) | ORAL | Status: DC
  Administered 2020-05-04 (×2): 1.5 mg via ORAL
  Filled 2020-05-03 (×3): qty 1

## 2020-05-03 MED ORDER — IPRATROPIUM-ALBUTEROL 0.5-2.5 (3) MG/3ML IN SOLN
3.0000 mL | Freq: Two times a day (BID) | RESPIRATORY_TRACT | Status: DC
  Administered 2020-05-03 – 2020-05-04 (×3): 3 mL via RESPIRATORY_TRACT
  Filled 2020-05-03 (×3): qty 3

## 2020-05-03 NOTE — Progress Notes (Signed)
Pt using pursed lip breathing, audible wheezes noted. RT at bedside this AM. Pt falls asleep mid conversation, significant belly breathing. Has significant episodes of apnea then large snore. SpO2 drops to 81% while sleeping then recovers well to 92% on room air. RR 18. MD aware.

## 2020-05-03 NOTE — Progress Notes (Signed)
Pt tx to 2W, Pt's family at bedside, take to Pt's new room with tx. Aware of pt being tx and reason why

## 2020-05-03 NOTE — Progress Notes (Signed)
PROGRESS NOTE    James Zimmerman  [id number] DOB: 1951-10-15 DOA: 05/02/2020 PCP: [name], MD   Brief Narrative: 69 y.o. male with CKD Stage IIIb ( baseline creat 1.8-1.9 in 10/2019) PCKD s/p kidney transplant in 2004 on maintenance immune modulation meds, IIDM, Gout, EtOH abuse presented with worsening urinary symptoms dysuria/hematuria, episode of confusion and agitation as well as increasing shortness of breath.   Symptoms started about 4 days ago while on a trip to [facility], patient started to have urinary frequency and burning sensations low-grade fever and went to local hospital [facility], when patient was diagnosed with UTI.  Antibiotics was started in the ED, and according patient wife there was image study done in the ED showed normal transplant kidney.  However the next day patient spiked fever 104, and mental status was patient became confused with episodic agitation, antibiotics was switched, without significant improvement in last 2 days. He eventually signed AMA and came to ER at [facility] 05/02/20.Patient also described feeling nauseous and significant decrease of appetite over the last 3 days.  In ED ,appeared to have severe dehydration with borderline hypotension, sodium 128, bicarb 18, glucose 62, BUN 49 creatinine 2.3 compared to 1.8 2 months ago.  WBC 8.7, hemoglobin 11.5.  CT abdomen showed no significant acute changes in her transplanted kidney.  Kidney Doppler within normal limits.  Nephrology was consulted and patient was admitted.  Subjective: Seen this am. Wife at bedside Patient sleeping and snoring, but woke up to interact. Spo2 drops to 8os ins sleep. "I am feeling better".  Overnight afebrile T-max 97.8, saturating 94-95% room air, blood pressure stable. Blood work with sodium improved at 129, potassium decreasing 5.8 BUN/creatinine 53/3.0, hco at 16  Assessment & Plan:  Sepsis, with altered mental status fever 104 last 2 days and worsening  AKI.  Suspected source cystitis CT shows no signs of pyelonephritis, but with mild diffuse wall thickening of urinary bladder. Patient is on ceftriaxone will cont the same. Blood culture/urine culture pending.  AKI on CKD stage IIIb : Plan creatinine 1.81.99 March/2020 appreciate nephrology on board, patient with transplant kidney PCKD status doing gentle hydration.  creatinine improving.  Patient has transplant kidney and on CellCept, tacrolimus and hydrocortisone iv at 50 mg. Cont on RL as per nephro. Recent Labs  Lab 05/02/20 1207 05/03/20 0219  BUN 49* 53*  CREATININE 3.33* 3.08*   Hyperkalemia improving.  Monitor. Recent Labs  Lab 05/02/20 1207 05/02/20 1251 05/02/20 1844 05/03/20 0219  K 4.6 4.5 5.8* 5.3*   Hyponatremia in the setting of dehydration continue hydration sodium improving. Recent Labs  Lab 05/02/20 1207 05/02/20 1251 05/02/20 1844 05/03/20 0219  NA 128* 128* 126* 129*   Non-anion gap metabolic acidosis in the setting of dehydration/CKD, bicarb at 16. Cont po bicarb.  Respiratory distress on admission suspected from acidosis.  No known history of COPD or asthma, echo has been ordered.  I do not see x-ray chest so will order one.  Sleepiness/Snoring during sleep and hypoxia in sleep-check ABG, suspect sleep apnea, patient wife reports that he has been advised to go to sleep evaluation multiple times but patient refuses.  Urine retention/dysuria UA abnormal with ketones 5/nitrite negative leukocytes small RBC and WBC more than 50 both: pending urine culture.   Hypertension blood pressure stable holding meds for now.  Two small low-attenuation liver lesions consider MRI without and with contrast, will advise pcp follow up Anemia of chronic disease stable.  Alcohol abuse  no symptoms/signs of withdrawal.  CIWA protocol Ativan  Chronic Thrombocytopenia platelet low but stable. Recent Labs  Lab 05/02/20 1207 05/03/20 0219  PLT 107* 106*   Discussed w RN,to  obtain records from [facility]. DVT prophylaxis:Heparin Code Status:FULL Family Communication: plan of care discussed with patient and his wife at bedside.  Status is: Inpatient Remains inpatient appropriate because:Ongoing diagnostic testing needed not appropriate for outpatient work up, IV treatments appropriate due to intensity of illness or inability to take PO and Inpatient level of care appropriate due to severity of illness  Dispo: The patient is from: Home              Anticipated d/c is to: Home              Anticipated d/c date is: 3 days              Patient currently is not medically stable to d/c. Diet Order            Diet renal/carb modified with fluid restriction Diet-HS Snack? Nothing; Fluid restriction: 1200 mL Fluid; Room service appropriate? Yes; Fluid consistency: Thin  Diet effective now              Body mass index is 35.13 kg/m. Consultants:see note  Procedures:see note Microbiology:see note  Medications: Scheduled Meds: . allopurinol  200 mg Oral Daily  . budesonide (PULMICORT) nebulizer solution  0.25 mg Nebulization BID  . FLUoxetine  40 mg Oral Daily  . folic acid  1 mg Oral Daily  . heparin  5,000 Units Subcutaneous Q8H  . hydrocortisone sod succinate (SOLU-CORTEF) inj  50 mg Intravenous Q6H  . insulin aspart  0-6 Units Subcutaneous TID WC  . ipratropium-albuterol  3 mL Nebulization BID  . multivitamin with minerals  1 tablet Oral Daily  . mycophenolate  250 mg Oral BID  . pantoprazole  20 mg Oral q morning - 10a  . rosuvastatin  20 mg Oral Daily  . sodium bicarbonate  650 mg Oral BID  . sodium chloride flush  3 mL Intravenous Once  . tacrolimus  0.5 mg Oral BID  . tacrolimus  1 mg Oral BID  . thiamine  100 mg Oral Daily   Or  . thiamine  100 mg Intravenous Daily   Continuous Infusions: . cefTRIAXone (ROCEPHIN)  IV    . lactated ringers 125 mL/hr at 05/03/20 0700    Antimicrobials: Anti-infectives (From admission, onward)    Start     Dose/Rate Route Frequency Ordered Stop   05/03/20 1600  cefTRIAXone (ROCEPHIN) 1 g in sodium chloride 0.9 % 100 mL IVPB     1 g 200 mL/hr over 30 Minutes Intravenous Every 24 hours 05/02/20 1546     05/02/20 1430  cefTRIAXone (ROCEPHIN) 1 g in sodium chloride 0.9 % 100 mL IVPB     1 g 200 mL/hr over 30 Minutes Intravenous  Once 05/02/20 1427 05/02/20 1620       Objective: Vitals: Today's Vitals   05/02/20 2135 05/03/20 0445 05/03/20 0800 05/03/20 0834  BP: (!) 142/74 129/69 (!) 148/80   Pulse: 89  94 92  Resp: 19 17 (!) 24   Temp:  97.6 F (36.4 C) 98.2 F (36.8 C)   TempSrc:  Oral Oral   SpO2: 94% 95% 96% 92%  Weight: 117.5 kg     Height: 6' (1.829 m)     PainSc: 0-No pain   7     Intake/Output Summary (Last  24 hours) at 05/03/2020 0948 Last data filed at 05/03/2020 0900 Gross per 24 hour  Intake 3257.76 ml  Output 1525 ml  Net 1732.76 ml   Filed Weights   05/02/20 1154 05/02/20 2135  Weight: 113.4 kg 117.5 kg   Weight change:    Intake/Output from previous day: 05/09 0701 - 05/10 0700 In: 3008.1 [P.O.:240; I.V.:1668.1; IV Piggyback:1100] Out: 1350 [Urine:1350] Intake/Output this shift: Total I/O In: 249.7 [I.V.:249.7] Out: 175 [Urine:175]  Examination: General exam:AAO,sleepy, obese. HEENT:Oral mucosa moist, Ear/Nose WNL grossly,dentition normal. Respiratory system: bilaterally clear,no wheezing or crackles,no use of accessory muscle, non tender. Cardiovascular system: S1 & S2 +, regular, No JVD. Gastrointestinal system: Abdomen soft, NT,ND, BS+. Nervous System:Alert, awake, moving extremities and grossly nonfocal Extremities: No edema, distal peripheral pulses palpable.  Skin: No rashes,no icterus. MSK: Normal muscle bulk,tone, power  Data Reviewed: I have personally reviewed following labs and imaging studies CBC: Recent Labs  Lab 05/02/20 1207 05/02/20 1251 05/02/20 1844 05/03/20 0219  WBC 8.7  --   --  6.8  HGB 11.5* 11.2* 10.5*  11.3*  HCT 36.3* 33.0* 31.0* 34.8*  MCV 99.7  --   --  99.1  PLT 107*  --   --  [id number]*   Basic Metabolic Panel: Recent Labs  Lab 05/02/20 1207 05/02/20 1251 05/02/20 1844 05/03/20 0219  NA 128* 128* 126* 129*  K 4.6 4.5 5.8* 5.3*  CL 98  --   --  99  CO2 18*  --   --  16*  GLUCOSE 62*  --   --  189*  BUN 49*  --   --  53*  CREATININE 3.33*  --   --  3.08*  CALCIUM 8.6*  --   --  8.3*   GFR: Estimated Creatinine Clearance: 30 mL/min (A) (by C-G formula based on SCr of 3.08 mg/dL (H)). Liver Function Tests: Recent Labs  Lab 05/02/20 1207 05/03/20 0219  AST 89* 80*  ALT 40 42  ALKPHOS 69 74  BILITOT 1.5* 1.5*  PROT 5.7* 5.7*  ALBUMIN 2.9* 2.8*   Recent Labs  Lab 05/02/20 1207  LIPASE 171*   Recent Labs  Lab 05/02/20 1244  AMMONIA 9   Coagulation Profile: Recent Labs  Lab 05/02/20 1207  INR 1.1   Cardiac Enzymes: No results for input(s): CKTOTAL, CKMB, CKMBINDEX, TROPONINI in the last 168 hours. BNP (last 3 results) No results for input(s): PROBNP in the last 8760 hours. HbA1C: Recent Labs    05/02/20 1704  HGBA1C 5.9*   CBG: Recent Labs  Lab 05/02/20 1829 05/02/20 2257 05/03/20 0720  GLUCAP 90 141* 204*   Lipid Profile: No results for input(s): CHOL, HDL, LDLCALC, TRIG, CHOLHDL, LDLDIRECT in the last 72 hours. Thyroid Function Tests: No results for input(s): TSH, T4TOTAL, FREET4, T3FREE, THYROIDAB in the last 72 hours. Anemia Panel: No results for input(s): VITAMINB12, FOLATE, FERRITIN, TIBC, IRON, RETICCTPCT in the last 72 hours. Sepsis Labs: No results for input(s): PROCALCITON, LATICACIDVEN in the last 168 hours.  Recent Results (from the past 240 hour(s))  Culture, blood (single) w Reflex to ID Panel     Status: None (Preliminary result)   Collection Time: 05/02/20 12:43 PM   Specimen: BLOOD  Result Value Ref Range Status   Specimen Description BLOOD LEFT ANTECUBITAL  Final   Special Requests   Final    BOTTLES DRAWN AEROBIC AND  ANAEROBIC Blood Culture adequate volume   Culture   Final    NO GROWTH < 24  HOURS Performed at [facility], [place] [address] [place], [place] [address]    Report Status PENDING  Incomplete  Respiratory Panel by RT PCR (Flu A&B, Covid) - Nasopharyngeal Swab     Status: None   Collection Time: 05/02/20  8:54 PM   Specimen: Nasopharyngeal Swab  Result Value Ref Range Status   SARS Coronavirus 2 by RT PCR NEGATIVE NEGATIVE Final    Comment: (NOTE) SARS-CoV-2 target nucleic acids are NOT DETECTED. The SARS-CoV-2 RNA is generally detectable in upper respiratoy specimens during the acute phase of infection. The lowest concentration of SARS-CoV-2 viral copies this assay can detect is 131 copies/mL. A negative result does not preclude SARS-Cov-2 infection and should not be used as the sole basis for treatment or other patient management decisions. A negative result may occur with  improper specimen collection/handling, submission of specimen other than nasopharyngeal swab, presence of viral mutation(s) within the areas targeted by this assay, and inadequate number of viral copies (<131 copies/mL). A negative result must be combined with clinical observations, patient history, and epidemiological information. The expected result is Negative. Fact Sheet for Patients:  PinkCheek.be Fact Sheet for Healthcare Providers:  GravelBags.it This test is not yet ap proved or cleared by the [place] FDA and  has been authorized for detection and/or diagnosis of SARS-CoV-2 by FDA under an Emergency Use Authorization (EUA). This EUA will remain  in effect (meaning this test can be used) for the duration of the COVID-19 declaration under Section 564(b)(1) of the Act, 21 U.S.C. section 360bbb-3(b)(1), unless the authorization is terminated or revoked sooner.    Influenza A by PCR NEGATIVE NEGATIVE Final   Influenza B by PCR NEGATIVE  NEGATIVE Final    Comment: (NOTE) The Xpert Xpress SARS-CoV-2/FLU/RSV assay is intended as an aid in  the diagnosis of influenza from Nasopharyngeal swab specimens and  should not be used as a sole basis for treatment. Nasal washings and  aspirates are unacceptable for Xpert Xpress SARS-CoV-2/FLU/RSV  testing. Fact Sheet for Patients: PinkCheek.be Fact Sheet for Healthcare Providers: GravelBags.it This test is not yet approved or cleared by the [place] FDA and  has been authorized for detection and/or diagnosis of SARS-CoV-2 by  FDA under an Emergency Use Authorization (EUA). This EUA will remain  in effect (meaning this test can be used) for the duration of the  Covid-19 declaration under Section 564(b)(1) of the Act, 21  U.S.C. section 360bbb-3(b)(1), unless the authorization is  terminated or revoked. Performed at [facility], [place] [address] [place], [place] [address]   MRSA PCR Screening     Status: None   Collection Time: 05/02/20  9:23 PM   Specimen: Nasal Mucosa; Nasopharyngeal  Result Value Ref Range Status   MRSA by PCR NEGATIVE NEGATIVE Final    Comment:        The GeneXpert MRSA Assay (FDA approved for NASAL specimens only), is one component of a comprehensive MRSA colonization surveillance program. It is not intended to diagnose MRSA infection nor to guide or monitor treatment for MRSA infections. Performed at [facility], [place] [address], [place], [place] [address]       Radiology Studies: CT ABDOMEN PELVIS WO CONTRAST  Result Date: 05/02/2020 CLINICAL DATA:  Abdominal pain, nausea, diarrhea. Urinary tract infection. Renal transplant patient. EXAM: CT ABDOMEN AND PELVIS WITHOUT CONTRAST TECHNIQUE: Multidetector CT imaging of the abdomen and pelvis was performed following the standard protocol without IV contrast. COMPARISON:  None.  FINDINGS: Lower chest: No acute findings.  Hepatobiliary: A 2.6 cm low-attenuation lesion is seen in the left hepatic lobe, and a 1.6 cm lesion is seen in the right hepatic lobe. These cannot be characterized on this unenhanced exam. Gallbladder is unremarkable. No evidence of biliary ductal dilatation. Pancreas: No mass or inflammatory process visualized on this unenhanced exam. Spleen:  Within normal limits in size. Adrenals/Urinary tract: Normal adrenal glands. The native kidneys are markedly enlarged and diffusely involved by numerous cysts, consistent with autosomal dominant polycystic kidney disease. No evidence of ureteral calculi or hydronephrosis. Renal transplant is seen in the left iliac fossa and is unremarkable in appearance. No evidence of hydronephrosis. Urinary bladder shows mild diffuse wall thickening, and cystitis cannot be excluded. Stomach/Bowel: No evidence of obstruction, inflammatory process, or abnormal fluid collections. Normal appendix visualized. Diverticulosis is seen mainly involving the sigmoid colon, however there is no evidence of diverticulitis. Vascular/Lymphatic: No pathologically enlarged lymph nodes identified. No evidence of abdominal aortic aneurysm. Aortic atherosclerosis noted. Reproductive:  No mass or other significant abnormality. Other:  None. Musculoskeletal: No suspicious bone lesions identified. Right hip prosthesis noted. IMPRESSION: 1. Normal appearance of renal transplant on this unenhanced exam. No evidence of hydronephrosis. 2. 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 3. Mild diffuse wall thickening of urinary bladder, which may be due to cystitis. Suggest correlation with urinalysis. 4. Colonic diverticulosis, without radiographic evidence of diverticulitis. 5. Two small low-attenuation liver lesions which cannot be characterized on this unenhanced exam. Consider abdomen MRI without and with contrast for further characterization. Aortic Atherosclerosis (ICD10-I70.0).  Electronically Signed   By: Marlaine Hind M.D.   On: 05/02/2020 14:22   US Renal Transplant w/Doppler  Result Date: 05/02/2020 CLINICAL DATA:  Acute kidney injury. Renal transplant in 2004 for polycystic kidney disease. EXAM: ULTRASOUND OF RENAL TRANSPLANT WITH RENAL DOPPLER ULTRASOUND TECHNIQUE: Ultrasound examination of the renal transplant was performed with gray-scale, color and duplex doppler evaluation. COMPARISON:  None. FINDINGS: Transplant kidney location: Left lower quadrant Transplant Kidney: Renal measurements: 12.5 x 6.4 x 6.7 cm = volume: 258mL. Normal in size and parenchymal echogenicity. No evidence of mass or hydronephrosis. No peri-transplant fluid collection seen. Color flow in the main renal artery:  Yes Color flow in the main renal vein:  Yes Duplex Doppler Evaluation: Main Renal Artery Resistive Index: 0.79 (normal 0.6-0.8; equivocal 0.8-0.9; abnormal >= 0.9) Venous waveform in main renal vein:  Present Intrarenal resistive index in upper pole:  0.57 (normal 0.6-0.8; equivocal 0.8-0.9; abnormal >= 0.9) Intrarenal resistive index in lower pole: 0.83 (normal 0.6-0.8; equivocal 0.8-0.9; abnormal >= 0.9) Bladder: Normal for degree of bladder distention. Other findings:  None. IMPRESSION: Normal appearance of renal transplant and left lower quadrant. No evidence of hydronephrosis. Unremarkable Doppler ultrasound of renal transplant, with arterial resistive index values within normal limits. Electronically Signed   By: Marlaine Hind M.D.   On: 05/02/2020 14:35   ECHOCARDIOGRAM COMPLETE  Result Date: 05/02/2020    ECHOCARDIOGRAM REPORT   Patient Name:   James Zimmerman Date of Exam: 05/02/2020 Medical Rec #:  [id number]          Height:       72.0 in Accession #:    [id number]         Weight:       250.0 lb Date of Birth:  11/22/1951          BSA:          2.343 m Patient Age:  68 years           BP:           109/65 mmHg Patient Gender: M                  HR:           80 bpm. Exam Location:   Inpatient Procedure: 2D Echo Indications:    dyspnea 786.09  History:        Patient has no prior history of Echocardiogram examinations. End                 stage renal disease; Risk Factors:Diabetes, Dyslipidemia and                 Hypertension.  Sonographer:    Johny Chess Referring Phys: [id number] [place]  1. Left ventricular ejection fraction, by estimation, is 60 to 65%. The left ventricle has normal function. The left ventricle has no regional wall motion abnormalities. There is mild left ventricular hypertrophy. Left ventricular diastolic parameters were normal.  2. Right ventricular systolic function is normal. The right ventricular size is normal.  3. Left atrial size was mildly dilated.  4. The mitral valve is normal in structure. Trivial mitral valve regurgitation. No evidence of mitral stenosis.  5. The aortic valve was not well visualized. Aortic valve regurgitation is mild to moderate. Mild aortic valve stenosis.  6. The inferior vena cava is normal in size with greater than 50% respiratory variability, suggesting right atrial pressure of 3 mmHg. FINDINGS  Left Ventricle: Left ventricular ejection fraction, by estimation, is 60 to 65%. The left ventricle has normal function. The left ventricle has no regional wall motion abnormalities. The left ventricular internal cavity size was normal in size. There is  mild left ventricular hypertrophy. Left ventricular diastolic parameters were normal. Right Ventricle: The right ventricular size is normal. Right vetricular wall thickness was not assessed. Right ventricular systolic function is normal. Left Atrium: Left atrial size was mildly dilated. Right Atrium: Right atrial size was normal in size. Pericardium: There is no evidence of pericardial effusion. Mitral Valve: The mitral valve is normal in structure. Trivial mitral valve regurgitation. No evidence of mitral valve stenosis. Tricuspid Valve: The tricuspid valve is normal in  structure. Tricuspid valve regurgitation is not demonstrated. No evidence of tricuspid stenosis. Aortic Valve: The aortic valve was not well visualized. Aortic valve regurgitation is mild to moderate. Aortic regurgitation PHT measures 224 msec. Mild aortic stenosis is present. Aortic valve mean gradient measures 13.0 mmHg. Aortic valve peak gradient  measures 22.8 mmHg. Aortic valve area, by VTI measures 2.03 cm. Pulmonic Valve: The pulmonic valve was not well visualized. Pulmonic valve regurgitation is not visualized. No evidence of pulmonic stenosis. Aorta: The aortic root is normal in size and structure. Pulmonary Artery: Indeterminant PASP, inadequate TR jet. Venous: The inferior vena cava is normal in size with greater than 50% respiratory variability, suggesting right atrial pressure of 3 mmHg. IAS/Shunts: No atrial level shunt detected by color flow Doppler.  LEFT VENTRICLE PLAX 2D LVIDd:         5.10 cm  Diastology LVIDs:         3.70 cm  LV e' lateral:   10.30 cm/s LV PW:         1.20 cm  LV E/e' lateral: 7.6 LV IVS:        1.20 cm  LV e' medial:    8.05 cm/s LVOT diam:  2.50 cm  LV E/e' medial:  9.7 LV SV:         99 LV SV Index:   42 LVOT Area:     4.91 cm  RIGHT VENTRICLE             IVC RV S prime:     15.90 cm/s  IVC diam: 1.90 cm TAPSE (M-mode): 1.9 cm LEFT ATRIUM              Index       RIGHT ATRIUM           Index LA diam:        4.70 cm  2.01 cm/m  RA Area:     18.70 cm LA Vol (A2C):   102.0 ml 43.54 ml/m RA Volume:   51.00 ml  21.77 ml/m LA Vol (A4C):   77.1 ml  32.91 ml/m LA Biplane Vol: 91.4 ml  39.02 ml/m  AORTIC VALVE AV Area (Vmax):    2.05 cm AV Area (Vmean):   1.79 cm AV Area (VTI):     2.03 cm AV Vmax:           239.00 cm/s AV Vmean:          171.000 cm/s AV VTI:            0.487 m AV Peak Grad:      22.8 mmHg AV Mean Grad:      13.0 mmHg LVOT Vmax:         99.79 cm/s LVOT Vmean:        62.430 cm/s LVOT VTI:          0.201 m LVOT/AV VTI ratio: 0.41 AI PHT:            224  msec  AORTA Ao Root diam: 3.30 cm MITRAL VALVE MV Area (PHT): 3.91 cm    SHUNTS MV Decel Time: 194 msec    Systemic VTI:  0.20 m MV E velocity: 78.40 cm/s  Systemic Diam: 2.50 cm MV A velocity: 58.70 cm/s MV E/A ratio:  1.34 Carlyle Dolly MD Electronically signed by Carlyle Dolly MD Signature Date/Time: 05/02/2020/4:54:28 PM    Final      LOS: 1 day   Time spent: More than 50% of that time was spent in counseling and/or coordination of care.  Antonieta Pert, MD [facility]  05/03/2020, 9:48 AM

## 2020-05-03 NOTE — Progress Notes (Signed)
Subjective:  Hemodynamically stable overnight-  1300 of UOP- crt down some-  BUN stable Objective Vital signs in last 24 hours: Vitals:   05/02/20 2049 05/02/20 2117 05/02/20 2135 05/03/20 0445  BP: (!) 144/78  (!) 142/74 129/69  Pulse: 88  89   Resp: (!) 25  19 17   Temp: 97.8 F (36.6 C) 97.6 F (36.4 C)  97.6 F (36.4 C)  TempSrc: Oral Oral  Oral  SpO2: 95%  94% 95%  Weight:   117.5 kg   Height:   6' (1.829 m)    Weight change:   Intake/Output Summary (Last 24 hours) at 05/03/2020 [id number] Last data filed at 05/03/2020 [id number] Gross per 24 hour  Intake 2633.67 ml  Output 1350 ml  Net 1283.67 ml    Assessment/ Plan: Pt is a 69 y.o. 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 UTI ? Disruption of IS meds?  Assessment/Plan: 1. ?uti?  - also with hematuria.  No culture data yet, CT suggestive of cystitis-  Being treated with rocephin at present 2. A on CRF-  In the setting of above plus concern over volume depletion/contrast-  Less likely rejection-  So far improved with fluids/rocephin.  No indications for dialysis-  Will give some oral bicarb in addition to LR 3. S/p transplant-  Placed on IV higher dose steroids -  Cont maintenance prograf/cellcept  4. HTN/volume-  Not overloaded-  Giving LR-  Continue  5.  Hyponatremia- seeming like hypovolemic vs euvolemia-  Improved with LR so far  6. Hyperkalemia-  Improved at last check 7. Dec MS-  Question of ETOH-  Possibly at risk for w/d-  On xanax and prozac-  Also thiamine and folate  James Zimmerman    Labs: Basic Metabolic Panel: Recent Labs  Lab 05/02/20 1207 05/02/20 1207 05/02/20 1251 05/02/20 1844 05/03/20 0219  NA 128*   < > 128* 126* 129*  K 4.6   < > 4.5 5.8* 5.3*  CL 98  --   --   --  99  CO2 18*  --   --   --  16*  GLUCOSE 62*  --   --   --  189*  BUN 49*  --   --   --  53*  CREATININE 3.33*  --   --   --  3.08*  CALCIUM 8.6*   --   --   --  8.3*   < > = values in this interval not displayed.   Liver Function Tests: Recent Labs  Lab 05/02/20 1207 05/03/20 0219  AST 89* 80*  ALT 40 42  ALKPHOS 69 74  BILITOT 1.5* 1.5*  PROT 5.7* 5.7*  ALBUMIN 2.9* 2.8*   Recent Labs  Lab 05/02/20 1207  LIPASE 171*   Recent Labs  Lab 05/02/20 1244  AMMONIA 9   CBC: Recent Labs  Lab 05/02/20 1207 05/02/20 1207 05/02/20 1251 05/02/20 1844 05/03/20 0219  WBC 8.7  --   --   --  6.8  HGB 11.5*   < > 11.2* 10.5* 11.3*  HCT 36.3*   < > 33.0* 31.0* 34.8*  MCV 99.7  --   --   --  99.1  PLT 107*  --   --   --  106*   < > = values in this interval not displayed.   Cardiac Enzymes: No results for input(s): CKTOTAL, CKMB,  CKMBINDEX, TROPONINI in the last 168 hours. CBG: Recent Labs  Lab 05/02/20 1829 05/02/20 2257 05/03/20 0720  GLUCAP 90 141* 204*    Iron Studies: No results for input(s): IRON, TIBC, TRANSFERRIN, FERRITIN in the last 72 hours. Studies/Results: CT ABDOMEN PELVIS WO CONTRAST  Result Date: 05/02/2020 CLINICAL DATA:  Abdominal pain, nausea, diarrhea. Urinary tract infection. Renal transplant patient. EXAM: CT ABDOMEN AND PELVIS WITHOUT CONTRAST TECHNIQUE: Multidetector CT imaging of the abdomen and pelvis was performed following the standard protocol without IV contrast. COMPARISON:  None. FINDINGS: Lower chest: No acute findings. Hepatobiliary: A 2.6 cm low-attenuation lesion is seen in the left hepatic lobe, and a 1.6 cm lesion is seen in the right hepatic lobe. These cannot be characterized on this unenhanced exam. Gallbladder is unremarkable. No evidence of biliary ductal dilatation. Pancreas: No mass or inflammatory process visualized on this unenhanced exam. Spleen:  Within normal limits in size. Adrenals/Urinary tract: Normal adrenal glands. The native kidneys are markedly enlarged and diffusely involved by numerous cysts, consistent with autosomal dominant polycystic kidney disease. No evidence  of ureteral calculi or hydronephrosis. Renal transplant is seen in the left iliac fossa and is unremarkable in appearance. No evidence of hydronephrosis. Urinary bladder shows mild diffuse wall thickening, and cystitis cannot be excluded. Stomach/Bowel: No evidence of obstruction, inflammatory process, or abnormal fluid collections. Normal appendix visualized. Diverticulosis is seen mainly involving the sigmoid colon, however there is no evidence of diverticulitis. Vascular/Lymphatic: No pathologically enlarged lymph nodes identified. No evidence of abdominal aortic aneurysm. Aortic atherosclerosis noted. Reproductive:  No mass or other significant abnormality. Other:  None. Musculoskeletal: No suspicious bone lesions identified. Right hip prosthesis noted. IMPRESSION: 1. Normal appearance of renal transplant on this unenhanced exam. No evidence of hydronephrosis. 2. 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 3. Mild diffuse wall thickening of urinary bladder, which may be due to cystitis. Suggest correlation with urinalysis. 4. Colonic diverticulosis, without radiographic evidence of diverticulitis. 5. Two small low-attenuation liver lesions which cannot be characterized on this unenhanced exam. Consider abdomen MRI without and with contrast for further characterization. Aortic Atherosclerosis (ICD10-I70.0). Electronically Signed   By: Marlaine Hind M.D.   On: 05/02/2020 14:22   US Renal Transplant w/Doppler  Result Date: 05/02/2020 CLINICAL DATA:  Acute kidney injury. Renal transplant in 2004 for polycystic kidney disease. EXAM: ULTRASOUND OF RENAL TRANSPLANT WITH RENAL DOPPLER ULTRASOUND TECHNIQUE: Ultrasound examination of the renal transplant was performed with gray-scale, color and duplex doppler evaluation. COMPARISON:  None. FINDINGS: Transplant kidney location: Left lower quadrant Transplant Kidney: Renal measurements: 12.5 x 6.4 x 6.7 cm = volume: 29mL. Normal in size and  parenchymal echogenicity. No evidence of mass or hydronephrosis. No peri-transplant fluid collection seen. Color flow in the main renal artery:  Yes Color flow in the main renal vein:  Yes Duplex Doppler Evaluation: Main Renal Artery Resistive Index: 0.79 (normal 0.6-0.8; equivocal 0.8-0.9; abnormal >= 0.9) Venous waveform in main renal vein:  Present Intrarenal resistive index in upper pole:  0.57 (normal 0.6-0.8; equivocal 0.8-0.9; abnormal >= 0.9) Intrarenal resistive index in lower pole: 0.83 (normal 0.6-0.8; equivocal 0.8-0.9; abnormal >= 0.9) Bladder: Normal for degree of bladder distention. Other findings:  None. IMPRESSION: Normal appearance of renal transplant and left lower quadrant. No evidence of hydronephrosis. Unremarkable Doppler ultrasound of renal transplant, with arterial resistive index values within normal limits. Electronically Signed   By: Marlaine Hind M.D.   On: 05/02/2020 14:35   ECHOCARDIOGRAM COMPLETE  Result Date: 05/02/2020  ECHOCARDIOGRAM REPORT   Patient Name:   James Zimmerman Date of Exam: 05/02/2020 Medical Rec #:  [id number]          Height:       72.0 in Accession #:    [id number]         Weight:       250.0 lb Date of Birth:  10-Dec-1951          BSA:          2.343 m Patient Age:    7 years           BP:           109/65 mmHg Patient Gender: M                  HR:           80 bpm. Exam Location:  Inpatient Procedure: 2D Echo Indications:    dyspnea 786.09  History:        Patient has no prior history of Echocardiogram examinations. End                 stage renal disease; Risk Factors:Diabetes, Dyslipidemia and                 Hypertension.  Sonographer:    Johny Chess Referring Phys: [id number] Index  1. Left ventricular ejection fraction, by estimation, is 60 to 65%. The left ventricle has normal function. The left ventricle has no regional wall motion abnormalities. There is mild left ventricular hypertrophy. Left ventricular diastolic parameters  were normal.  2. Right ventricular systolic function is normal. The right ventricular size is normal.  3. Left atrial size was mildly dilated.  4. The mitral valve is normal in structure. Trivial mitral valve regurgitation. No evidence of mitral stenosis.  5. The aortic valve was not well visualized. Aortic valve regurgitation is mild to moderate. Mild aortic valve stenosis.  6. The inferior vena cava is normal in size with greater than 50% respiratory variability, suggesting right atrial pressure of 3 mmHg. FINDINGS  Left Ventricle: Left ventricular ejection fraction, by estimation, is 60 to 65%. The left ventricle has normal function. The left ventricle has no regional wall motion abnormalities. The left ventricular internal cavity size was normal in size. There is  mild left ventricular hypertrophy. Left ventricular diastolic parameters were normal. Right Ventricle: The right ventricular size is normal. Right vetricular wall thickness was not assessed. Right ventricular systolic function is normal. Left Atrium: Left atrial size was mildly dilated. Right Atrium: Right atrial size was normal in size. Pericardium: There is no evidence of pericardial effusion. Mitral Valve: The mitral valve is normal in structure. Trivial mitral valve regurgitation. No evidence of mitral valve stenosis. Tricuspid Valve: The tricuspid valve is normal in structure. Tricuspid valve regurgitation is not demonstrated. No evidence of tricuspid stenosis. Aortic Valve: The aortic valve was not well visualized. Aortic valve regurgitation is mild to moderate. Aortic regurgitation PHT measures 224 msec. Mild aortic stenosis is present. Aortic valve mean gradient measures 13.0 mmHg. Aortic valve peak gradient  measures 22.8 mmHg. Aortic valve area, by VTI measures 2.03 cm. Pulmonic Valve: The pulmonic valve was not well visualized. Pulmonic valve regurgitation is not visualized. No evidence of pulmonic stenosis. Aorta: The aortic root is normal  in size and structure. Pulmonary Artery: Indeterminant PASP, inadequate TR jet. Venous: The inferior vena cava is normal in size with greater than 50% respiratory variability, suggesting right  atrial pressure of 3 mmHg. IAS/Shunts: No atrial level shunt detected by color flow Doppler.  LEFT VENTRICLE PLAX 2D LVIDd:         5.10 cm  Diastology LVIDs:         3.70 cm  LV e' lateral:   10.30 cm/s LV PW:         1.20 cm  LV E/e' lateral: 7.6 LV IVS:        1.20 cm  LV e' medial:    8.05 cm/s LVOT diam:     2.50 cm  LV E/e' medial:  9.7 LV SV:         99 LV SV Index:   42 LVOT Area:     4.91 cm  RIGHT VENTRICLE             IVC RV S prime:     15.90 cm/s  IVC diam: 1.90 cm TAPSE (M-mode): 1.9 cm LEFT ATRIUM              Index       RIGHT ATRIUM           Index LA diam:        4.70 cm  2.01 cm/m  RA Area:     18.70 cm LA Vol (A2C):   102.0 ml 43.54 ml/m RA Volume:   51.00 ml  21.77 ml/m LA Vol (A4C):   77.1 ml  32.91 ml/m LA Biplane Vol: 91.4 ml  39.02 ml/m  AORTIC VALVE AV Area (Vmax):    2.05 cm AV Area (Vmean):   1.79 cm AV Area (VTI):     2.03 cm AV Vmax:           239.00 cm/s AV Vmean:          171.000 cm/s AV VTI:            0.487 m AV Peak Grad:      22.8 mmHg AV Mean Grad:      13.0 mmHg LVOT Vmax:         99.79 cm/s LVOT Vmean:        62.430 cm/s LVOT VTI:          0.201 m LVOT/AV VTI ratio: 0.41 AI PHT:            224 msec  AORTA Ao Root diam: 3.30 cm MITRAL VALVE MV Area (PHT): 3.91 cm    SHUNTS MV Decel Time: 194 msec    Systemic VTI:  0.20 m MV E velocity: 78.40 cm/s  Systemic Diam: 2.50 cm MV A velocity: 58.70 cm/s MV E/A ratio:  1.34 Carlyle Dolly MD Electronically signed by Carlyle Dolly MD Signature Date/Time: 05/02/2020/4:54:28 PM    Final    Medications: Infusions: . cefTRIAXone (ROCEPHIN)  IV    . lactated ringers 125 mL/hr at 05/03/20 0400    Scheduled Medications: . allopurinol  200 mg Oral Daily  . budesonide (PULMICORT) nebulizer solution  0.25 mg Nebulization BID  .  FLUoxetine  40 mg Oral Daily  . folic acid  1 mg Oral Daily  . heparin  5,000 Units Subcutaneous Q8H  . hydrocortisone sod succinate (SOLU-CORTEF) inj  50 mg Intravenous Q6H  . insulin aspart  0-6 Units Subcutaneous TID WC  . ipratropium-albuterol  3 mL Nebulization BID  . multivitamin with minerals  1 tablet Oral Daily  . mycophenolate  250 mg Oral BID  . pantoprazole  20 mg Oral q morning - 10a  . rosuvastatin  20 mg Oral Daily  . sodium chloride flush  3 mL Intravenous Once  . tacrolimus  0.5 mg Oral BID  . tacrolimus  1 mg Oral BID  . thiamine  100 mg Oral Daily   Or  . thiamine  100 mg Intravenous Daily    have reviewed scheduled and prn medications.  Physical Exam: General: very somnolent-  But no c/o's Heart: RRR Lungs: clear Abdomen: distended- non tender Extremities: no edema    05/03/2020,7:52 AM  LOS: 1 day

## 2020-05-03 NOTE — Plan of Care (Signed)
  Problem: Health Behavior/Discharge Planning: Goal: Ability to manage health-related needs will improve Outcome: Progressing   Problem: Education: Goal: Knowledge of General Education information will improve Description: Including pain rating scale, medication(s)/side effects and non-pharmacologic comfort measures Outcome: Progressing   Problem: Clinical Measurements: Goal: Ability to maintain clinical measurements within normal limits will improve Outcome: Progressing Goal: Will remain free from infection Outcome: Progressing Goal: Diagnostic test results will improve Outcome: Progressing Goal: Respiratory complications will improve Outcome: Progressing Goal: Cardiovascular complication will be avoided Outcome: Progressing   Problem: Activity: Goal: Risk for activity intolerance will decrease Outcome: Progressing   Problem: Nutrition: Goal: Adequate nutrition will be maintained Outcome: Progressing   Problem: Coping: Goal: Level of anxiety will decrease Outcome: Progressing   Problem: Elimination: Goal: Will not experience complications related to bowel motility Outcome: Progressing Goal: Will not experience complications related to urinary retention Outcome: Progressing   Problem: Pain Managment: Goal: General experience of comfort will improve Outcome: Progressing   Problem: Safety: Goal: Ability to remain free from injury will improve Outcome: Progressing   Problem: Skin Integrity: Goal: Risk for impaired skin integrity will decrease Outcome: Progressing   

## 2020-05-03 NOTE — Progress Notes (Signed)
Patient refused CPAP.

## 2020-05-04 LAB — HIV ANTIBODY (ROUTINE TESTING W REFLEX): HIV Screen 4th Generation wRfx: NONREACTIVE

## 2020-05-04 LAB — CBC
HCT: 33.6 % — ABNORMAL LOW (ref 39.0–52.0)
Hemoglobin: 10.8 g/dL — ABNORMAL LOW (ref 13.0–17.0)
MCH: 31.8 pg (ref 26.0–34.0)
MCHC: 32.1 g/dL (ref 30.0–36.0)
MCV: 98.8 fL (ref 80.0–100.0)
Platelets: 126 10*3/uL — ABNORMAL LOW (ref 150–400)
RBC: 3.4 MIL/uL — ABNORMAL LOW (ref 4.22–5.81)
RDW: 14.5 % (ref 11.5–15.5)
WBC: 6.8 10*3/uL (ref 4.0–10.5)
nRBC: 0 % (ref 0.0–0.2)

## 2020-05-04 LAB — RENAL FUNCTION PANEL
Albumin: 2.7 g/dL — ABNORMAL LOW (ref 3.5–5.0)
Anion gap: 15 (ref 5–15)
BUN: 45 mg/dL — ABNORMAL HIGH (ref 8–23)
CO2: 15 mmol/L — ABNORMAL LOW (ref 22–32)
Calcium: 8.4 mg/dL — ABNORMAL LOW (ref 8.9–10.3)
Chloride: 105 mmol/L (ref 98–111)
Creatinine, Ser: 2.34 mg/dL — ABNORMAL HIGH (ref 0.61–1.24)
GFR calc Af Amer: 32 mL/min — ABNORMAL LOW (ref >60)
GFR calc non Af Amer: 27 mL/min — ABNORMAL LOW (ref >60)
Glucose, Bld: 207 mg/dL — ABNORMAL HIGH (ref 70–99)
Phosphorus: 3.6 mg/dL (ref 2.5–4.6)
Potassium: 5.8 mmol/L — ABNORMAL HIGH (ref 3.5–5.1)
Sodium: 135 mmol/L (ref 135–145)

## 2020-05-04 LAB — GLUCOSE, CAPILLARY
Glucose-Capillary: 209 mg/dL — ABNORMAL HIGH (ref 70–99)
Glucose-Capillary: 241 mg/dL — ABNORMAL HIGH (ref 70–99)
Glucose-Capillary: 219 mg/dL — ABNORMAL HIGH (ref 70–99)
Glucose-Capillary: 175 mg/dL — ABNORMAL HIGH (ref 70–99)

## 2020-05-04 MED ORDER — DILTIAZEM HCL-DEXTROSE 125-5 MG/125ML-% IV SOLN (PREMIX)
5.0000 mg/h | INTRAVENOUS | Status: DC
  Administered 2020-05-04 – 2020-05-05 (×2): 5 mg/h via INTRAVENOUS
  Administered 2020-05-06: 15 mg/h via INTRAVENOUS
  Filled 2020-05-04 (×5): qty 125

## 2020-05-04 MED ORDER — DILTIAZEM LOAD VIA INFUSION
10.0000 mg | Freq: Once | INTRAVENOUS | Status: AC
  Administered 2020-05-04: 10 mg via INTRAVENOUS
  Filled 2020-05-04: qty 10

## 2020-05-04 MED ORDER — SODIUM ZIRCONIUM CYCLOSILICATE 10 G PO PACK
10.0000 g | PACK | Freq: Once | ORAL | Status: AC
  Administered 2020-05-04: 10 g via ORAL
  Filled 2020-05-04: qty 1

## 2020-05-04 MED ORDER — APIXABAN 5 MG PO TABS
5.0000 mg | ORAL_TABLET | Freq: Two times a day (BID) | ORAL | Status: DC
  Administered 2020-05-04: 5 mg via ORAL
  Filled 2020-05-04: qty 1

## 2020-05-04 MED ORDER — PREDNISONE 5 MG PO TABS
5.0000 mg | ORAL_TABLET | Freq: Every day | ORAL | Status: DC
  Filled 2020-05-04: qty 1

## 2020-05-04 MED ORDER — METOPROLOL TARTRATE 50 MG PO TABS
50.0000 mg | ORAL_TABLET | Freq: Two times a day (BID) | ORAL | Status: DC
  Administered 2020-05-04 (×2): 50 mg via ORAL
  Filled 2020-05-04 (×2): qty 1

## 2020-05-04 MED ORDER — AMIODARONE HCL 200 MG PO TABS
400.0000 mg | ORAL_TABLET | Freq: Two times a day (BID) | ORAL | Status: DC
  Administered 2020-05-04: 400 mg via ORAL
  Filled 2020-05-04: qty 2

## 2020-05-04 NOTE — Consult Note (Signed)
CARDIOLOGY CONSULT NOTE  Patient ID: James Zimmerman MRN: [id number] DOB/AGE: 02-Aug-1951 69 y.o.  Admit date: 05/02/2020 Referring Physician  Antonieta Pert, MD Primary Physician:  [name], MD Reason for Consultation  A. Fib  Patient ID: James Zimmerman, male    DOB: [birth year], 69 y.o.   MRN: [id number]  No chief complaint on file.  HPI:    James Zimmerman  is a 69 y.o. Caucasian male with polycystic kidney disease SP kidney transplant in 2004, 60+ pack year history of tobacco use in the past which she quit sometime in 1980, alcohol abuse, presently drinking about 28 standard drinks of alcohol per week, diabetes mellitus, diabetic retinopathy, admitted to the hospital with acute kidney injury and urosepsis, initially admitted at [facility] in [place] when he presented with urinary symptoms but signed out AMA 2 days later as he was not feeling better.  On admission here patient was found to be in atrial fibrillation with rapid ventricular response and I was asked to see the patient.  Patient went into A. fib with RVR this afternoon.  Patient is asymptomatic with regard this.  Past Medical History:  Diagnosis Date  . Anxiety and depression    crowds, closed in spaces, used to sleep, prn  . Chronic renal insufficiency, stage III (moderate)    polycystic kidney dz. (followed by Dr. Patel/Nephrology)--kidney transplant 2004.  Cr baseline as of 02/2020 is 1.6-1.9 (GFR 35-22m./min).  . DDD (degenerative disc disease), lumbar   . Deep vein thrombosis (DVT) of left upper extremity (HCC)    due to PICC line  . Diabetes mellitus with complication (HCC)    Mild non-proliferative diab retinopathy OS.  Hba1c at nephrologist's 02/18/18 was 5.8%. A1c at nephrol 02/2020 is 5.7%.  . Diverticulosis    + hx of 'itis  . Gastritis 01/2017   likely due to alcohol  . GERD (gastroesophageal reflux disease)   . Glaucoma    severe  . Gout   . H/O nonmelanoma skin cancer    SCC  (chest, face, head)--s/p Mohs 2009.  BCC-s/p Mohs 2021  . H/O polycystic kidney disease   . History of depression   . History of kidney transplant    2004   . History of retinal detachment    Dr. Baird Cancer  . Hx of adenomatous colonic polyps 02/2012; 05/2014; 08/2017   2013 and 2015 tubular adenomas (Dr. Oletta Lamas). 08/2017 NO POLYPS->recall 5 yrs.  . Hyperlipemia, mixed    Lipids at goal 02/2020  . Hypertension   . Osteoarthritis    s/p R THR   Past Surgical History:  Procedure Laterality Date  . COLONOSCOPY  06/10/14; 09/05/17   2015: Tubular adenoma x 4: recall 3 yrs (Dr. Alferd Apa [facility]). 08/2017 NO POLYPS. Recall 5 yrs.  . COLONOSCOPY  03/19/2012   tubular adenoma x 1; diverticulosis, int hem.Procedure: COLONOSCOPY;  Surgeon: Winfield Cunas., MD;  Location: [facility] ENDOSCOPY;  Service: Endoscopy;  Laterality: N/A;  . HOT HEMOSTASIS  03/19/2012   Procedure: HOT HEMOSTASIS (ARGON PLASMA COAGULATION/BICAP);  Surgeon: Winfield Cunas., MD;  Location: [facility] ENDOSCOPY;  Service: Endoscopy;  Laterality: N/A;  . KIDNEY TRANSPLANT  2004  . NOSE SURGERY  09/03/2019   skin cancer  . TOTAL HIP ARTHROPLASTY  12/10/2012   Procedure: TOTAL HIP ARTHROPLASTY ANTERIOR APPROACH;  Surgeon: Mauri Pole, MD;  Location: [facility] ORS;  Service: Orthopedics;  Laterality: Right;   Social History   Socioeconomic History  . Marital  status: Married    Spouse name: Not on file  . Number of children: Not on file  . Years of education: Not on file  . Highest education level: Not on file  Occupational History  . Not on file  Tobacco Use  . Smoking status: Former Smoker    Packs/day: 2.00    Years: 30.00    Pack years: 60.00    Types: Cigarettes    Quit date: 12/26/1983    Years since quitting: 36.3  . Smokeless tobacco: Never Used  Substance and Sexual Activity  . Alcohol use: Yes    Alcohol/week: 28.0 standard drinks    Types: 28 Glasses of wine per week    Comment: every day 3 glasses of wine  . Drug use:  No  . Sexual activity: Not on file  Other Topics Concern  . Not on file  Social History Narrative   Married, one son.   Occupation: retired Merchant navy officer.   Orig from [place].   Former smoker x 20 yrs, quit in 49s.   Alcohol: 3 glasses of wine nightly, occ beer, rare hard liquor.   Social Determinants of Health   Financial Resource Strain:   . Difficulty of Paying Living Expenses:   Food Insecurity:   . Worried About Charity fundraiser in the Last Year:   . Arboriculturist in the Last Year:   Transportation Needs:   . Film/video editor (Medical):   Marland Kitchen Lack of Transportation (Non-Medical):   Physical Activity:   . Days of Exercise per Week:   . Minutes of Exercise per Session:   Stress:   . Feeling of Stress :   Social Connections:   . Frequency of Communication with Friends and Family:   . Frequency of Social Gatherings with Friends and Family:   . Attends Religious Services:   . Active Member of Clubs or Organizations:   . Attends Archivist Meetings:   Marland Kitchen Marital Status:   Intimate Partner Violence:   . Fear of Current or Ex-Partner:   . Emotionally Abused:   Marland Kitchen Physically Abused:   . Sexually Abused:    ROS  Review of Systems  Constitution: Positive for malaise/fatigue.  Cardiovascular: Negative for chest pain, dyspnea on exertion and leg swelling.  Musculoskeletal: Positive for joint pain and muscle weakness.  Gastrointestinal: Negative for melena.  All other systems reviewed and are negative.  Objective   Vitals with BMI 05/04/2020 05/04/2020 05/04/2020  Height - - -  Weight - - -  BMI - - -  Systolic [id number]  Diastolic 87 84 85  Pulse - 100 94    Blood pressure 127/87, pulse 100, temperature 98.1 F (36.7 C), temperature source Axillary, resp. rate (!) 25, height 6' (1.829 m), weight 117.5 kg, SpO2 100 %.    Physical Exam  Cardiovascular: Normal heart sounds, intact distal pulses and normal pulses. An irregularly irregular rhythm  present. Tachycardia present. Exam reveals no gallop.  No murmur heard. No leg edema, no JVD.  Pulmonary/Chest: Effort normal and breath sounds normal.  Abdominal: Soft. Bowel sounds are normal.   Laboratory examination:   Recent Labs    05/02/20 1207 05/02/20 1251 05/02/20 1844 05/03/20 0219 05/04/20 0210  NA 128*   < > 126* 129* 135  K 4.6   < > 5.8* 5.3* 5.8*  CL 98  --   --  99 105  CO2 18*  --   --  16* 15*  GLUCOSE 62*  --   --  189* 207*  BUN 49*  --   --  53* 45*  CREATININE 3.33*  --   --  3.08* 2.34*  CALCIUM 8.6*  --   --  8.3* 8.4*  GFRNONAA 18*  --   --  20* 27*  GFRAA 21*  --   --  23* 32*   < > = values in this interval not displayed.   estimated creatinine clearance is 39.4 mL/min (A) (by C-G formula based on SCr of 2.34 mg/dL (H)).  CMP Latest Ref Rng & Units 05/04/2020 05/03/2020 05/02/2020  Glucose 70 - 99 mg/dL 207(H) 189(H) -  BUN 8 - 23 mg/dL 45(H) 53(H) -  Creatinine 0.61 - 1.24 mg/dL 2.34(H) 3.08(H) -  Sodium 135 - 145 mmol/L 135 129(L) 126(L)  Potassium 3.5 - 5.1 mmol/L 5.8(H) 5.3(H) 5.8(H)  Chloride 98 - 111 mmol/L 105 99 -  CO2 22 - 32 mmol/L 15(L) 16(L) -  Calcium 8.9 - 10.3 mg/dL 8.4(L) 8.3(L) -  Total Protein 6.5 - 8.1 g/dL - 5.7(L) -  Total Bilirubin 0.3 - 1.2 mg/dL - 1.5(H) -  Alkaline Phos 38 - 126 U/L - 74 -  AST 15 - 41 U/L - 80(H) -  ALT 0 - 44 U/L - 42 -   CBC Latest Ref Rng & Units 05/04/2020 05/03/2020 05/02/2020  WBC 4.0 - 10.5 K/uL 6.8 6.8 -  Hemoglobin 13.0 - 17.0 g/dL 10.8(L) 11.3(L) 10.5(L)  Hematocrit 39.0 - 52.0 % 33.6(L) 34.8(L) 31.0(L)  Platelets 150 - 400 K/uL 126(L) 106(L) -   Lipid Panel     Component Value Date/Time   CHOL 157 02/27/2020 0000   TRIG 184 (A) 02/27/2020 0000   HDL 55 02/27/2020 0000   CHOLHDL 3 06/23/2019 0910   VLDL 49.2 (H) 06/23/2019 0910   LDLCALC 71 02/27/2020 0000   LDLDIRECT 91.0 06/23/2019 0910   HEMOGLOBIN A1C Lab Results  Component Value Date   HGBA1C 5.9 (H) 05/02/2020   MPG 122.63  05/02/2020   TSH No results for input(s): TSH in the last 8760 hours. BNP (last 3 results) No results for input(s): BNP in the last 8760 hours.  Medications and allergies   Allergies  Allergen Reactions  . Lipitor [Atorvastatin] Other (See Comments)    Headaches  . Zocor [Simvastatin] Other (See Comments)    Myalgia  . Penicillins Rash    Scheduled Meds: . allopurinol  200 mg Oral Daily  . amiodarone  400 mg Oral BID  . apixaban  5 mg Oral BID  . budesonide (PULMICORT) nebulizer solution  0.25 mg Nebulization BID  . FLUoxetine  40 mg Oral Daily  . folic acid  1 mg Oral Daily  . insulin aspart  0-6 Units Subcutaneous TID WC  . metoprolol tartrate  50 mg Oral BID  . multivitamin with minerals  1 tablet Oral Daily  . mycophenolate  250 mg Oral BID  . pantoprazole  20 mg Oral q morning - 10a  . [START ON 05/05/2020] predniSONE  5 mg Oral Q breakfast  . rosuvastatin  20 mg Oral Daily  . sodium bicarbonate  650 mg Oral BID  . sodium chloride flush  3 mL Intravenous Once  . tacrolimus  1.5 mg Oral BID  . thiamine  100 mg Oral Daily   Or  . thiamine  100 mg Intravenous Daily   Continuous Infusions: . cefTRIAXone (ROCEPHIN)  IV 1 g (05/04/20 2028)  . diltiazem (CARDIZEM) infusion  12.5 mg/hr (05/04/20 2011)   PRN Meds:.acetaminophen **OR** acetaminophen, ALPRAZolam, ipratropium-albuterol, labetalol, LORazepam **OR** LORazepam  . cefTRIAXone (ROCEPHIN)  IV 1 g (05/03/20 1605)  . diltiazem (CARDIZEM) infusion 12.5 mg/hr (05/04/20 2011)    Current Outpatient Medications  Medication Instructions  . ACCU-CHEK SMARTVIEW test strip USE TO TEST BLOOD SUGAR EVERY DAY  . allopurinol (ZYLOPRIM) 200 mg, Oral, Daily  . ALPRAZolam (XANAX) 0.25 mg, Oral, Daily PRN  . amLODipine (NORVASC) 5 mg, Oral, Daily  . FLUoxetine (PROZAC) 40 mg, Oral, Daily, Patient takes in am  . glipiZIDE (GLUCOTROL) 5 mg, Oral, See admin instructions, Take 10mg  in the morning, and 5mg  in the evening.  Marland Kitchen  MAGnesium-Oxide 400 mg, Oral, Daily  . metoprolol tartrate (LOPRESSOR) 50 mg, Oral, 2 times daily  . mycophenolate (CELLCEPT) 250 mg, Oral, 2 times daily  . pantoprazole (PROTONIX) 20 mg, Oral,  Every morning - 10a  . pioglitazone (ACTOS) 45 mg, Oral, Daily  . predniSONE (DELTASONE) 5 mg, Oral, Daily  . rosuvastatin (CRESTOR) 20 MG tablet TAKE 1 TABLET BY MOUTH EVERY DAY  . tacrolimus (PROGRAF) 0.5 MG capsule 1 capsule, Oral, 2 times daily  . tacrolimus (PROGRAF) 1 mg, Oral, 2 times daily  . ZIOPTAN 0.0015 % SOLN 1 drop, Both Eyes, Daily at bedtime    I/O last 3 completed shifts: In: 249.7 [I.V.:249.7] Out: 3375 [Urine:3375] No intake/output data recorded.    Radiology:   Imaging: DG Chest Port 1 View  Result Date: 05/03/2020 CLINICAL DATA:  68 year old male with increase shortness of breath, decreased oxygen saturation, lethargy. EXAM: PORTABLE CHEST 1 VIEW COMPARISON:  Chest radiographs 07/27/2012 and earlier. FINDINGS: Portable AP semi upright view at 1213 hours. Lung volumes and mediastinal contours remain within normal limits. Visualized tracheal air column is within normal limits. No pneumothorax. Allowing for portable technique the lungs are clear. No acute osseous abnormality identified. IMPRESSION: Negative portable chest. Electronically Signed   By: Genevie Ann M.D.   On: 05/03/2020 12:22   Cardiac Studies:   Echocardiogram 05/02/2020: 1. Left ventricular ejection fraction, by estimation, is 60 to 65%. The left ventricle has normal function. The left ventricle has no regional  wall motion abnormalities. There is mild left ventricular hypertrophy. Left ventricular diastolic parameters were normal.  2. Right ventricular systolic function is normal. The right ventricular size is normal.  3. Left atrial size was mildly dilated.  4. The mitral valve is normal in structure. Trivial mitral valve regurgitation. No evidence of mitral stenosis.  5. The aortic valve was not well  visualized. Aortic valve regurgitation is mild to moderate. Mild aortic valve stenosis.  6. The inferior vena cava is normal in size with greater than 50% respiratory variability, suggesting right atrial pressure of 3 mmHg.  EKG:  EKG 05/04/2020: Normal sinus rhythm at rate of 79 bpm, normal axis.  Poor R wave progression, cannot exclude anteroseptal infarct old.  Normal QT interval.  No evidence of ischemia.  No significant change from 05/02/2020.  Assessment   1.  A. fib with RVR CHA2DS2-VASc Score is 4.  Yearly risk of stroke: 4% (A, HTN, DM Vasc Dz - Ao athero by CT).    2.  Hypertension 3.  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  Acute on chronic kidney disease stage IIIb  Recommendations:   Patient's blood pressure is soft.  Patient presently on IV diltiazem 12.5 mg an hour along with metoprolol tartrate 50 mg twice daily.  Unable to uptitrate any further,  I will start him on amiodarone 400 mg p.o. twice daily.  Will avoid the oxygen in view of renal failure.  In view of high cardioembolic risk, he will need to be on anticoagulation.  I have requested pharmacy to consult on him to see whether we can start him on Eliquis or Xarelto.  He does have significant cardiovascular factors that includes diabetes mellitus with retinopathy and chronic kidney disease and will need cardiac for stratification from coronary artery disease when stable.  Adrian Prows, MD, [facility] 05/04/2020, 8:22 PM [facility] [place] Pager: [phone] Office: [phone]

## 2020-05-04 NOTE — Progress Notes (Signed)
ANTICOAGULATION CONSULT NOTE - Initial Consult  Pharmacy Consult:  Eliquis Indication: atrial fibrillation  Allergies  Allergen Reactions  . Lipitor [Atorvastatin] Other (See Comments)    Headaches  . Zocor [Simvastatin] Other (See Comments)    Myalgia  . Penicillins Rash    Patient Measurements: Height: 6' (182.9 cm) Weight: 117.5 kg (259 lb 0.7 oz) IBW/kg (Calculated) : 77.6  Vital Signs: Temp: 98.1 F (36.7 C) (05/11 1910) Temp Source: Axillary (05/11 1910) BP: 128/84 (05/11 2000) Pulse Rate: 133 (05/11 2000)  Labs: Recent Labs    05/02/20 1207 05/02/20 1251 05/02/20 1704 05/02/20 1844 05/02/20 1844 05/03/20 0219 05/04/20 0210 05/04/20 0524  HGB 11.5*   < >  --  10.5*   < > 11.3*  --  10.8*  HCT 36.3*   < >  --  31.0*  --  34.8*  --  33.6*  PLT 107*  --   --   --   --  106*  --  126*  APTT  --   --  46*  --   --   --   --   --   LABPROT 14.2  --   --   --   --   --   --   --   INR 1.1  --   --   --   --   --   --   --   CREATININE 3.33*  --   --   --   --  3.08* 2.34*  --    < > = values in this interval not displayed.    Estimated Creatinine Clearance: 39.4 mL/min (A) (by C-G formula based on SCr of 2.34 mg/dL (H)).   Medical History: Past Medical History:  Diagnosis Date  . Anxiety and depression    crowds, closed in spaces, used to sleep, prn  . Chronic renal insufficiency, stage III (moderate)    polycystic kidney dz. (followed by Dr. Patel/Nephrology)--kidney transplant 2004.  Cr baseline as of 02/2020 is 1.6-1.9 (GFR 35-26m./min).  . DDD (degenerative disc disease), lumbar   . Deep vein thrombosis (DVT) of left upper extremity (HCC)    due to PICC line  . Diabetes mellitus with complication (HCC)    Mild non-proliferative diab retinopathy OS.  Hba1c at nephrologist's 02/18/18 was 5.8%. A1c at nephrol 02/2020 is 5.7%.  . Diverticulosis    + hx of 'itis  . Gastritis 01/2017   likely due to alcohol  . GERD (gastroesophageal reflux disease)   .  Glaucoma    severe  . Gout   . H/O nonmelanoma skin cancer    SCC (chest, face, head)--s/p Mohs 2009.  BCC-s/p Mohs 2021  . H/O polycystic kidney disease   . History of depression   . History of kidney transplant    2004   . History of retinal detachment    Dr. Baird Cancer  . Hx of adenomatous colonic polyps 02/2012; 05/2014; 08/2017   2013 and 2015 tubular adenomas (Dr. Oletta Lamas). 08/2017 NO POLYPS->recall 5 yrs.  . Hyperlipemia, mixed    Lipids at goal 02/2020  . Hypertension   . Osteoarthritis    s/p R THR     Assessment: 10 YOM with new-onset Afib with RVR (CHADSVASC 5) to start anticoagulation with Eliquis or Xarelto.  Opted for Eliquis instead of Xarelto given baseline CKD.  Patient qualifies for full dosing since age < 180 and weight > 60 kg.  Platelet count is low, but no bleeding documented.  Goal of Therapy:  Appropriate anticoagulation Monitor platelets by anticoagulation protocol: Yes   Plan:  D/C heparin SQ Eliquis 5mg  PO BID Pharmacy will sign off and follow peripherally.  Thank you for the consult!  Tabithia Stroder D. Mina Marble, PharmD, BCPS, [place] 05/04/2020, 8:58 PM

## 2020-05-04 NOTE — Progress Notes (Signed)
Subjective:  Hemodynamically stable overnight, moved to 2W-  2700 of UOP- crt down again-  BUN down. K is up a little.  He is formulating thoughts much better- depressed over loss of dog-  Not tolerating being "tied down"  May be thinking he wants to leave AMA  Objective Vital signs in last 24 hours: Vitals:   05/03/20 2243 05/03/20 2300 05/03/20 2319 05/04/20 0310  BP: 130/84  136/71 (!) 133/91  Pulse: 98   99  Resp:  20 20 20   Temp: 98.5 F (36.9 C)  97.8 F (36.6 C) 97.9 F (36.6 C)  TempSrc: Oral  Oral Oral  SpO2:    94%  Weight:      Height:       Weight change:   Intake/Output Summary (Last 24 hours) at 05/04/2020 0739 Last data filed at 05/04/2020 0617 Gross per 24 hour  Intake 249.67 ml  Output 2725 ml  Net -2475.33 ml    Assessment/ Plan: Pt is a 69 y.o. 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 UTI ? Disruption of IS meds?  Assessment/Plan: 1. ?uti?  - also with hematuria.  No culture data yet, CT suggestive of cystitis-  Being treated with rocephin at present 2. A on CRF-  In the setting of above plus concern over volume depletion/contrast-  Less likely rejection-  So far improved with fluids/rocephin.  No indications for dialysis-  oral bicarb - will need to go home on it think 3. S/p transplant-  Placed on IV higher dose steroids -  Cont maintenance prograf/cellcept.  Will stop the high dose steroids due to his high K -  Resume pred 5 mg tomorrow  4. HTN/volume-  Not overloaded-  Giving LR-  Will stop due to potassium.  Think he can replete his fluids orally 5.  Hyponatremia- seeming like hypovolemic vs euvolemia-  Improved  6. Hyperkalemia-  Still riding high-  Will stop LR and high dose steroids-  And also give dose of lokelma today - check in the AM to make sure is down  7. 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   Liver Function Tests: Recent Labs  Lab 05/02/20 1207 05/03/20 0219 05/04/20 0210  AST 89* 80*  --   ALT 40 42  --   ALKPHOS 69 74  --   BILITOT 1.5* 1.5*  --   PROT 5.7* 5.7*  --   ALBUMIN 2.9* 2.8* 2.7*   Recent Labs  Lab 05/02/20 1207  LIPASE 171*  Recent Labs  Lab 05/02/20 1244  AMMONIA 9   CBC: Recent Labs  Lab 05/02/20 1207 05/02/20 1251 05/02/20 1844 05/03/20 0219 05/04/20 0524  WBC 8.7  --   --  6.8 6.8  HGB 11.5*   < > 10.5* 11.3* 10.8*  HCT 36.3*   < > 31.0* 34.8* 33.6*  MCV 99.7  --   --  99.1 98.8  PLT 107*  --   --  106* 126*   < > = values in this interval not displayed.   Cardiac Enzymes: No results for input(s): CKTOTAL, CKMB, CKMBINDEX, TROPONINI in the last 168 hours. CBG: Recent Labs  Lab 05/02/20 2257 05/03/20 0720 05/03/20 1113 05/03/20 1556 05/03/20 2019  GLUCAP 141* 204* 199* 193* 160*    Iron Studies: No results for input(s): IRON, TIBC, TRANSFERRIN, FERRITIN in the last 72 hours. Studies/Results: CT ABDOMEN PELVIS WO CONTRAST  Result Date: 05/02/2020 CLINICAL DATA:  Abdominal pain, nausea, diarrhea. Urinary tract infection. Renal transplant patient. EXAM: CT ABDOMEN AND PELVIS WITHOUT CONTRAST TECHNIQUE: Multidetector CT imaging of the abdomen and pelvis was performed following the standard protocol without IV contrast. COMPARISON:  None. FINDINGS: Lower chest: No acute findings. Hepatobiliary: A 2.6 cm low-attenuation lesion is seen in the left hepatic lobe, and a 1.6  cm lesion is seen in the right hepatic lobe. These cannot be characterized on this unenhanced exam. Gallbladder is unremarkable. No evidence of biliary ductal dilatation. Pancreas: No mass or inflammatory process visualized on this unenhanced exam. Spleen:  Within normal limits in size. Adrenals/Urinary tract: Normal adrenal glands. The native kidneys are markedly enlarged and diffusely involved by numerous cysts, consistent with autosomal dominant polycystic kidney disease. No evidence of ureteral calculi or hydronephrosis. Renal transplant is seen in the left iliac fossa and is unremarkable in appearance. No evidence of hydronephrosis. Urinary bladder shows mild diffuse wall thickening, and cystitis cannot be excluded. Stomach/Bowel: No evidence of obstruction, inflammatory process, or abnormal fluid collections. Normal appendix visualized. Diverticulosis is seen mainly involving the sigmoid colon, however there is no evidence of diverticulitis. Vascular/Lymphatic: No pathologically enlarged lymph nodes identified. No evidence of abdominal aortic aneurysm. Aortic atherosclerosis noted. Reproductive:  No mass or other significant abnormality. Other:  None. Musculoskeletal: No suspicious bone lesions identified. Right hip prosthesis noted. IMPRESSION: 1. Normal appearance of renal transplant on this unenhanced exam. No evidence of hydronephrosis. 2. 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 3. Mild diffuse wall thickening of urinary bladder, which may be due to cystitis. Suggest correlation with urinalysis. 4. Colonic diverticulosis, without radiographic evidence of diverticulitis. 5. Two small low-attenuation liver lesions which cannot be characterized on this unenhanced exam. Consider abdomen MRI without and with contrast for further characterization. Aortic Atherosclerosis (ICD10-I70.0). Electronically Signed   By: Marlaine Hind M.D.   On: 05/02/2020 14:22   US Renal Transplant  w/Doppler  Result Date: 05/02/2020 CLINICAL DATA:  Acute kidney injury. Renal transplant in 2004 for polycystic kidney disease. EXAM: ULTRASOUND OF RENAL TRANSPLANT WITH RENAL DOPPLER ULTRASOUND TECHNIQUE: Ultrasound examination of the renal transplant was performed with gray-scale, color and duplex doppler evaluation. COMPARISON:  None. FINDINGS: Transplant kidney location: Left lower quadrant Transplant Kidney: Renal measurements: 12.5 x 6.4 x 6.7 cm = volume: 232mL. Normal in size and parenchymal echogenicity. No evidence of mass or hydronephrosis. No peri-transplant fluid collection seen. Color flow in the main renal artery:  Yes Color flow in the main renal vein:  Yes Duplex Doppler Evaluation: Main Renal Artery Resistive Index: 0.79 (  normal 0.6-0.8; equivocal 0.8-0.9; abnormal >= 0.9) Venous waveform in main renal vein:  Present Intrarenal resistive index in upper pole:  0.57 (normal 0.6-0.8; equivocal 0.8-0.9; abnormal >= 0.9) Intrarenal resistive index in lower pole: 0.83 (normal 0.6-0.8; equivocal 0.8-0.9; abnormal >= 0.9) Bladder: Normal for degree of bladder distention. Other findings:  None. IMPRESSION: Normal appearance of renal transplant and left lower quadrant. No evidence of hydronephrosis. Unremarkable Doppler ultrasound of renal transplant, with arterial resistive index values within normal limits. Electronically Signed   By: Marlaine Hind M.D.   On: 05/02/2020 14:35   DG Chest Port 1 View  Result Date: 05/03/2020 CLINICAL DATA:  69 year old male with increase shortness of breath, decreased oxygen saturation, lethargy. EXAM: PORTABLE CHEST 1 VIEW COMPARISON:  Chest radiographs 07/27/2012 and earlier. FINDINGS: Portable AP semi upright view at 1213 hours. Lung volumes and mediastinal contours remain within normal limits. Visualized tracheal air column is within normal limits. No pneumothorax. Allowing for portable technique the lungs are clear. No acute osseous abnormality identified.  IMPRESSION: Negative portable chest. Electronically Signed   By: Genevie Ann M.D.   On: 05/03/2020 12:22   ECHOCARDIOGRAM COMPLETE  Result Date: 05/02/2020    ECHOCARDIOGRAM REPORT   Patient Name:   James Zimmerman Date of Exam: 05/02/2020 Medical Rec #:  [id number]          Height:       72.0 in Accession #:    [id number]         Weight:       250.0 lb Date of Birth:  1951-08-19          BSA:          2.343 m Patient Age:    85 years           BP:           109/65 mmHg Patient Gender: M                  HR:           80 bpm. Exam Location:  Inpatient Procedure: 2D Echo Indications:    dyspnea 786.09  History:        Patient has no prior history of Echocardiogram examinations. End                 stage renal disease; Risk Factors:Diabetes, Dyslipidemia and                 Hypertension.  Sonographer:    Johny Chess Referring Phys: [id number] [place]  1. Left ventricular ejection fraction, by estimation, is 60 to 65%. The left ventricle has normal function. The left ventricle has no regional wall motion abnormalities. There is mild left ventricular hypertrophy. Left ventricular diastolic parameters were normal.  2. Right ventricular systolic function is normal. The right ventricular size is normal.  3. Left atrial size was mildly dilated.  4. The mitral valve is normal in structure. Trivial mitral valve regurgitation. No evidence of mitral stenosis.  5. The aortic valve was not well visualized. Aortic valve regurgitation is mild to moderate. Mild aortic valve stenosis.  6. The inferior vena cava is normal in size with greater than 50% respiratory variability, suggesting right atrial pressure of 3 mmHg. FINDINGS  Left Ventricle: Left ventricular ejection fraction, by estimation, is 60 to 65%. The left ventricle has normal function. The left ventricle has no regional wall motion abnormalities. The left ventricular internal cavity size was normal in  size. There is  mild left ventricular hypertrophy.  Left ventricular diastolic parameters were normal. Right Ventricle: The right ventricular size is normal. Right vetricular wall thickness was not assessed. Right ventricular systolic function is normal. Left Atrium: Left atrial size was mildly dilated. Right Atrium: Right atrial size was normal in size. Pericardium: There is no evidence of pericardial effusion. Mitral Valve: The mitral valve is normal in structure. Trivial mitral valve regurgitation. No evidence of mitral valve stenosis. Tricuspid Valve: The tricuspid valve is normal in structure. Tricuspid valve regurgitation is not demonstrated. No evidence of tricuspid stenosis. Aortic Valve: The aortic valve was not well visualized. Aortic valve regurgitation is mild to moderate. Aortic regurgitation PHT measures 224 msec. Mild aortic stenosis is present. Aortic valve mean gradient measures 13.0 mmHg. Aortic valve peak gradient  measures 22.8 mmHg. Aortic valve area, by VTI measures 2.03 cm. Pulmonic Valve: The pulmonic valve was not well visualized. Pulmonic valve regurgitation is not visualized. No evidence of pulmonic stenosis. Aorta: The aortic root is normal in size and structure. Pulmonary Artery: Indeterminant PASP, inadequate TR jet. Venous: The inferior vena cava is normal in size with greater than 50% respiratory variability, suggesting right atrial pressure of 3 mmHg. IAS/Shunts: No atrial level shunt detected by color flow Doppler.  LEFT VENTRICLE PLAX 2D LVIDd:         5.10 cm  Diastology LVIDs:         3.70 cm  LV e' lateral:   10.30 cm/s LV PW:         1.20 cm  LV E/e' lateral: 7.6 LV IVS:        1.20 cm  LV e' medial:    8.05 cm/s LVOT diam:     2.50 cm  LV E/e' medial:  9.7 LV SV:         99 LV SV Index:   42 LVOT Area:     4.91 cm  RIGHT VENTRICLE             IVC RV S prime:     15.90 cm/s  IVC diam: 1.90 cm TAPSE (M-mode): 1.9 cm LEFT ATRIUM              Index       RIGHT ATRIUM           Index LA diam:        4.70 cm  2.01 cm/m  RA Area:      18.70 cm LA Vol (A2C):   102.0 ml 43.54 ml/m RA Volume:   51.00 ml  21.77 ml/m LA Vol (A4C):   77.1 ml  32.91 ml/m LA Biplane Vol: 91.4 ml  39.02 ml/m  AORTIC VALVE AV Area (Vmax):    2.05 cm AV Area (Vmean):   1.79 cm AV Area (VTI):     2.03 cm AV Vmax:           239.00 cm/s AV Vmean:          171.000 cm/s AV VTI:            0.487 m AV Peak Grad:      22.8 mmHg AV Mean Grad:      13.0 mmHg LVOT Vmax:         99.79 cm/s LVOT Vmean:        62.430 cm/s LVOT VTI:          0.201 m LVOT/AV VTI ratio: 0.41 AI PHT:  224 msec  AORTA Ao Root diam: 3.30 cm MITRAL VALVE MV Area (PHT): 3.91 cm    SHUNTS MV Decel Time: 194 msec    Systemic VTI:  0.20 m MV E velocity: 78.40 cm/s  Systemic Diam: 2.50 cm MV A velocity: 58.70 cm/s MV E/A ratio:  1.34 Carlyle Dolly MD Electronically signed by Carlyle Dolly MD Signature Date/Time: 05/02/2020/4:54:28 PM    Final    Medications: Infusions: . cefTRIAXone (ROCEPHIN)  IV 1 g (05/03/20 1605)  . lactated ringers 125 mL/hr at 05/04/20 0239    Scheduled Medications: . allopurinol  200 mg Oral Daily  . budesonide (PULMICORT) nebulizer solution  0.25 mg Nebulization BID  . FLUoxetine  40 mg Oral Daily  . folic acid  1 mg Oral Daily  . heparin  5,000 Units Subcutaneous Q8H  . hydrocortisone sod succinate (SOLU-CORTEF) inj  50 mg Intravenous Q6H  . insulin aspart  0-6 Units Subcutaneous TID WC  . ipratropium-albuterol  3 mL Nebulization BID  . multivitamin with minerals  1 tablet Oral Daily  . mycophenolate  250 mg Oral BID  . pantoprazole  20 mg Oral q morning - 10a  . rosuvastatin  20 mg Oral Daily  . sodium bicarbonate  650 mg Oral BID  . sodium chloride flush  3 mL Intravenous Once  . tacrolimus  1.5 mg Oral BID  . thiamine  100 mg Oral Daily   Or  . thiamine  100 mg Intravenous Daily    have reviewed scheduled and prn medications.  Physical Exam: General: alert, sitting up on side of bed-  Talkative-  Describes his troubles at home and  here Heart: RRR Lungs: clear Abdomen: distended- non tender Extremities: no edema    05/04/2020,7:39 AM  LOS: 2 days

## 2020-05-04 NOTE — Progress Notes (Addendum)
MD notified following prn labetalol with heart rate sustaining. Will update MD in an hour. EKG being obtained now by nurse tech with Rapid response RN at bedside. Patient asymptomatic otherwise.

## 2020-05-04 NOTE — Progress Notes (Signed)
Informed by RN pt tachycardia in 150s. Came and examined at bedside, is asymptomatic. EKG New onset A fib with RVR.Denies chest pain, shortness of breath and palpitations, resting comfortably. Started Cardizem iv 10 mg bolus then infusion, BP is in 130s, consulted with Dr Einar Gip who will see him. Echo few days ago wnl.

## 2020-05-04 NOTE — Progress Notes (Signed)
Brought bedside pulse oximeter monitoring device to patient's bedside for an overnight study.  Explained the reason for the study.  Patient expressed concern about the cost and refused.    RN informed.

## 2020-05-04 NOTE — Progress Notes (Addendum)
PROGRESS NOTE    James Zimmerman  [id number] DOB: 09-21-1951 DOA: 05/02/2020 PCP: [name], MD   Brief Narrative: 69 y.o. male with CKD Stage IIIb ( baseline creat 1.8-1.9 in 10/2019) PCKD s/p kidney transplant in 2004 on maintenance immune modulation meds, IIDM, Gout, EtOH abuse presented with worsening urinary symptoms dysuria/hematuria, episode of confusion and agitation as well as increasing shortness of breath.   Symptoms started about 4 days ago while on a trip to [facility], patient started to have urinary frequency and burning sensations low-grade fever and went to local hospital [facility], when patient was diagnosed with UTI.  Antibiotics was started in the ED, and according patient wife there was image study done in the ED showed normal transplant kidney.  However the next day patient spiked fever 104, and mental status was patient became confused with episodic agitation, antibiotics was switched, without significant improvement in last 2 days. He eventually signed AMA and came to ER at [facility] 05/02/20.Patient also described feeling nauseous and significant decrease of appetite over the last 3 days.  In ED ,appeared to have severe dehydration with borderline hypotension, sodium 128, bicarb 18, glucose 62, BUN 49 creatinine 2.3 compared to 1.8 2 months ago.  WBC 8.7, hemoglobin 11.5.  CT abdomen showed no significant acute changes in her transplanted kidney.  Kidney Doppler within normal limits.  Nephrology was consulted and patient was admitted. Patient is being hydrated IV fluids with slowly improving BUN/creatinine, potassium remains up. Depressed over the loss of his dog.  Subjective: Wife at the bedside, she reports" we are getting there" he has been depressed over the loss of his dog. Afebrile overnight.   Refused CPAP.   Lab with potassium 5.8 BUN creatinine downtrending hco3 at 15  Assessment & Plan:  Sepsis, with altered mental status fever 104 last 2  days PTA and worsening AKI.  Suspected source cystitis CT shows no signs of pyelonephritis, but with mild diffuse wall thickening of urinary bladder. Patient is on ceftriaxone will cont the same. Blood culture/urine culture no growth so far.   AKI on CKD stage IIIb : baseline creatinine 1.8- 1.99 March/2020. appreciate nephrology on board, patient with transplant kidney PCKD status. BUN/creatinine improving.  He remains on home CellCept, tacrolimus and hydrocortisone iv at 50 mg-stopping due to hyperkalemia, resume prednisone 5 mg tomorrow. Cont on IVF as per nephrology. Output good   Intake/Output Summary (Last 24 hours) at 05/04/2020 1110 Last data filed at 05/04/2020 1042 Gross per 24 hour  Intake 0 ml  Output 2500 ml  Net -2500 ml   Recent Labs  Lab 05/02/20 1207 05/03/20 0219 05/04/20 0210  BUN 49* 53* 45*  CREATININE 3.33* 3.08* 2.34*   Hyperkalemia- slightly up today.  IV hydrocortisone to oral prednisone, added Lokelma, repeat BMP in the a.m.  Recent Labs  Lab 05/02/20 1207 05/02/20 1251 05/02/20 1844 05/03/20 0219 05/04/20 0210  K 4.6 4.5 5.8* 5.3* 5.8*   Hyponatremia in the setting of dehydration continue hydration sodium improved w/ IVF. Recent Labs  Lab 05/02/20 1207 05/02/20 1251 05/02/20 1844 05/03/20 0219 05/04/20 0210  NA 128* 128* 126* 129* 135   Non-anion gap metabolic acidosis in the setting of dehydration/CKD, bicarb at 16>15. Cont po bicarb-nephrology advises to discharge on oral bicarb.  Respiratory distress on admission suspected from acidosis.  No known history of COPD or asthma, echo has been ordered-and showed EF 60 to 65% no regional WMA, mild LVH normal left ventricular diastolic parameters  normal RV systolic function. CXR 5/10- WNL. Sleepiness/Snoring during sleep and hypoxia in sleep-checked ABG-pH 7.2 PCO2 45 PO2 59 bicarb 17 indicating metabolic acidosis no hypercapnia. Suspect sleep apnea, patient wife reports that he has been advised to go to  sleep evaluation multiple times but patient refuses.  Patient refused CPAP here.  Order ONO to see if he qualifies for nocturnal home o2.  Again question compliance w o2  Urine retention/dysuria UA abnormal with ketones 5/nitrite negative leukocytes small RBC and WBC more than 50 both: Negative urine culture.   Hypertension blood pressure is controlled. HR transielty in 140s per RN- EKG done is in NSR. Resume hoem metoprolol. Hold other meds  Two small low-attenuation liver lesions consider MRI without and with contrast, will need to advise patient priro to d/c.  Anemia of chronic disease stable.  Alcohol abuse:no symptoms/signs of withdrawal.  CIWA protocol Ativan  Chronic Thrombocytopenia:platelet low but stable. Recent Labs  Lab 05/02/20 1207 05/03/20 0219 05/04/20 0524  PLT 107* 106* 126*   Mild transaminitis:lfts in am  Discussed w RN,to obtain records from [facility]. DVT prophylaxis:Heparin Code Status:FULL Family Communication:Plan of care discussed with patient and his wife at bedside.  Status is: Inpatient. Remains inpatient appropriate because:Ongoing diagnostic testing needed not appropriate for outpatient work up, IV treatments appropriate due to intensity of illness or inability to take PO and Inpatient level of care appropriate due to severity of illness. Dispo:The patient is from:Home            Anticipated d/c is to: Home            Anticipated d/c date is:1-2 days            Patient currently is not medically stable to d/c.  Will need nephrology clearance Diet Order            Diet renal/carb modified with fluid restriction Diet-HS Snack? Nothing; Fluid restriction: 1200 mL Fluid; Room service appropriate? Yes; Fluid consistency: Thin  Diet effective now              Body mass index is 35.13 kg/m. Consultants:see note  Procedures:see note Microbiology:see note  Medications: Scheduled Meds: . allopurinol  200 mg Oral Daily  . budesonide  (PULMICORT) nebulizer solution  0.25 mg Nebulization BID  . FLUoxetine  40 mg Oral Daily  . folic acid  1 mg Oral Daily  . heparin  5,000 Units Subcutaneous Q8H  . insulin aspart  0-6 Units Subcutaneous TID WC  . multivitamin with minerals  1 tablet Oral Daily  . mycophenolate  250 mg Oral BID  . pantoprazole  20 mg Oral q morning - 10a  . [START ON 05/05/2020] predniSONE  5 mg Oral Q breakfast  . rosuvastatin  20 mg Oral Daily  . sodium bicarbonate  650 mg Oral BID  . sodium chloride flush  3 mL Intravenous Once  . tacrolimus  1.5 mg Oral BID  . thiamine  100 mg Oral Daily   Or  . thiamine  100 mg Intravenous Daily   Continuous Infusions: . cefTRIAXone (ROCEPHIN)  IV 1 g (05/03/20 1605)    Antimicrobials: Anti-infectives (From admission, onward)   Start     Dose/Rate Route Frequency Ordered Stop   05/03/20 1600  cefTRIAXone (ROCEPHIN) 1 g in sodium chloride 0.9 % 100 mL IVPB     1 g 200 mL/hr over 30 Minutes Intravenous Every 24 hours 05/02/20 1546     05/02/20 1430  cefTRIAXone (ROCEPHIN)  1 g in sodium chloride 0.9 % 100 mL IVPB     1 g 200 mL/hr over 30 Minutes Intravenous  Once 05/02/20 1427 05/02/20 1620     Objective: Vitals: Today's Vitals   05/03/20 2319 05/04/20 0310 05/04/20 0807 05/04/20 0811  BP: 136/71 (!) 133/91    Pulse:  99    Resp: 20 20    Temp: 97.8 F (36.6 C) 97.9 F (36.6 C)    TempSrc: Oral Oral    SpO2:  94% 93% (!) 89%  Weight:      Height:      PainSc:        Intake/Output Summary (Last 24 hours) at 05/04/2020 1110 Last data filed at 05/04/2020 1042 Gross per 24 hour  Intake 0 ml  Output 2500 ml  Net -2500 ml   Filed Weights   05/02/20 1154 05/02/20 2135  Weight: 113.4 kg 117.5 kg   Weight change:    Intake/Output from previous day: 05/10 0701 - 05/11 0700 In: 249.7 [I.V.:249.7] Out: 2725 [Urine:2725] Intake/Output this shift: Total I/O In: -  Out: 350 [Urine:350]  Examination: General exam:AAO, interactive more today,  sleepy, obese. HEENT:Oral mucosa moist, Ear/Nose WNL grossly,dentition normal. Respiratory system: bilaterally clear,no wheezing or crackles,no use of accessory muscle, non tender. Cardiovascular system: S1 & S2 +, regular, No JVD. Gastrointestinal system: Abdomen soft, NT,ND, BS+. Nervous System:Alert, awake, moving extremities and grossly nonfocal Extremities: No edema, distal peripheral pulses palpable.  Skin: No rashes,no icterus. MSK: Normal muscle bulk,tone, power  Data Reviewed: I have personally reviewed following labs and imaging studies CBC: Recent Labs  Lab 05/02/20 1207 05/02/20 1251 05/02/20 1844 05/03/20 0219 05/04/20 0524  WBC 8.7  --   --  6.8 6.8  HGB 11.5* 11.2* 10.5* 11.3* 10.8*  HCT 36.3* 33.0* 31.0* 34.8* 33.6*  MCV 99.7  --   --  99.1 98.8  PLT 107*  --   --  106* 123XX123*   Basic Metabolic Panel: Recent Labs  Lab 05/02/20 1207 05/02/20 1251 05/02/20 1844 05/03/20 0219 05/04/20 0210  NA 128* 128* 126* 129* 135  K 4.6 4.5 5.8* 5.3* 5.8*  CL 98  --   --  99 105  CO2 18*  --   --  16* 15*  GLUCOSE 62*  --   --  189* 207*  BUN 49*  --   --  53* 45*  CREATININE 3.33*  --   --  3.08* 2.34*  CALCIUM 8.6*  --   --  8.3* 8.4*  PHOS  --   --   --   --  3.6   GFR: Estimated Creatinine Clearance: 39.4 mL/min (A) (by C-G formula based on SCr of 2.34 mg/dL (H)). Liver Function Tests: Recent Labs  Lab 05/02/20 1207 05/03/20 0219 05/04/20 0210  AST 89* 80*  --   ALT 40 42  --   ALKPHOS 69 74  --   BILITOT 1.5* 1.5*  --   PROT 5.7* 5.7*  --   ALBUMIN 2.9* 2.8* 2.7*   Recent Labs  Lab 05/02/20 1207  LIPASE 171*   Recent Labs  Lab 05/02/20 1244  AMMONIA 9   Coagulation Profile: Recent Labs  Lab 05/02/20 1207  INR 1.1   Cardiac Enzymes: No results for input(s): CKTOTAL, CKMB, CKMBINDEX, TROPONINI in the last 168 hours. BNP (last 3 results) No results for input(s): PROBNP in the last 8760 hours. HbA1C: Recent Labs    05/02/20 1704  HGBA1C  5.9*  CBG: Recent Labs  Lab 05/03/20 0720 05/03/20 1113 05/03/20 1556 05/03/20 2019 05/04/20 0757  GLUCAP 204* 199* 193* 160* 209*   Lipid Profile: No results for input(s): CHOL, HDL, LDLCALC, TRIG, CHOLHDL, LDLDIRECT in the last 72 hours. Thyroid Function Tests: No results for input(s): TSH, T4TOTAL, FREET4, T3FREE, THYROIDAB in the last 72 hours. Anemia Panel: No results for input(s): VITAMINB12, FOLATE, FERRITIN, TIBC, IRON, RETICCTPCT in the last 72 hours. Sepsis Labs: No results for input(s): PROCALCITON, LATICACIDVEN in the last 168 hours.  Recent Results (from the past 240 hour(s))  Culture, blood (single) w Reflex to ID Panel     Status: None (Preliminary result)   Collection Time: 05/02/20 12:43 PM   Specimen: BLOOD  Result Value Ref Range Status   Specimen Description BLOOD LEFT ANTECUBITAL  Final   Special Requests   Final    BOTTLES DRAWN AEROBIC AND ANAEROBIC Blood Culture adequate volume   Culture   Final    NO GROWTH 2 DAYS Performed at [facility], [address], [place], [place] [address]    Report Status PENDING  Incomplete  Urine culture     Status: None   Collection Time: 05/02/20 12:44 PM   Specimen: Urine, Random  Result Value Ref Range Status   Specimen Description URINE, RANDOM  Final   Special Requests NONE  Final   Culture   Final    NO GROWTH Performed at [facility], [place] [address] [place], [place] [address]    Report Status 05/03/2020 FINAL  Final  Respiratory Panel by RT PCR (Flu A&B, Covid) - Nasopharyngeal Swab     Status: None   Collection Time: 05/02/20  8:54 PM   Specimen: Nasopharyngeal Swab  Result Value Ref Range Status   SARS Coronavirus 2 by RT PCR NEGATIVE NEGATIVE Final    Comment: (NOTE) SARS-CoV-2 target nucleic acids are NOT DETECTED. The SARS-CoV-2 RNA is generally detectable in upper respiratoy specimens during the acute phase of infection. The lowest concentration of SARS-CoV-2 viral copies this  assay can detect is 131 copies/mL. A negative result does not preclude SARS-Cov-2 infection and should not be used as the sole basis for treatment or other patient management decisions. A negative result may occur with  improper specimen collection/handling, submission of specimen other than nasopharyngeal swab, presence of viral mutation(s) within the areas targeted by this assay, and inadequate number of viral copies (<131 copies/mL). A negative result must be combined with clinical observations, patient history, and epidemiological information. The expected result is Negative. Fact Sheet for Patients:  PinkCheek.be Fact Sheet for Healthcare Providers:  GravelBags.it This test is not yet ap proved or cleared by the [place] FDA and  has been authorized for detection and/or diagnosis of SARS-CoV-2 by FDA under an Emergency Use Authorization (EUA). This EUA will remain  in effect (meaning this test can be used) for the duration of the COVID-19 declaration under Section 564(b)(1) of the Act, 21 U.S.C. section 360bbb-3(b)(1), unless the authorization is terminated or revoked sooner.    Influenza A by PCR NEGATIVE NEGATIVE Final   Influenza B by PCR NEGATIVE NEGATIVE Final    Comment: (NOTE) The Xpert Xpress SARS-CoV-2/FLU/RSV assay is intended as an aid in  the diagnosis of influenza from Nasopharyngeal swab specimens and  should not be used as a sole basis for treatment. Nasal washings and  aspirates are unacceptable for Xpert Xpress SARS-CoV-2/FLU/RSV  testing. Fact Sheet for Patients: PinkCheek.be Fact Sheet for Healthcare Providers:  GravelBags.it This test is not yet approved or cleared by the [place] and  has been authorized for detection and/or diagnosis of SARS-CoV-2 by  FDA under an Emergency Use Authorization (EUA). This EUA will remain  in  effect (meaning this test can be used) for the duration of the  Covid-19 declaration under Section 564(b)(1) of the Act, 21  U.S.C. section 360bbb-3(b)(1), unless the authorization is  terminated or revoked. Performed at [facility], [place] [address] [place], [place] [address]   MRSA PCR Screening     Status: None   Collection Time: 05/02/20  9:23 PM   Specimen: Nasal Mucosa; Nasopharyngeal  Result Value Ref Range Status   MRSA by PCR NEGATIVE NEGATIVE Final    Comment:        The GeneXpert MRSA Assay (FDA approved for NASAL specimens only), is one component of a comprehensive MRSA colonization surveillance program. It is not intended to diagnose MRSA infection nor to guide or monitor treatment for MRSA infections. Performed at [facility], [place] [address], [place], [place] [address]       Radiology Studies: CT ABDOMEN PELVIS WO CONTRAST  Result Date: 05/02/2020 CLINICAL DATA:  Abdominal pain, nausea, diarrhea. Urinary tract infection. Renal transplant patient. EXAM: CT ABDOMEN AND PELVIS WITHOUT CONTRAST TECHNIQUE: Multidetector CT imaging of the abdomen and pelvis was performed following the standard protocol without IV contrast. COMPARISON:  None. FINDINGS: Lower chest: No acute findings. Hepatobiliary: A 2.6 cm low-attenuation lesion is seen in the left hepatic lobe, and a 1.6 cm lesion is seen in the right hepatic lobe. These cannot be characterized on this unenhanced exam. Gallbladder is unremarkable. No evidence of biliary ductal dilatation. Pancreas: No mass or inflammatory process visualized on this unenhanced exam. Spleen:  Within normal limits in size. Adrenals/Urinary tract: Normal adrenal glands. The native kidneys are markedly enlarged and diffusely involved by numerous cysts, consistent with autosomal dominant polycystic kidney disease. No evidence of ureteral calculi or hydronephrosis. Renal transplant is seen in the left iliac fossa and is unremarkable in  appearance. No evidence of hydronephrosis. Urinary bladder shows mild diffuse wall thickening, and cystitis cannot be excluded. Stomach/Bowel: No evidence of obstruction, inflammatory process, or abnormal fluid collections. Normal appendix visualized. Diverticulosis is seen mainly involving the sigmoid colon, however there is no evidence of diverticulitis. Vascular/Lymphatic: No pathologically enlarged lymph nodes identified. No evidence of abdominal aortic aneurysm. Aortic atherosclerosis noted. Reproductive:  No mass or other significant abnormality. Other:  None. Musculoskeletal: No suspicious bone lesions identified. Right hip prosthesis noted. IMPRESSION: 1. Normal appearance of renal transplant on this unenhanced exam. No evidence of hydronephrosis. 2. 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 3. Mild diffuse wall thickening of urinary bladder, which may be due to cystitis. Suggest correlation with urinalysis. 4. Colonic diverticulosis, without radiographic evidence of diverticulitis. 5. Two small low-attenuation liver lesions which cannot be characterized on this unenhanced exam. Consider abdomen MRI without and with contrast for further characterization. Aortic Atherosclerosis (ICD10-I70.0). Electronically Signed   By: Marlaine Hind M.D.   On: 05/02/2020 14:22   US Renal Transplant w/Doppler  Result Date: 05/02/2020 CLINICAL DATA:  Acute kidney injury. Renal transplant in 2004 for polycystic kidney disease. EXAM: ULTRASOUND OF RENAL TRANSPLANT WITH RENAL DOPPLER ULTRASOUND TECHNIQUE: Ultrasound examination of the renal transplant was performed with gray-scale, color and duplex doppler evaluation. COMPARISON:  None. FINDINGS: Transplant kidney location: Left lower quadrant Transplant Kidney: Renal measurements: 12.5 x 6.4 x 6.7 cm =  volume: 272mL. Normal in size and parenchymal echogenicity. No evidence of mass or hydronephrosis. No peri-transplant fluid collection seen. Color  flow in the main renal artery:  Yes Color flow in the main renal vein:  Yes Duplex Doppler Evaluation: Main Renal Artery Resistive Index: 0.79 (normal 0.6-0.8; equivocal 0.8-0.9; abnormal >= 0.9) Venous waveform in main renal vein:  Present Intrarenal resistive index in upper pole:  0.57 (normal 0.6-0.8; equivocal 0.8-0.9; abnormal >= 0.9) Intrarenal resistive index in lower pole: 0.83 (normal 0.6-0.8; equivocal 0.8-0.9; abnormal >= 0.9) Bladder: Normal for degree of bladder distention. Other findings:  None. IMPRESSION: Normal appearance of renal transplant and left lower quadrant. No evidence of hydronephrosis. Unremarkable Doppler ultrasound of renal transplant, with arterial resistive index values within normal limits. Electronically Signed   By: Marlaine Hind M.D.   On: 05/02/2020 14:35   DG Chest Port 1 View  Result Date: 05/03/2020 CLINICAL DATA:  69 year old male with increase shortness of breath, decreased oxygen saturation, lethargy. EXAM: PORTABLE CHEST 1 VIEW COMPARISON:  Chest radiographs 07/27/2012 and earlier. FINDINGS: Portable AP semi upright view at 1213 hours. Lung volumes and mediastinal contours remain within normal limits. Visualized tracheal air column is within normal limits. No pneumothorax. Allowing for portable technique the lungs are clear. No acute osseous abnormality identified. IMPRESSION: Negative portable chest. Electronically Signed   By: Genevie Ann M.D.   On: 05/03/2020 12:22   ECHOCARDIOGRAM COMPLETE  Result Date: 05/02/2020    ECHOCARDIOGRAM REPORT   Patient Name:   James Zimmerman Date of Exam: 05/02/2020 Medical Rec #:  [id number]          Height:       72.0 in Accession #:    [id number]         Weight:       250.0 lb Date of Birth:  04/24/1951          BSA:          2.343 m Patient Age:    56 years           BP:           109/65 mmHg Patient Gender: M                  HR:           80 bpm. Exam Location:  Inpatient Procedure: 2D Echo Indications:    dyspnea 786.09  History:         Patient has no prior history of Echocardiogram examinations. End                 stage renal disease; Risk Factors:Diabetes, Dyslipidemia and                 Hypertension.  Sonographer:    Johny Chess Referring Phys: [id number] [place]  1. Left ventricular ejection fraction, by estimation, is 60 to 65%. The left ventricle has normal function. The left ventricle has no regional wall motion abnormalities. There is mild left ventricular hypertrophy. Left ventricular diastolic parameters were normal.  2. Right ventricular systolic function is normal. The right ventricular size is normal.  3. Left atrial size was mildly dilated.  4. The mitral valve is normal in structure. Trivial mitral valve regurgitation. No evidence of mitral stenosis.  5. The aortic valve was not well visualized. Aortic valve regurgitation is mild to moderate. Mild aortic valve stenosis.  6. The inferior vena cava is normal in size with greater than 50%  respiratory variability, suggesting right atrial pressure of 3 mmHg. FINDINGS  Left Ventricle: Left ventricular ejection fraction, by estimation, is 60 to 65%. The left ventricle has normal function. The left ventricle has no regional wall motion abnormalities. The left ventricular internal cavity size was normal in size. There is  mild left ventricular hypertrophy. Left ventricular diastolic parameters were normal. Right Ventricle: The right ventricular size is normal. Right vetricular wall thickness was not assessed. Right ventricular systolic function is normal. Left Atrium: Left atrial size was mildly dilated. Right Atrium: Right atrial size was normal in size. Pericardium: There is no evidence of pericardial effusion. Mitral Valve: The mitral valve is normal in structure. Trivial mitral valve regurgitation. No evidence of mitral valve stenosis. Tricuspid Valve: The tricuspid valve is normal in structure. Tricuspid valve regurgitation is not demonstrated. No evidence of  tricuspid stenosis. Aortic Valve: The aortic valve was not well visualized. Aortic valve regurgitation is mild to moderate. Aortic regurgitation PHT measures 224 msec. Mild aortic stenosis is present. Aortic valve mean gradient measures 13.0 mmHg. Aortic valve peak gradient  measures 22.8 mmHg. Aortic valve area, by VTI measures 2.03 cm. Pulmonic Valve: The pulmonic valve was not well visualized. Pulmonic valve regurgitation is not visualized. No evidence of pulmonic stenosis. Aorta: The aortic root is normal in size and structure. Pulmonary Artery: Indeterminant PASP, inadequate TR jet. Venous: The inferior vena cava is normal in size with greater than 50% respiratory variability, suggesting right atrial pressure of 3 mmHg. IAS/Shunts: No atrial level shunt detected by color flow Doppler.  LEFT VENTRICLE PLAX 2D LVIDd:         5.10 cm  Diastology LVIDs:         3.70 cm  LV e' lateral:   10.30 cm/s LV PW:         1.20 cm  LV E/e' lateral: 7.6 LV IVS:        1.20 cm  LV e' medial:    8.05 cm/s LVOT diam:     2.50 cm  LV E/e' medial:  9.7 LV SV:         99 LV SV Index:   42 LVOT Area:     4.91 cm  RIGHT VENTRICLE             IVC RV S prime:     15.90 cm/s  IVC diam: 1.90 cm TAPSE (M-mode): 1.9 cm LEFT ATRIUM              Index       RIGHT ATRIUM           Index LA diam:        4.70 cm  2.01 cm/m  RA Area:     18.70 cm LA Vol (A2C):   102.0 ml 43.54 ml/m RA Volume:   51.00 ml  21.77 ml/m LA Vol (A4C):   77.1 ml  32.91 ml/m LA Biplane Vol: 91.4 ml  39.02 ml/m  AORTIC VALVE AV Area (Vmax):    2.05 cm AV Area (Vmean):   1.79 cm AV Area (VTI):     2.03 cm AV Vmax:           239.00 cm/s AV Vmean:          171.000 cm/s AV VTI:            0.487 m AV Peak Grad:      22.8 mmHg AV Mean Grad:      13.0 mmHg LVOT Vmax:  99.79 cm/s LVOT Vmean:        62.430 cm/s LVOT VTI:          0.201 m LVOT/AV VTI ratio: 0.41 AI PHT:            224 msec  AORTA Ao Root diam: 3.30 cm MITRAL VALVE MV Area (PHT): 3.91 cm    SHUNTS  MV Decel Time: 194 msec    Systemic VTI:  0.20 m MV E velocity: 78.40 cm/s  Systemic Diam: 2.50 cm MV A velocity: 58.70 cm/s MV E/A ratio:  1.34 Carlyle Dolly MD Electronically signed by Carlyle Dolly MD Signature Date/Time: 05/02/2020/4:54:28 PM    Final      LOS: 2 days   Time spent: More than 50% of that time was spent in counseling and/or coordination of care.  Antonieta Pert, MD [facility]  05/04/2020, 11:10 AM

## 2020-05-04 NOTE — Significant Event (Signed)
Rapid Response Event Note  Overview: Arrival Time: 1700 Event Type: Cardiac Tachycardia, HR up to 170 bpm.  Initial Focused Assessment: While rounding on unit RN asked me to evaluate patient. Pt lying in bed, not distressed. Pt AOx4, able to follow commands, moves all extremities. Pink, warm, dry. Pt has no complains, denies pain. Apical and radial pulse assessed, irregular. Lung sounds are clear, diminished. No JVD, no edema. Abdomen is round, soft. Bowel sounds +. Vagal maneuver attempted x2 with mild response, HR down to 135 bpm from 150 bpm. Carotid massage attempted with no response.   Pt denies history of cardiac arrhythmia.   Interventions: -EKG -Order received for Cardizem gtt -Established IV access  Plan of Care (if not transferred): -Titrate Cardizem gtt per order to achieve HR goal -Document VS with each titration  Call rapid response for additional needs  Event Summary: Name of Physician Notified: Dr. Lupita Leash at 1700 Outcome: Stayed in room and stabalized Event End Time: 1800(RR RN called to another emergency)  Casimer Bilis

## 2020-05-05 ENCOUNTER — Inpatient Hospital Stay (HOSPITAL_COMMUNITY): Payer: Medicare Other

## 2020-05-05 ENCOUNTER — Inpatient Hospital Stay (HOSPITAL_COMMUNITY): Payer: Medicare Other | Admitting: Certified Registered"

## 2020-05-05 DIAGNOSIS — J9601 Acute respiratory failure with hypoxia: Secondary | ICD-10-CM

## 2020-05-05 LAB — CBC WITH DIFFERENTIAL/PLATELET
Abs Immature Granulocytes: 0.1 10*3/uL — ABNORMAL HIGH (ref 0.00–0.07)
Basophils Absolute: 0 10*3/uL (ref 0.0–0.1)
Basophils Relative: 0 %
Eosinophils Absolute: 0 10*3/uL (ref 0.0–0.5)
Eosinophils Relative: 0 %
HCT: 38.4 % — ABNORMAL LOW (ref 39.0–52.0)
Hemoglobin: 12 g/dL — ABNORMAL LOW (ref 13.0–17.0)
Immature Granulocytes: 1 %
Lymphocytes Relative: 13 %
Lymphs Abs: 1.2 10*3/uL (ref 0.7–4.0)
MCH: 31.8 pg (ref 26.0–34.0)
MCHC: 31.3 g/dL (ref 30.0–36.0)
MCV: 101.9 fL — ABNORMAL HIGH (ref 80.0–100.0)
Monocytes Absolute: 0.7 10*3/uL (ref 0.1–1.0)
Monocytes Relative: 8 %
Neutro Abs: 6.9 10*3/uL (ref 1.7–7.7)
Neutrophils Relative %: 78 %
Platelets: 201 10*3/uL (ref 150–400)
RBC: 3.77 MIL/uL — ABNORMAL LOW (ref 4.22–5.81)
RDW: 14.4 % (ref 11.5–15.5)
WBC: 8.9 10*3/uL (ref 4.0–10.5)
nRBC: 0 % (ref 0.0–0.2)

## 2020-05-05 LAB — RENAL FUNCTION PANEL
Albumin: 2.9 g/dL — ABNORMAL LOW (ref 3.5–5.0)
Anion gap: 11 (ref 5–15)
BUN: 31 mg/dL — ABNORMAL HIGH (ref 8–23)
CO2: 24 mmol/L (ref 22–32)
Calcium: 9.8 mg/dL (ref 8.9–10.3)
Chloride: 105 mmol/L (ref 98–111)
Creatinine, Ser: 2.23 mg/dL — ABNORMAL HIGH (ref 0.61–1.24)
GFR calc Af Amer: 34 mL/min — ABNORMAL LOW (ref >60)
GFR calc non Af Amer: 29 mL/min — ABNORMAL LOW (ref >60)
Glucose, Bld: 278 mg/dL — ABNORMAL HIGH (ref 70–99)
Phosphorus: 3.6 mg/dL (ref 2.5–4.6)
Potassium: 4.2 mmol/L (ref 3.5–5.1)
Sodium: 140 mmol/L (ref 135–145)

## 2020-05-05 LAB — TSH: TSH: 2.579 u[IU]/mL (ref 0.350–4.500)

## 2020-05-05 LAB — COMPREHENSIVE METABOLIC PANEL
ALT: 41 U/L (ref 0–44)
AST: 64 U/L — ABNORMAL HIGH (ref 15–41)
Albumin: 2.9 g/dL — ABNORMAL LOW (ref 3.5–5.0)
Alkaline Phosphatase: 69 U/L (ref 38–126)
Anion gap: 10 (ref 5–15)
BUN: 31 mg/dL — ABNORMAL HIGH (ref 8–23)
CO2: 25 mmol/L (ref 22–32)
Calcium: 9.4 mg/dL (ref 8.9–10.3)
Chloride: 105 mmol/L (ref 98–111)
Creatinine, Ser: 2.22 mg/dL — ABNORMAL HIGH (ref 0.61–1.24)
GFR calc Af Amer: 34 mL/min — ABNORMAL LOW (ref >60)
GFR calc non Af Amer: 29 mL/min — ABNORMAL LOW (ref >60)
Glucose, Bld: 242 mg/dL — ABNORMAL HIGH (ref 70–99)
Potassium: 4.2 mmol/L (ref 3.5–5.1)
Sodium: 140 mmol/L (ref 135–145)
Total Bilirubin: 1.1 mg/dL (ref 0.3–1.2)
Total Protein: 5.6 g/dL — ABNORMAL LOW (ref 6.5–8.1)

## 2020-05-05 LAB — POCT I-STAT 7, (LYTES, BLD GAS, ICA,H+H)
Acid-Base Excess: 2 mmol/L (ref 0.0–2.0)
Bicarbonate: 26.8 mmol/L (ref 20.0–28.0)
Calcium, Ion: 1.42 mmol/L — ABNORMAL HIGH (ref 1.15–1.40)
HCT: 29 % — ABNORMAL LOW (ref 39.0–52.0)
Hemoglobin: 9.9 g/dL — ABNORMAL LOW (ref 13.0–17.0)
O2 Saturation: 95 %
Patient temperature: 98.5
Potassium: 3.7 mmol/L (ref 3.5–5.1)
Sodium: 139 mmol/L (ref 135–145)
TCO2: 28 mmol/L (ref 22–32)
pCO2 arterial: 40.4 mmHg (ref 32.0–48.0)
pH, Arterial: 7.431 (ref 7.350–7.450)
pO2, Arterial: 72 mmHg — ABNORMAL LOW (ref 83.0–108.0)

## 2020-05-05 LAB — GLUCOSE, CAPILLARY
Glucose-Capillary: 144 mg/dL — ABNORMAL HIGH (ref 70–99)
Glucose-Capillary: 155 mg/dL — ABNORMAL HIGH (ref 70–99)
Glucose-Capillary: 133 mg/dL — ABNORMAL HIGH (ref 70–99)
Glucose-Capillary: 137 mg/dL — ABNORMAL HIGH (ref 70–99)
Glucose-Capillary: 163 mg/dL — ABNORMAL HIGH (ref 70–99)

## 2020-05-05 LAB — CBC
HCT: 38.3 % — ABNORMAL LOW (ref 39.0–52.0)
Hemoglobin: 12.1 g/dL — ABNORMAL LOW (ref 13.0–17.0)
MCH: 31.6 pg (ref 26.0–34.0)
MCHC: 31.6 g/dL (ref 30.0–36.0)
MCV: 100 fL (ref 80.0–100.0)
Platelets: 235 10*3/uL (ref 150–400)
RBC: 3.83 MIL/uL — ABNORMAL LOW (ref 4.22–5.81)
RDW: 14.3 % (ref 11.5–15.5)
WBC: 13.2 10*3/uL — ABNORMAL HIGH (ref 4.0–10.5)
nRBC: 0 % (ref 0.0–0.2)

## 2020-05-05 MED ORDER — IPRATROPIUM-ALBUTEROL 0.5-2.5 (3) MG/3ML IN SOLN
3.0000 mL | RESPIRATORY_TRACT | Status: DC
  Administered 2020-05-05 (×5): 3 mL via RESPIRATORY_TRACT
  Filled 2020-05-05 (×5): qty 3

## 2020-05-05 MED ORDER — DEXMEDETOMIDINE HCL IN NACL 400 MCG/100ML IV SOLN
0.0000 ug/kg/h | INTRAVENOUS | Status: DC
  Administered 2020-05-05: 0.2 ug/kg/h via INTRAVENOUS
  Administered 2020-05-05: 17:00:00 0.3 ug/kg/h via INTRAVENOUS
  Administered 2020-05-06: 1.2 ug/kg/h via INTRAVENOUS
  Administered 2020-05-06: 0.6 ug/kg/h via INTRAVENOUS
  Administered 2020-05-06: 0.8 ug/kg/h via INTRAVENOUS
  Filled 2020-05-05 (×4): qty 100

## 2020-05-05 MED ORDER — FENTANYL CITRATE (PF) 100 MCG/2ML IJ SOLN: 25.0000 ug | INTRAMUSCULAR | Status: DC | PRN

## 2020-05-05 MED ORDER — ALLOPURINOL 100 MG PO TABS
200.0000 mg | ORAL_TABLET | Freq: Every day | ORAL | Status: DC
  Administered 2020-05-05: 200 mg
  Filled 2020-05-05 (×2): qty 2

## 2020-05-05 MED ORDER — FOLIC ACID 1 MG PO TABS: 1.0000 mg | ORAL_TABLET | Freq: Every day | ORAL | Status: DC

## 2020-05-05 MED ORDER — CHLORHEXIDINE GLUCONATE CLOTH 2 % EX PADS
6.0000 | MEDICATED_PAD | Freq: Every day | CUTANEOUS | Status: DC
  Administered 2020-05-05 – 2020-05-07 (×2): 6 via TOPICAL

## 2020-05-05 MED ORDER — HALOPERIDOL LACTATE 5 MG/ML IJ SOLN: 5.0000 mg | Freq: Once | INTRAMUSCULAR | Status: AC

## 2020-05-05 MED ORDER — HALOPERIDOL LACTATE 5 MG/ML IJ SOLN
INTRAMUSCULAR | Status: AC
  Administered 2020-05-05: 5 mg via INTRAVENOUS
  Filled 2020-05-05: qty 1

## 2020-05-05 MED ORDER — ACETAMINOPHEN 325 MG PO TABS: 650.0000 mg | ORAL_TABLET | Freq: Four times a day (QID) | ORAL | Status: DC | PRN

## 2020-05-05 MED ORDER — THIAMINE HCL 100 MG PO TABS
100.0000 mg | ORAL_TABLET | Freq: Every day | ORAL | Status: DC
  Administered 2020-05-05: 12:00:00 100 mg
  Filled 2020-05-05 (×2): qty 1

## 2020-05-05 MED ORDER — ACETAMINOPHEN 650 MG RE SUPP: 650.0000 mg | Freq: Four times a day (QID) | RECTAL | Status: DC | PRN

## 2020-05-05 MED ORDER — SODIUM BICARBONATE 650 MG PO TABS
650.0000 mg | ORAL_TABLET | Freq: Two times a day (BID) | ORAL | Status: DC
  Administered 2020-05-05: 22:00:00 650 mg
  Filled 2020-05-05 (×3): qty 1

## 2020-05-05 MED ORDER — THIAMINE HCL 100 MG/ML IJ SOLN: 100.0000 mg | Freq: Every day | INTRAMUSCULAR | Status: DC

## 2020-05-05 MED ORDER — METOPROLOL TARTRATE 50 MG PO TABS
50.0000 mg | ORAL_TABLET | Freq: Two times a day (BID) | ORAL | Status: DC
  Administered 2020-05-05: 50 mg
  Filled 2020-05-05 (×3): qty 1

## 2020-05-05 MED ORDER — LACTATED RINGERS IV BOLUS
1000.0000 mL | Freq: Once | INTRAVENOUS | Status: AC
  Administered 2020-05-05: 14:00:00 1000 mL via INTRAVENOUS

## 2020-05-05 MED ORDER — LACTATED RINGERS IV BOLUS
1000.0000 mL | Freq: Once | INTRAVENOUS | Status: AC
  Administered 2020-05-05: 1000 mL via INTRAVENOUS

## 2020-05-05 MED ORDER — ORAL CARE MOUTH RINSE
15.0000 mL | OROMUCOSAL | Status: DC
  Administered 2020-05-05 – 2020-05-06 (×10): 15 mL via OROMUCOSAL

## 2020-05-05 MED ORDER — ROSUVASTATIN CALCIUM 20 MG PO TABS
20.0000 mg | ORAL_TABLET | Freq: Every day | ORAL | Status: DC
  Filled 2020-05-05 (×2): qty 1

## 2020-05-05 MED ORDER — CHLORHEXIDINE GLUCONATE 0.12% ORAL RINSE (MEDLINE KIT)
15.0000 mL | Freq: Two times a day (BID) | OROMUCOSAL | Status: DC
  Administered 2020-05-05 – 2020-05-06 (×3): 15 mL via OROMUCOSAL

## 2020-05-05 MED ORDER — FLUOXETINE HCL 20 MG PO CAPS: 40.0000 mg | ORAL_CAPSULE | Freq: Every day | ORAL | Status: DC

## 2020-05-05 MED ORDER — FENTANYL CITRATE (PF) 100 MCG/2ML IJ SOLN
INTRAMUSCULAR | Status: AC
  Filled 2020-05-05: qty 2

## 2020-05-05 MED ORDER — AMIODARONE HCL 200 MG PO TABS: 400.0000 mg | ORAL_TABLET | Freq: Two times a day (BID) | ORAL | Status: DC

## 2020-05-05 MED ORDER — HEPARIN SODIUM (PORCINE) 5000 UNIT/ML IJ SOLN
5000.0000 [IU] | Freq: Three times a day (TID) | INTRAMUSCULAR | Status: DC
  Administered 2020-05-06: 5000 [IU] via SUBCUTANEOUS
  Filled 2020-05-05: qty 1

## 2020-05-05 MED ORDER — ALLOPURINOL 100 MG PO TABS: 200.0000 mg | ORAL_TABLET | Freq: Every day | ORAL | Status: DC

## 2020-05-05 MED ORDER — FOLIC ACID 1 MG PO TABS
1.0000 mg | ORAL_TABLET | Freq: Every day | ORAL | Status: DC
  Administered 2020-05-05: 13:00:00 1 mg
  Filled 2020-05-05 (×2): qty 1

## 2020-05-05 MED ORDER — INSULIN ASPART 100 UNIT/ML ~~LOC~~ SOLN
0.0000 [IU] | SUBCUTANEOUS | Status: DC
  Administered 2020-05-05: 20:00:00 2 [IU] via SUBCUTANEOUS
  Administered 2020-05-05: 3 [IU] via SUBCUTANEOUS
  Administered 2020-05-05 (×2): 2 [IU] via SUBCUTANEOUS
  Administered 2020-05-05: 12:00:00 3 [IU] via SUBCUTANEOUS
  Administered 2020-05-06: 2 [IU] via SUBCUTANEOUS
  Administered 2020-05-06: 3 [IU] via SUBCUTANEOUS
  Administered 2020-05-06: 2 [IU] via SUBCUTANEOUS
  Administered 2020-05-06 (×2): 3 [IU] via SUBCUTANEOUS
  Administered 2020-05-07 (×2): 2 [IU] via SUBCUTANEOUS

## 2020-05-05 MED ORDER — MIDAZOLAM HCL 2 MG/2ML IJ SOLN
1.0000 mg | INTRAMUSCULAR | Status: DC | PRN
  Administered 2020-05-05: 1 mg via INTRAVENOUS
  Filled 2020-05-05: qty 2

## 2020-05-05 MED ORDER — FLUOXETINE HCL 20 MG PO CAPS
40.0000 mg | ORAL_CAPSULE | Freq: Every day | ORAL | Status: DC
  Administered 2020-05-05: 40 mg
  Filled 2020-05-05 (×2): qty 2

## 2020-05-05 MED ORDER — ADULT MULTIVITAMIN W/MINERALS CH: 1.0000 | ORAL_TABLET | Freq: Every day | ORAL | Status: DC

## 2020-05-05 MED ORDER — THIAMINE HCL 100 MG PO TABS: 100.0000 mg | ORAL_TABLET | Freq: Every day | ORAL | Status: DC

## 2020-05-05 MED ORDER — PREDNISONE 5 MG PO TABS
5.0000 mg | ORAL_TABLET | Freq: Every day | ORAL | Status: DC
  Administered 2020-05-05 – 2020-05-06 (×2): 5 mg
  Filled 2020-05-05 (×2): qty 1

## 2020-05-05 MED ORDER — TACROLIMUS 1 MG/ML ORAL SUSPENSION
1.5000 mg | Freq: Two times a day (BID) | ORAL | Status: DC
  Administered 2020-05-05 – 2020-05-06 (×3): 1.5 mg
  Filled 2020-05-05 (×5): qty 1.5

## 2020-05-05 MED ORDER — ADULT MULTIVITAMIN W/MINERALS CH
1.0000 | ORAL_TABLET | Freq: Every day | ORAL | Status: DC
  Administered 2020-05-05: 1
  Filled 2020-05-05 (×2): qty 1

## 2020-05-05 MED ORDER — PANTOPRAZOLE SODIUM 40 MG PO PACK
40.0000 mg | PACK | Freq: Every day | ORAL | Status: DC
  Administered 2020-05-05: 12:00:00 40 mg
  Filled 2020-05-05 (×2): qty 20

## 2020-05-05 MED ORDER — MIDAZOLAM HCL 2 MG/2ML IJ SOLN
1.0000 mg | INTRAMUSCULAR | Status: DC | PRN
  Administered 2020-05-06: 1 mg via INTRAVENOUS
  Filled 2020-05-05: qty 2

## 2020-05-05 MED ORDER — MYCOPHENOLATE 200 MG/ML ORAL SUSPENSION
250.0000 mg | Freq: Two times a day (BID) | ORAL | Status: DC
  Administered 2020-05-05 – 2020-05-06 (×3): 250 mg
  Filled 2020-05-05 (×3): qty 5

## 2020-05-05 MED ORDER — FENTANYL CITRATE (PF) 100 MCG/2ML IJ SOLN
25.0000 ug | INTRAMUSCULAR | Status: DC | PRN
  Administered 2020-05-05 – 2020-05-06 (×7): 100 ug via INTRAVENOUS
  Filled 2020-05-05 (×7): qty 2

## 2020-05-05 MED ORDER — PREDNISONE 5 MG PO TABS: 5.0000 mg | ORAL_TABLET | Freq: Every day | ORAL | Status: DC

## 2020-05-05 MED ORDER — APIXABAN 5 MG PO TABS: 5.0000 mg | ORAL_TABLET | Freq: Two times a day (BID) | ORAL | Status: DC

## 2020-05-05 MED FILL — Medication: Qty: 1 | Status: AC
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
## 2020-05-05 NOTE — Progress Notes (Signed)
Subjective:  Overnight events noted. Wife present at bedside. No prior history of AF.   Intake/Output from previous day:  I/O last 3 completed shifts: In: 2418.6 [I.V.:92.1; IV Piggyback:2326.5] Out: 850 [Urine:850] Total I/O In: 138.3 [I.V.:138.3] Out: 0   Blood pressure (!) 88/71, pulse 91, temperature 98.7 F (37.1 C), temperature source Axillary, resp. rate 20, height 6' (1.829 m), weight 116.2 kg, SpO2 100 %. Physical Exam  Constitutional: He appears well-developed and well-nourished. He is intubated.  Cardiovascular: Normal rate, regular rhythm, normal heart sounds and intact distal pulses. Exam reveals no gallop.  No murmur heard. No leg edema, no JVD.  Pulmonary/Chest: Effort normal and breath sounds normal. He is intubated.  Abdominal: Soft. Bowel sounds are normal.   Lab Results: BMP BNP (last 3 results) No results for input(s): BNP in the last 8760 hours.  ProBNP (last 3 results) No results for input(s): PROBNP in the last 8760 hours. BMP Latest Ref Rng & Units 05/05/2020 05/05/2020 05/05/2020  Glucose 70 - 99 mg/dL - 242(H) 278(H)  BUN 8 - 23 mg/dL - 31(H) 31(H)  Creatinine 0.61 - 1.24 mg/dL - 2.22(H) 2.23(H)  Sodium 135 - 145 mmol/L 139 140 140  Potassium 3.5 - 5.1 mmol/L 3.7 4.2 4.2  Chloride 98 - 111 mmol/L - 105 105  CO2 22 - 32 mmol/L - 25 24  Calcium 8.9 - 10.3 mg/dL - 9.4 9.8   Hepatic Function Latest Ref Rng & Units 05/05/2020 05/05/2020 05/04/2020  Total Protein 6.5 - 8.1 g/dL 5.6(L) - -  Albumin 3.5 - 5.0 g/dL 2.9(L) 2.9(L) 2.7(L)  AST 15 - 41 U/L 64(H) - -  ALT 0 - 44 U/L 41 - -  Alk Phosphatase 38 - 126 U/L 69 - -  Total Bilirubin 0.3 - 1.2 mg/dL 1.1 - -  Bilirubin, Direct 0.0 - 0.3 mg/dL - - -   CBC Latest Ref Rng & Units 05/05/2020 05/05/2020 05/05/2020  WBC 4.0 - 10.5 K/uL - 8.9 13.2(H)  Hemoglobin 13.0 - 17.0 g/dL 9.9(L) 12.0(L) 12.1(L)  Hematocrit 39.0 - 52.0 % 29.0(L) 38.4(L) 38.3(L)  Platelets 150 - 400 K/uL - 201 235   Lipid Panel      Component Value Date/Time   CHOL 157 02/27/2020 0000   TRIG 184 (A) 02/27/2020 0000   HDL 55 02/27/2020 0000   CHOLHDL 3 06/23/2019 0910   VLDL 49.2 (H) 06/23/2019 0910   LDLCALC 71 02/27/2020 0000   LDLDIRECT 91.0 06/23/2019 0910   Cardiac Panel (last 3 results) No results for input(s): CKTOTAL, CKMB, TROPONINI, RELINDX in the last 72 hours.  HEMOGLOBIN A1C Lab Results  Component Value Date   HGBA1C 5.9 (H) 05/02/2020   MPG 122.63 05/02/2020   TSH Recent Labs    05/05/20 0437  TSH 2.579   Imaging: CT HEAD WO CONTRAST  Result Date: 05/05/2020 CLINICAL DATA:  Altered mental status. Admitted for UTI. Cardiac breath this morning. EXAM: CT HEAD WITHOUT CONTRAST TECHNIQUE: Contiguous axial images were obtained from the base of the skull through the vertex without intravenous contrast. COMPARISON:  MR brain dated March 09, 2018. FINDINGS: Brain: No evidence of acute infarction, hemorrhage, hydrocephalus, extra-axial collection or mass lesion/mass effect. Mild-to-moderate generalized cerebral atrophy, slightly advanced for age. Scattered mild periventricular and subcortical white matter hypodensities are nonspecific, but favored to reflect chronic microvascular ischemic changes. Vascular: Calcified atherosclerosis at the skullbase. No hyperdense vessel. Skull: Normal. Negative for fracture or focal lesion. Sinuses/Orbits: Air-fluid levels with right posterior ethmoid air cell and both  sphenoid sinuses. The orbits are unremarkable. Other: None. IMPRESSION: 1.  No acute intracranial abnormality. Electronically Signed   By: Titus Dubin M.D.   On: 05/05/2020 06:53   DG CHEST PORT 1 VIEW  Result Date: 05/05/2020 CLINICAL DATA:  Intubation EXAM: PORTABLE CHEST 1 VIEW COMPARISON:  Two days ago FINDINGS: Endotracheal tube tip is between the clavicular heads and carina. Low volume chest with dense and streaky opacities at the bases. Left diaphragm is now obscured. There may be a left pleural  effusion. No pulmonary edema or pneumothorax. Artifact from EKG leads IMPRESSION: 1. Interval bilateral pneumonia or atelectasis. 2. New endotracheal tube in good position. Electronically Signed   By: Monte Fantasia M.D.   On: 05/05/2020 06:11    Cardiac Studies:  Echocardiogram 05/02/2020: 1. Left ventricular ejection fraction, by estimation, is 60 to 65%. The left ventricle has normal function. The left ventricle has no regional  wall motion abnormalities. There is mild left ventricular hypertrophy. Left ventricular diastolic parameters were normal.  2. Right ventricular systolic function is normal. The right ventricular size is normal.  3. Left atrial size was mildly dilated.  4. The mitral valve is normal in structure. Trivial mitral valve regurgitation. No evidence of mitral stenosis.  5. The aortic valve was not well visualized. Aortic valve regurgitation is mild to moderate. Mild aortic valve stenosis.  6. The inferior vena cava is normal in size with greater than 50% respiratory variability, suggesting right atrial pressure of 3 mmHg.  EKG:  EKG 05/04/2020: Normal sinus rhythm at rate of 79 bpm, normal axis.  Poor R wave progression, cannot exclude anteroseptal infarct old.  Normal QT interval.  No evidence of ischemia.  No significant change from 05/02/2020.   Scheduled Meds: . allopurinol  200 mg Per Tube Daily  . budesonide (PULMICORT) nebulizer solution  0.25 mg Nebulization BID  . chlorhexidine gluconate (MEDLINE KIT)  15 mL Mouth Rinse BID  . Chlorhexidine Gluconate Cloth  6 each Topical Daily  . FLUoxetine  40 mg Per Tube Daily  . folic acid  1 mg Per Tube Daily  . [START ON 05/06/2020] heparin  5,000 Units Subcutaneous Q8H  . insulin aspart  0-15 Units Subcutaneous Q4H  . ipratropium-albuterol  3 mL Nebulization Q4H  . mouth rinse  15 mL Mouth Rinse 10 times per day  . metoprolol tartrate  50 mg Per Tube BID  . multivitamin with minerals  1 tablet Per Tube Daily   . mycophenolate  250 mg Per Tube BID  . pantoprazole sodium  40 mg Per Tube Daily  . predniSONE  5 mg Per Tube Q breakfast  . [START ON 05/06/2020] rosuvastatin  20 mg Per Tube Daily  . sodium bicarbonate  650 mg Per Tube BID  . sodium chloride flush  3 mL Intravenous Once  . tacrolimus  1.5 mg Per Tube BID  . thiamine  100 mg Per Tube Daily   Or  . thiamine  100 mg Intravenous Daily   Continuous Infusions: . cefTRIAXone (ROCEPHIN)  IV Stopped (05/05/20 1627)  . dexmedetomidine (PRECEDEX) IV infusion 0.4 mcg/kg/hr (05/05/20 2300)  . diltiazem (CARDIZEM) infusion 15 mg/hr (05/05/20 2300)   PRN Meds:.acetaminophen **OR** acetaminophen, fentaNYL (SUBLIMAZE) injection, fentaNYL (SUBLIMAZE) injection, ipratropium-albuterol, labetalol, midazolam, midazolam  Assessment/Plan:  1.  A. fib with RVR new onset and only lasted few hours yesterday without recurrence, spontaneously converted to sinus.  CHA2DS2-VASc Score is 4.  Yearly risk of stroke: 4% (A, HTN, DM Vasc Dz -  Ao athero by CT).   2. Bleeding at th endotracheal site  3. Altered mental status: Probably a combination of alcohol withdrawal, sundowning and acute illness contributing and patient's wife does not think alcohol is playing a role although states he drinks about a bottle of wine a day. No alcohol for the past 7 days.  4.  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  As it was first episode of A. fib and he is converted back to sinus rhythm, he only received 1 dose of amiodarone, will discontinue this and also discontinue anticoagulation especially in view of traumatic bleeding after his intubation, will continue to watch him closely for recurrence of A. fib and have  a low threshold to restart anticoagulation.  We will start him back on DVT dose heparin.  Blood pressure is soft, presently not on any beta-blockers, I will continue to follow alongside.   Adrian Prows, M.D. 05/05/2020, 11:21 PM [facility] Pager: [phone] Office: [phone] If no answer: [phone]

## 2020-05-05 NOTE — Procedures (Signed)
Cortrak  Person Inserting Tube:  James Zimmerman, RD Tube Type:  Cortrak - 43 inches Tube Location:  Right nare Initial Placement:  Stomach Secured by: Bridle Technique Used to Measure Tube Placement:  Documented cm marking at nare/ corner of mouth Cortrak Secured At:  70 cm Procedure Comments:  Cortrak Tube Team Note:  Consult received to place a Cortrak feeding tube.   No x-ray is required. RN may begin using tube.   If the tube becomes dislodged please keep the tube and contact the Cortrak team at [email or web address] (password [email or web address]) for replacement.  If after hours and replacement cannot be delayed, place a NG tube and confirm placement with an abdominal x-ray.       James Matin, MS, RD, LDN, CNSC Inpatient Clinical Dietitian RD pager # available in [place]  After hours/weekend pager # available in [facility]

## 2020-05-05 NOTE — Transfer of Care (Signed)
OOD intubation 

## 2020-05-05 NOTE — Progress Notes (Signed)
Called to patient's bedside for intubation due to unresponsiveness.  Patient manually ventilated and suctioned for frank red blood.     Code "Blue" called.  Patient intubated by CRNA Lynnae Sandhoff.  #8 ETT inserted with glide scope. Xray ordered.  Transported patient to 4N16.  Transport uneventful.  Pt. placed on ventilator.   Report given to ICU RRT.  Julian Reil, RRT

## 2020-05-05 NOTE — Consult Note (Addendum)
NAME:  James Zimmerman, MRN:  [id number], DOB:  [birth year], LOS: 3 ADMISSION DATE:  05/02/2020, CONSULTATION DATE: 05/05/2020 REFERRING MD: Dr. Maren Beach, CHIEF COMPLAINT: Respiratory arrest  Brief History   69 year old male with history of renal transplant alcohol abuse admitted 5/9 with suspected urosepsis.  Early morning 5/12 he became violently agitated, followed by obtundation and respiratory arrest.  He was intubated by anesthesia and PCCM was consulted.  History of present illness   69 year old male with past medical history as below, which is significant for polycystic kidney disease status post kidney transplant 2004, diabetes, alcohol abuse, and chronic kidney disease. He was recently admitted in [facility]) where he was treated for UTI, but left after two days against medical advice. He then presented a few days later to [facility] ED with complaints of altered sensorium and fever. He was admitted for sepsis with presumed urinary source and treated with empiric antibiotics. CT abdomen described no acute issue. Renal doppler WNL. Course complicated by AFRVR. He had been improving with antibiotics and IVF hydration until the early AM hours of 5/12 when he became confused, agitated, and even violent with staff. He attempted to run out of the hospital. There was concern for ETOH withdrawal and he was treated with ativan and haldol. He unfortunately became less responsive and suffered periods of apnea progressing to respiratory arrest. He was emergently intubated and PCCM was consulted.   Past Medical History   has a past medical history of Anxiety and depression, Chronic renal insufficiency, stage III (moderate), DDD (degenerative disc disease), lumbar, Deep vein thrombosis (DVT) of left upper extremity ([place]), Diabetes mellitus with complication ([place]), Diverticulosis, Gastritis (01/2017), GERD (gastroesophageal reflux disease), Glaucoma, Gout, H/O nonmelanoma skin cancer,  H/O polycystic kidney disease, History of depression, History of kidney transplant, History of retinal detachment, adenomatous colonic polyps (02/2012; 05/2014; 08/2017), Hyperlipemia, mixed, Hypertension, and Osteoarthritis.   Significant Hospital Events   5/9 admit 5/12 intubated, tx to ICU  Consults:  Cardiology Nephrology PCCM  Procedures:  ETT 5/12 >  Significant Diagnostic Tests:  CT abdomen 5/9 > Normal appearance of renal transplant on this unenhanced exam. No evidence of hydronephrosis. 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 Mild diffuse wall thickening of urinary bladder, which may be due to cystitis. Two small low-attenuation liver lesions which cannot be characterized on this unenhanced exam Echo 5/9 > LVEF 60-65%. Grossly normal exam.  US renal 5/9 > Unremarkable    Micro Data:  Blood Cx 5/9 >>> Urine 5/9> neg  Antimicrobials:  Ceftriaxone 5/12 >  Interim history/subjective:    Objective   Blood pressure 106/62, pulse 73, temperature 98.2 F (36.8 C), temperature source Oral, resp. rate 16, height 6' (1.829 m), weight 117.5 kg, SpO2 100 %.        Intake/Output Summary (Last 24 hours) at 05/05/2020 0503 Last data filed at 05/04/2020 2028 Gross per 24 hour  Intake 12.83 ml  Output 850 ml  Net -837.17 ml   Filed Weights   05/02/20 1154 05/02/20 2135  Weight: 113.4 kg 117.5 kg    Examination: General: Elderly appearing male on vent HENT: [place]/AT, PERRL, no JVD Lungs: Coarse Cardiovascular: RRR, no MRG Abdomen: Soft, non-distended, hypoactive Extremities: No acute deformity. No edema.  Neuro: Sedated, able to follow commands.   Resolved Hospital Problem list     Assessment & Plan:   Acute hypoxemic respiratory failure: Secondary to upper airway obstruction after sedation vs aspiration event. Bleeding felt to be  secondary to nasal trumpet.  Probable undiagnosed OSA L sided pleural effusion - Full vent support -  Follow CXR - ABG - VAP bundle  Acute toxic encephalopathy Alcohol withdrawal: wife reports no drinks since 5/4, however. - Precedex infusion - PRN benzo - Neurochecks - CT head  Urosepsis - Continue ceftriaxone - Give volume for borderline low BP - May need phenylephrine infusion   AKI CKD Polycystic kidney disease s/p transplant - nephrology following - Continuing prednisone, cellcept, prograf  Atrial fibrillation with RVR, new onset - Cardiology following - Amiodarone started - Hold metoprolol and diltiazem due to hypotension - Eliquis continue    Liver lesions: incidental finding on CT abdomen - Outpatient follow up with MRI  DM - CBG monitoring and SSI   Best practice:  Diet: NPO Pain/Anxiety/Delirium protocol (if indicated): PAD protocol precedex VAP protocol (if indicated): per protocol DVT prophylaxis: Eliquis GI prophylaxis: NA Glucose control: SSI Mobility: BR Code Status: FULL Family Communication: Wife called Disposition: ICU  Labs   CBC: Recent Labs  Lab 05/02/20 1207 05/02/20 1207 05/02/20 1251 05/02/20 1844 05/03/20 0219 05/04/20 0524 05/05/20 0437  WBC 8.7  --   --   --  6.8 6.8 13.2*  HGB 11.5*   < > 11.2* 10.5* 11.3* 10.8* 12.1*  HCT 36.3*   < > 33.0* 31.0* 34.8* 33.6* 38.3*  MCV 99.7  --   --   --  99.1 98.8 100.0  PLT 107*  --   --   --  106* 126* 235   < > = values in this interval not displayed.    Basic Metabolic Panel: Recent Labs  Lab 05/02/20 1207 05/02/20 1251 05/02/20 1844 05/03/20 0219 05/04/20 0210  NA 128* 128* 126* 129* 135  K 4.6 4.5 5.8* 5.3* 5.8*  CL 98  --   --  99 105  CO2 18*  --   --  16* 15*  GLUCOSE 62*  --   --  189* 207*  BUN 49*  --   --  53* 45*  CREATININE 3.33*  --   --  3.08* 2.34*  CALCIUM 8.6*  --   --  8.3* 8.4*  PHOS  --   --   --   --  3.6   GFR: Estimated Creatinine Clearance: 39.4 mL/min (A) (by C-G formula based on SCr of 2.34 mg/dL (H)). Recent Labs  Lab 05/02/20 1207  05/03/20 0219 05/04/20 0524 05/05/20 0437  WBC 8.7 6.8 6.8 13.2*    Liver Function Tests: Recent Labs  Lab 05/02/20 1207 05/03/20 0219 05/04/20 0210  AST 89* 80*  --   ALT 40 42  --   ALKPHOS 69 74  --   BILITOT 1.5* 1.5*  --   PROT 5.7* 5.7*  --   ALBUMIN 2.9* 2.8* 2.7*   Recent Labs  Lab 05/02/20 1207  LIPASE 171*   Recent Labs  Lab 05/02/20 1244  AMMONIA 9    ABG    Component Value Date/Time   PHART 7.216 (L) 05/03/2020 1152   PCO2ART 45.3 05/03/2020 1152   PO2ART 59.2 (L) 05/03/2020 1152   HCO3 17.7 (L) 05/03/2020 1152   TCO2 20 (L) 05/02/2020 1844   ACIDBASEDEF 8.7 (H) 05/03/2020 1152   O2SAT 87.2 05/03/2020 1152     Coagulation Profile: Recent Labs  Lab 05/02/20 1207  INR 1.1    Cardiac Enzymes: No results for input(s): CKTOTAL, CKMB, CKMBINDEX, TROPONINI in the last 168 hours.  HbA1C: Hemoglobin A1C  Date/Time  Value Ref Range Status  02/27/2020 12:00 AM 5.7  Final  02/18/2018 12:00 AM 5.8  Final   HbA1c, POC (controlled diabetic range)  Date/Time Value Ref Range Status  08/30/2018 10:46 AM 6.0 0.0 - 7.0 % Final   Hgb A1c MFr Bld  Date/Time Value Ref Range Status  05/02/2020 05:04 PM 5.9 (H) 4.8 - 5.6 % Final    Comment:    (NOTE) Pre diabetes:          5.7%-6.4% Diabetes:              >6.4% Glycemic control for   <7.0% adults with diabetes   06/23/2019 09:10 AM 6.1 4.6 - 6.5 % Final    Comment:    Glycemic Control Guidelines for People with Diabetes:Non Diabetic:  <6%Goal of Therapy: <7%Additional Action Suggested:  >8%     CBG: Recent Labs  Lab 05/03/20 2019 05/04/20 0757 05/04/20 1221 05/04/20 1631 05/04/20 2021  GLUCAP 160* 209* 241* 219* 175*    Review of Systems:   Patient is encephalopathic and/or intubated. Therefore history has been obtained from chart review.   Past Medical History  He,  has a past medical history of Anxiety and depression, Chronic renal insufficiency, stage III (moderate), DDD (degenerative  disc disease), lumbar, Deep vein thrombosis (DVT) of left upper extremity ([place]), Diabetes mellitus with complication ([place]), Diverticulosis, Gastritis (01/2017), GERD (gastroesophageal reflux disease), Glaucoma, Gout, H/O nonmelanoma skin cancer, H/O polycystic kidney disease, History of depression, History of kidney transplant, History of retinal detachment, adenomatous colonic polyps (02/2012; 05/2014; 08/2017), Hyperlipemia, mixed, Hypertension, and Osteoarthritis.   Surgical History    Past Surgical History:  Procedure Laterality Date  . COLONOSCOPY  06/10/14; 09/05/17   2015: Tubular adenoma x 4: recall 3 yrs (Dr. Alferd [facility]). 08/2017 NO POLYPS. Recall 5 yrs.  . COLONOSCOPY  03/19/2012   tubular adenoma x 1; diverticulosis, int hem.Procedure: COLONOSCOPY;  Surgeon: Winfield Cunas., MD;  Location: [facility] ENDOSCOPY;  Service: Endoscopy;  Laterality: N/A;  . HOT HEMOSTASIS  03/19/2012   Procedure: HOT HEMOSTASIS (ARGON PLASMA COAGULATION/BICAP);  Surgeon: Winfield Cunas., MD;  Location[facility] ENDOSCOPY;  Service: Endoscopy;  Laterality: N/A;  . KIDNEY TRANSPLANT  2004  . NOSE SURGERY  09/03/2019   skin cancer  . TOTAL HIP ARTHROPLASTY  12/10/2012   Procedure: TOTAL HIP ARTHROPLASTY ANTERIOR APPROACH;  Surgeon: Mauri Pole, MD;  Location: [facility] ORS;  Service: Orthopedics;  Laterality: Right;     Social History   reports that he quit smoking about 36 years ago. His smoking use included cigarettes. He has a 60.00 pack-year smoking history. He has never used smokeless tobacco. He reports current alcohol use of about 28.0 standard drinks of alcohol per week. He reports that he does not use drugs.   Family History   His family history includes Diabetes in his maternal grandmother and mother; Heart attack in his mother; Heart disease in his brother; Hyperlipidemia in his father; Hypertension in his father; Kidney disease in his brother, father, and paternal grandfather; Polycystic kidney disease in  an other family member. There is no history of Anesthesia problems, Hypotension, Malignant hyperthermia, or Pseudochol deficiency.   Allergies Allergies  Allergen Reactions  . Lipitor [Atorvastatin] Other (See Comments)    Headaches  . Zocor [Simvastatin] Other (See Comments)    Myalgia  . Penicillins Rash     Home Medications  Prior to Admission medications   Medication Sig Start Date End Date Taking? Authorizing Provider  ACCU-CHEK SMARTVIEW test strip USE TO TEST BLOOD SUGAR EVERY DAY Patient taking differently: 1 each by Other route daily.  05/30/19  Yes McGowen, Adrian Blackwater, MD  allopurinol (ZYLOPRIM) 100 MG tablet Take 200 mg by mouth daily.    Yes [provider]  ALPRAZolam (XANAX) 0.25 MG tablet Take 1 tablet (0.25 mg total) by mouth daily as needed. Patient taking differently: Take 0.25 mg by mouth daily as needed for anxiety or sleep.  03/05/20  Yes McGowen, Adrian Blackwater, MD  amLODipine (NORVASC) 5 MG tablet Take 5 mg by mouth daily.   Yes [provider]  FLUoxetine (PROZAC) 40 MG capsule Take 40 mg by mouth daily. Patient takes in am   Yes [provider]  glipiZIDE (GLUCOTROL) 5 MG tablet Take 5 mg by mouth See admin instructions. Take 10mg  in the morning, and 5mg  in the evening.   Yes [provider]  MAGNESIUM-OXIDE 400 (241.3 Mg) MG tablet Take 400 mg by mouth daily.  08/30/18  Yes [provider]  metoprolol tartrate (LOPRESSOR) 50 MG tablet Take 1 tablet (50 mg total) by mouth 2 (two) times daily. 04/24/18  Yes McGowen, Adrian Blackwater, MD  mycophenolate (CELLCEPT) 250 MG capsule Take 250 mg by mouth 2 (two) times daily.    Yes [provider]  pantoprazole (PROTONIX) 20 MG tablet Take 20 mg by mouth every morning.  01/29/20  Yes [provider]  pioglitazone (ACTOS) 45 MG tablet Take 1 tablet (45 mg total) by mouth daily. 03/08/20  Yes McGowen, Adrian Blackwater, MD  predniSONE (DELTASONE) 5 MG tablet Take 5 mg by mouth daily.   Yes  [provider]  rosuvastatin (CRESTOR) 20 MG tablet TAKE 1 TABLET BY MOUTH EVERY DAY Patient taking differently: Take 20 mg by mouth daily.  07/23/19  Yes McGowen, Adrian Blackwater, MD  tacrolimus (PROGRAF) 0.5 MG capsule Take 1 capsule by mouth 2 (two) times daily. 01/19/16  Yes [provider]  tacrolimus (PROGRAF) 1 MG capsule Take 1 mg by mouth 2 (two) times daily.    Yes [provider]  ZIOPTAN 0.0015 % SOLN Place 1 drop into both eyes at bedtime. 09/25/17  Yes [provider]     Critical care time: 45 mins     Georgann Housekeeper, AGACNP-BC [place] for personal pager PCCM on call pager [phone]  05/05/2020 5:56 AM  Patient seen, examined, chart reviewed.  Critical care plan discussedwith Mr Heber [place].  Agree with above assessment and plan.

## 2020-05-05 NOTE — Progress Notes (Signed)
Patient hypotensive with MAPs in the 60s. CCM NP paged. Will continue to monitor. Order given for 1L LR Bolus. Thayer Ohm D

## 2020-05-05 NOTE — Progress Notes (Signed)
PT Cancellation Note  Patient Details Name: James Zimmerman MRN: [id number] DOB: 1951/03/03   Cancelled Treatment:    Reason Eval/Treat Not Completed: Patient not medically ready;Medical issues which prohibited therapy. Pt in respiratory failure this morning requiring intubation. PT will hold therapy until pt is more medically stable and able to participate in skilled PT intervention.   Zenaida Niece 05/05/2020, 7:38 AM

## 2020-05-05 NOTE — Progress Notes (Signed)
RN responded to patient room for bed alarm. Patient found standing beside bed. RN asked patient to please return to bed for his safety. Patient agreeable. While RN was assisting patient to bed patient became highly agitated and grabbed RN by the wrist pushing her against the wall. Patient then proceeded to accuse the staff of taking his things. Patient left room and proceeded down to stairwell in an attempt to leave. Patient oriented only to self. Security, PD, MD, and RRT notified. Security able to talk patient into returning to his room. Patient agitated refusing care. Delusional behaviors were noted as patient claimed salesmen where in his room trying to kill him, patient searching his room looking for a knife to protect himself. See new orders placed by MD. Patient resting in bed, bed alarm on, will continue to monitor.

## 2020-05-05 NOTE — Progress Notes (Signed)
Patient unresponsive and apneic. SpO2 57% HR 73 bouncing, RR 32 and labored.Called for help started rescue breathing with ambu bag. RT, RRT, and Code Blue Team responding to event for management of patient.  MD notified and responded face to face. Patient transferred to Critical care for a higher level of care.

## 2020-05-05 NOTE — Significant Event (Addendum)
Rapid Response Event Note  Overview: Respiratory arrest  Initial Focused Assessment: When rounding to reassess patient, arrived to find staff supporting pt with rescue breathing via BVM ventilations. Pt had a pulse. He was cyanotic with sats in the 60s and had sonorous respirations with periods of apnea prior to emergency measures being implemented. Nasal airway 7.0 inserted to assist with assisted ventilations. Pt was spontaneously breathing after airway was opened with jaw thrust and BVM was used to support ventilations.  After trumpet was inserted, pt had nasal bleeding. Dr. Emmit Alexanders notified of situation through camera in room and the need for PCCM assistance. Code Blue was initiated due to respiratory arrest and need for an emergent airway. Dr. Myna Hidalgo came to bedside to assist with emergency. Pt transferred to 4N16 after intubation and pt stabilized.   Interventions: -BVM for assisted ventilation -Code Blue for respiratory arrest -Pt intubated by [employer] during code blue   Event Summary: Call initiated 0405 Arrived at call 0405 Call ended [phone]

## 2020-05-05 NOTE — Progress Notes (Signed)
[facility] Daily Progress Note  S: Patient agitated overnight, then became obtunded before he experienced respiratory arrest, CODE BLUE was called, patient intubated, CCM has taken over as primary.  Did not lose pulse-  Felt to be mainly resp arrest from sedating medications.  Wife at bedside this AM-  Very upset-  Recounting things from hosp in [facility].  Refuses to believe that patient was as combative as he apparently was   O:BP (!) 87/60 (BP Location: Left Arm)   Pulse 69   Temp (!) 97.5 F (36.4 C) (Axillary)   Resp 20   Ht 6' (1.829 m)   Wt 116.2 kg   SpO2 96%   BMI 34.74 kg/m   Intake/Output Summary (Last 24 hours) at 05/05/2020 0904 Last data filed at 05/05/2020 0800 Gross per 24 hour  Intake 1227.56 ml  Output 850 ml  Net 377.56 ml   Intake/Output: I/O last 3 completed shifts: In: 1211.7 [I.V.:16.8; IV Piggyback:1194.9] Out: 4496 [Urine:2350]  Intake/Output this shift:  Total I/O In: 15.9 [I.V.:5.9; IV Piggyback:10] Out: -  Weight change:  Gen: sedated right now on vent CVS:RRR Resp:on vent-  Dec BS PRF:[id number] Ext: really no edema  Recent Labs  Lab 05/02/20 1207 05/02/20 1207 05/02/20 1251 05/02/20 1844 05/03/20 0219 05/04/20 0210 05/05/20 0436 05/05/20 0513 05/05/20 0553  NA 128*   < > 128* 126* 129* 135 140 140 139  K 4.6   < > 4.5 5.8* 5.3* 5.8* 4.2 4.2 3.7  CL 98  --   --   --  99 105 105 105  --   CO2 18*  --   --   --  16* 15* 24 25  --   GLUCOSE 62*  --   --   --  189* 207* 278* 242*  --   BUN 49*  --   --   --  53* 45* 31* 31*  --   CREATININE 3.33*  --   --   --  3.08* 2.34* 2.23* 2.22*  --   ALBUMIN 2.9*  --   --   --  2.8* 2.7* 2.9* 2.9*  --   CALCIUM 8.6*  --   --   --  8.3* 8.4* 9.8 9.4  --   PHOS  --   --   --   --   --  3.6 3.6  --   --   AST 89*  --   --   --  80*  --   --  64*  --   ALT 40  --   --   --  42  --   --  41  --    < > = values in this interval not displayed.   Liver Function Tests: Recent Labs  Lab  05/02/20 1207 05/02/20 1207 05/03/20 0219 05/03/20 0219 05/04/20 0210 05/05/20 0436 05/05/20 0513  AST 89*  --  80*  --   --   --  64*  ALT 40  --  42  --   --   --  41  ALKPHOS 69  --  74  --   --   --  69  BILITOT 1.5*  --  1.5*  --   --   --  1.1  PROT 5.7*  --  5.7*  --   --   --  5.6*  ALBUMIN 2.9*   < > 2.8*   < > 2.7* 2.9* 2.9*   < > = values  in this interval not displayed.   Recent Labs  Lab 05/02/20 1207  LIPASE 171*   Recent Labs  Lab 05/02/20 1244  AMMONIA 9   CBC: Recent Labs  Lab 05/02/20 1207 05/02/20 1251 05/03/20 0219 05/03/20 0219 05/04/20 0524 05/04/20 0524 05/05/20 0437 05/05/20 0513 05/05/20 0553  WBC 8.7   < > 6.8   < > 6.8  --  13.2* 8.9  --   NEUTROABS  --   --   --   --   --   --   --  6.9  --   HGB 11.5*   < > 11.3*   < > 10.8*   < > 12.1* 12.0* 9.9*  HCT 36.3*   < > 34.8*   < > 33.6*   < > 38.3* 38.4* 29.0*  MCV 99.7  --  99.1  --  98.8  --  100.0 101.9*  --   PLT 107*   < > 106*   < > 126*  --  235 201  --    < > = values in this interval not displayed.   Cardiac Enzymes: No results for input(s): CKTOTAL, CKMB, CKMBINDEX, TROPONINI in the last 168 hours. CBG: Recent Labs  Lab 05/04/20 0757 05/04/20 1221 05/04/20 1631 05/04/20 2021 05/05/20 0819  GLUCAP 209* 241* 219* 175* 144*    Iron Studies: No results for input(s): IRON, TIBC, TRANSFERRIN, FERRITIN in the last 72 hours. Studies/Results: CT HEAD WO CONTRAST  Result Date: 05/05/2020 CLINICAL DATA:  Altered mental status. Admitted for UTI. Cardiac breath this morning. EXAM: CT HEAD WITHOUT CONTRAST TECHNIQUE: Contiguous axial images were obtained from the base of the skull through the vertex without intravenous contrast. COMPARISON:  MR brain dated March 09, 2018. FINDINGS: Brain: No evidence of acute infarction, hemorrhage, hydrocephalus, extra-axial collection or mass lesion/mass effect. Mild-to-moderate generalized cerebral atrophy, slightly advanced for age. Scattered mild  periventricular and subcortical white matter hypodensities are nonspecific, but favored to reflect chronic microvascular ischemic changes. Vascular: Calcified atherosclerosis at the skullbase. No hyperdense vessel. Skull: Normal. Negative for fracture or focal lesion. Sinuses/Orbits: Air-fluid levels with right posterior ethmoid air cell and both sphenoid sinuses. The orbits are unremarkable. Other: None. IMPRESSION: 1.  No acute intracranial abnormality. Electronically Signed   By: Titus Dubin M.D.   On: 05/05/2020 06:53   DG CHEST PORT 1 VIEW  Result Date: 05/05/2020 CLINICAL DATA:  Intubation EXAM: PORTABLE CHEST 1 VIEW COMPARISON:  Two days ago FINDINGS: Endotracheal tube tip is between the clavicular heads and carina. Low volume chest with dense and streaky opacities at the bases. Left diaphragm is now obscured. There may be a left pleural effusion. No pulmonary edema or pneumothorax. Artifact from EKG leads IMPRESSION: 1. Interval bilateral pneumonia or atelectasis. 2. New endotracheal tube in good position. Electronically Signed   By: Monte Fantasia M.D.   On: 05/05/2020 06:11   DG Chest Port 1 View  Result Date: 05/03/2020 CLINICAL DATA:  69 year old male with increase shortness of breath, decreased oxygen saturation, lethargy. EXAM: PORTABLE CHEST 1 VIEW COMPARISON:  Chest radiographs 07/27/2012 and earlier. FINDINGS: Portable AP semi upright view at 1213 hours. Lung volumes and mediastinal contours remain within normal limits. Visualized tracheal air column is within normal limits. No pneumothorax. Allowing for portable technique the lungs are clear. No acute osseous abnormality identified. IMPRESSION: Negative portable chest. Electronically Signed   By: Genevie Ann M.D.   On: 05/03/2020 12:22   . allopurinol  200 mg Oral  Daily  . amiodarone  400 mg Oral BID  . apixaban  5 mg Oral BID  . budesonide (PULMICORT) nebulizer solution  0.25 mg Nebulization BID  . chlorhexidine gluconate (MEDLINE  KIT)  15 mL Mouth Rinse BID  . Chlorhexidine Gluconate Cloth  6 each Topical Daily  . FLUoxetine  40 mg Oral Daily  . folic acid  1 mg Oral Daily  . insulin aspart  0-15 Units Subcutaneous Q4H  . ipratropium-albuterol  3 mL Nebulization Q4H  . mouth rinse  15 mL Mouth Rinse 10 times per day  . metoprolol tartrate  50 mg Oral BID  . multivitamin with minerals  1 tablet Oral Daily  . mycophenolate  250 mg Oral BID  . pantoprazole  20 mg Oral q morning - 10a  . predniSONE  5 mg Oral Q breakfast  . rosuvastatin  20 mg Oral Daily  . sodium bicarbonate  650 mg Oral BID  . sodium chloride flush  3 mL Intravenous Once  . tacrolimus  1.5 mg Oral BID  . thiamine  100 mg Oral Daily   Or  . thiamine  100 mg Intravenous Daily    BMET    Component Value Date/Time   NA 139 05/05/2020 0553   NA 138 02/27/2020 0000   K 3.7 05/05/2020 0553   CL 105 05/05/2020 0513   CO2 25 05/05/2020 0513   GLUCOSE 242 (H) 05/05/2020 0513   BUN 31 (H) 05/05/2020 0513   BUN 39 (A) 02/27/2020 0000   CREATININE 2.22 (H) 05/05/2020 0513   CREATININE 1.85 02/18/2018 0000   CALCIUM 9.4 05/05/2020 0513   GFRNONAA 29 (L) 05/05/2020 0513   GFRAA 34 (L) 05/05/2020 0513   CBC    Component Value Date/Time   WBC 8.9 05/05/2020 0513   RBC 3.77 (L) 05/05/2020 0513   HGB 9.9 (L) 05/05/2020 0553   HCT 29.0 (L) 05/05/2020 0553   PLT 201 05/05/2020 0513   MCV 101.9 (H) 05/05/2020 0513   MCH 31.8 05/05/2020 0513   MCHC 31.3 05/05/2020 0513   RDW 14.4 05/05/2020 0513   LYMPHSABS 1.2 05/05/2020 0513   MONOABS 0.7 05/05/2020 0513   EOSABS 0.0 05/05/2020 0513   BASOSABS 0.0 05/05/2020 0513     Assessment: Patient is 70 year old male with history of polycystic kidney disease s/p renal transplant (2004, baseline 1.6-1.9) admitted 05/02/2020 with acute on chronic renal failure, ?UTI, ?Urosepsis, ?disruption of immunosuppressive meds.  Plan: 1. ?UTI:  UA on admission  also with hematuria.  Urine culture negative for  growth at 3 days, CT suggestive of cystitis. Being treated with rocephin  at present 2. A on CRF:  In the setting of above plus concern over volume depletion/contrast-  Less likely rejection-  So far improved with fluids/rocephin. Cr 3.33>>2.22 today, baseline 1.8.  No indications for dialysis-  oral bicarb - will need to go home on it.  Will make sure can give the prograf/cellcept and pred via tube-  dont feel like he was on high dose steroids long enough to have cortisol def but if low BP cont may need again  3. S/p transplant:  Placed on IV higher dose steroids -  Cont maintenance prograf/cellcept.  stopped the high dose steroids due to his high K -  Resume pred 5 mg today  - could his episode been from steroids causing psychosis ?  4. HTN/volume:  Not overloaded-  now BP is low maybe some due to sedation.   Is on metoprolol  5.  Hyponatremia: Improved, continue to monitor 6. Hyperkalemia, resolved:   Improved  7.  Altered mentation  Alcohol withdrawal:   High risk for withdrawal, On xanax and prozac, receiving Ativan, continue thiamine and folate-  Meds may have precipitated these events    Louis Meckel

## 2020-05-05 NOTE — Significant Event (Signed)
Responded to code blue on James Zimmerman who had to be sedated earlier (see earlier note). He was unresponsive, breathing spontaneously but ineffectively and was being bagged by RT and rapid response RN.   He never lost pulse and maintained adequate BP. Calcium and bicarbonate were administered in light of potassium 5.8 on the most recent bloodwork from 24 hrs earlier but this was a respiratory event and likely precipitated by sedation and OSA. Anesthesia intubated the patient emergently and PCCM team also came to the bedside and assumed care.

## 2020-05-05 NOTE — Progress Notes (Signed)
   05/05/20 0419  Clinical Encounter Type  Visited With Health care provider  Visit Type Code  Referral From Nurse  Consult/Referral To Chaplain   Chaplain responded to code blue. No family present at this time. Chaplain remains available for support as needs arise.   Chaplain Resident, Evelene Croon, M Div [phone] on-call pager

## 2020-05-05 NOTE — Progress Notes (Signed)
eLink Physician-Brief Progress Note Patient Name: James Zimmerman DOB: 1951-08-31 MRN: [id number]   Date of Service  05/05/2020  HPI/Events of Note  AFIB with RVR - Ventricular rate = 140. BP = 146/116.   eICU Interventions  Plan: 1. Restart Cardizem IV infusion. Titrate to HR = 65-105.      Intervention Category Major Interventions: Arrhythmia - evaluation and management  Iness Pangilinan [place] 05/05/2020, 8:02 PM

## 2020-05-06 ENCOUNTER — Inpatient Hospital Stay (HOSPITAL_COMMUNITY): Payer: Medicare Other

## 2020-05-06 DIAGNOSIS — J9602 Acute respiratory failure with hypercapnia: Secondary | ICD-10-CM

## 2020-05-06 DIAGNOSIS — J9601 Acute respiratory failure with hypoxia: Secondary | ICD-10-CM

## 2020-05-06 LAB — FERRITIN: Ferritin: 246 ng/mL (ref 24–336)

## 2020-05-06 LAB — IRON AND TIBC
Iron: 20 ug/dL — ABNORMAL LOW (ref 45–182)
Saturation Ratios: 10 % — ABNORMAL LOW (ref 17.9–39.5)
TIBC: 207 ug/dL — ABNORMAL LOW (ref 250–450)
UIBC: 187 ug/dL

## 2020-05-06 LAB — RENAL FUNCTION PANEL
Albumin: 2.5 g/dL — ABNORMAL LOW (ref 3.5–5.0)
Anion gap: 13 (ref 5–15)
BUN: 30 mg/dL — ABNORMAL HIGH (ref 8–23)
CO2: 25 mmol/L (ref 22–32)
Calcium: 8.6 mg/dL — ABNORMAL LOW (ref 8.9–10.3)
Chloride: 99 mmol/L (ref 98–111)
Creatinine, Ser: 2.05 mg/dL — ABNORMAL HIGH (ref 0.61–1.24)
GFR calc Af Amer: 37 mL/min — ABNORMAL LOW (ref >60)
GFR calc non Af Amer: 32 mL/min — ABNORMAL LOW (ref >60)
Glucose, Bld: 212 mg/dL — ABNORMAL HIGH (ref 70–99)
Phosphorus: 2.1 mg/dL — ABNORMAL LOW (ref 2.5–4.6)
Potassium: 3.6 mmol/L (ref 3.5–5.1)
Sodium: 137 mmol/L (ref 135–145)

## 2020-05-06 LAB — RETICULOCYTES
Immature Retic Fract: 15.1 % (ref 2.3–15.9)
RBC.: 3.27 MIL/uL — ABNORMAL LOW (ref 4.22–5.81)
Retic Count, Absolute: 38.6 10*3/uL (ref 19.0–186.0)
Retic Ct Pct: 1.2 % (ref 0.4–3.1)

## 2020-05-06 LAB — GLUCOSE, CAPILLARY
Glucose-Capillary: 139 mg/dL — ABNORMAL HIGH (ref 70–99)
Glucose-Capillary: 170 mg/dL — ABNORMAL HIGH (ref 70–99)
Glucose-Capillary: 169 mg/dL — ABNORMAL HIGH (ref 70–99)
Glucose-Capillary: 127 mg/dL — ABNORMAL HIGH (ref 70–99)
Glucose-Capillary: 151 mg/dL — ABNORMAL HIGH (ref 70–99)
Glucose-Capillary: 126 mg/dL — ABNORMAL HIGH (ref 70–99)

## 2020-05-06 LAB — VITAMIN B12: Vitamin B-12: 775 pg/mL (ref 180–914)

## 2020-05-06 MED ORDER — APIXABAN 5 MG PO TABS
5.0000 mg | ORAL_TABLET | Freq: Two times a day (BID) | ORAL | Status: DC
  Administered 2020-05-06 – 2020-05-08 (×4): 5 mg via ORAL
  Filled 2020-05-06 (×4): qty 1

## 2020-05-06 MED ORDER — FOLIC ACID 1 MG PO TABS
1.0000 mg | ORAL_TABLET | Freq: Every day | ORAL | Status: DC
  Administered 2020-05-06 – 2020-05-08 (×3): 1 mg via ORAL
  Filled 2020-05-06 (×2): qty 1

## 2020-05-06 MED ORDER — TACROLIMUS 1 MG PO CAPS
1.5000 mg | ORAL_CAPSULE | Freq: Two times a day (BID) | ORAL | Status: DC
  Administered 2020-05-06 – 2020-05-08 (×4): 1.5 mg via ORAL
  Filled 2020-05-06 (×5): qty 1

## 2020-05-06 MED ORDER — ACETAMINOPHEN 325 MG PO TABS: 650.0000 mg | ORAL_TABLET | Freq: Four times a day (QID) | ORAL | Status: DC | PRN

## 2020-05-06 MED ORDER — SODIUM BICARBONATE 650 MG PO TABS
650.0000 mg | ORAL_TABLET | Freq: Two times a day (BID) | ORAL | Status: DC
  Administered 2020-05-06 (×2): 650 mg via ORAL
  Filled 2020-05-06: qty 1

## 2020-05-06 MED ORDER — THIAMINE HCL 100 MG/ML IJ SOLN
100.0000 mg | Freq: Every day | INTRAMUSCULAR | Status: DC
  Filled 2020-05-06: qty 2

## 2020-05-06 MED ORDER — THIAMINE HCL 100 MG PO TABS
100.0000 mg | ORAL_TABLET | Freq: Every day | ORAL | Status: DC
  Administered 2020-05-06 – 2020-05-08 (×3): 100 mg via ORAL
  Filled 2020-05-06 (×2): qty 1

## 2020-05-06 MED ORDER — ACETAMINOPHEN 650 MG RE SUPP: 650.0000 mg | Freq: Four times a day (QID) | RECTAL | Status: DC | PRN

## 2020-05-06 MED ORDER — PANTOPRAZOLE SODIUM 40 MG PO PACK
40.0000 mg | PACK | Freq: Every day | ORAL | Status: DC
  Administered 2020-05-06: 40 mg via ORAL

## 2020-05-06 MED ORDER — PREDNISONE 5 MG PO TABS
5.0000 mg | ORAL_TABLET | Freq: Every day | ORAL | Status: DC
  Administered 2020-05-06 – 2020-05-08 (×3): 5 mg via ORAL
  Filled 2020-05-06 (×2): qty 1

## 2020-05-06 MED ORDER — PANTOPRAZOLE SODIUM 40 MG PO TBEC
40.0000 mg | DELAYED_RELEASE_TABLET | Freq: Every day | ORAL | Status: DC
  Administered 2020-05-07 – 2020-05-08 (×2): 40 mg via ORAL
  Filled 2020-05-06 (×2): qty 1

## 2020-05-06 MED ORDER — METOPROLOL TARTRATE 50 MG PO TABS
50.0000 mg | ORAL_TABLET | Freq: Two times a day (BID) | ORAL | Status: DC
  Administered 2020-05-06 – 2020-05-08 (×5): 50 mg via ORAL
  Filled 2020-05-06 (×4): qty 1

## 2020-05-06 MED ORDER — APIXABAN 2.5 MG PO TABS
2.5000 mg | ORAL_TABLET | Freq: Two times a day (BID) | ORAL | Status: DC
  Administered 2020-05-06: 2.5 mg via ORAL
  Filled 2020-05-06 (×2): qty 1

## 2020-05-06 MED ORDER — ALLOPURINOL 100 MG PO TABS
200.0000 mg | ORAL_TABLET | Freq: Every day | ORAL | Status: DC
  Administered 2020-05-06 – 2020-05-08 (×3): 200 mg via ORAL
  Filled 2020-05-06 (×2): qty 2

## 2020-05-06 MED ORDER — MYCOPHENOLATE 200 MG/ML ORAL SUSPENSION: 250.0000 mg | Freq: Two times a day (BID) | ORAL | Status: DC

## 2020-05-06 MED ORDER — FLUOXETINE HCL 20 MG PO CAPS
40.0000 mg | ORAL_CAPSULE | Freq: Every day | ORAL | Status: DC
  Administered 2020-05-06 – 2020-05-08 (×3): 40 mg via ORAL
  Filled 2020-05-06 (×2): qty 2

## 2020-05-06 MED ORDER — FENTANYL CITRATE (PF) 100 MCG/2ML IJ SOLN: 25.0000 ug | INTRAMUSCULAR | Status: DC | PRN

## 2020-05-06 MED ORDER — ADULT MULTIVITAMIN W/MINERALS CH
1.0000 | ORAL_TABLET | Freq: Every day | ORAL | Status: DC
  Administered 2020-05-06 – 2020-05-08 (×3): 1 via ORAL
  Filled 2020-05-06 (×2): qty 1

## 2020-05-06 MED ORDER — ROSUVASTATIN CALCIUM 20 MG PO TABS
20.0000 mg | ORAL_TABLET | Freq: Every day | ORAL | Status: DC
  Administered 2020-05-06 – 2020-05-08 (×3): 20 mg via ORAL
  Filled 2020-05-06 (×2): qty 1

## 2020-05-06 MED ORDER — AMIODARONE HCL 200 MG PO TABS
400.0000 mg | ORAL_TABLET | Freq: Two times a day (BID) | ORAL | Status: DC
  Administered 2020-05-06: 400 mg via ORAL
  Filled 2020-05-06: qty 2

## 2020-05-06 MED ORDER — MYCOPHENOLATE MOFETIL 250 MG PO CAPS
250.0000 mg | ORAL_CAPSULE | Freq: Two times a day (BID) | ORAL | Status: DC
  Administered 2020-05-06 – 2020-05-08 (×4): 250 mg via ORAL
  Filled 2020-05-06 (×4): qty 1

## 2020-05-06 NOTE — Progress Notes (Signed)
SLP Cancellation Note  Patient Details Name: James Zimmerman MRN: [id number] DOB: [birth year]   Cancelled treatment:       Reason Eval/Treat Not Completed: SLP screened, no needs identified, will sign off. Passed [facility]ale, doing well per RN.    James Zimmerman, Katherene Ponto 05/06/2020, 2:25 PM

## 2020-05-06 NOTE — TOC Progression Note (Signed)
Transition of Care [facility]) - Progression Note    Patient Details  Name: James Zimmerman MRN: [id number] Date of Birth: Apr 14, 1951  Transition of Care [facility]) CM/SW [place], [place] Phone Number: 05/06/2020, 2:14 PM  Clinical Narrative:     Change of commitment sent to [facility]. No longer under IVC. Commitment change in physical chart.        Expected Discharge Plan and Services                                                 Social Determinants of Health (SDOH) Interventions    Readmission Risk Interventions No flowsheet data found.

## 2020-05-06 NOTE — Procedures (Signed)
Extubation Procedure Note  Patient Details:   Name: James Zimmerman DOB: 08-08-1951 MRN: [id number]   Airway Documentation:    Vent end date: 05/06/20 Vent end time: 0843   Evaluation  O2 sats: stable throughout Complications: No apparent complications Patient did tolerate procedure well. Bilateral Breath Sounds: Diminished   Yes   Order received for extubation.  Patient with positive cuff leak prior to extubation.  Extubated to 4l [place].  Patient able to vocalize post extubation; no stridor noted.  No complications noted.  Phillis Knack [facility] 05/06/2020, 8:43 AM

## 2020-05-06 NOTE — Progress Notes (Addendum)
[facility] DAILY PROGRESS NOTE  S:Seen resting in bed, extubated, wife at bedside is very happy with his progress overnight. Had some more Afib with RVR overnight- got started on diltiazem drip and had some hypotension.  UOP was down slightly but now up this AM   O:BP 114/63 (BP Location: Right Arm)   Pulse 99   Temp 98.6 F (37 C) (Oral)   Resp (!) 21   Ht 6' (1.829 m)   Wt 116.2 kg   SpO2 100%   BMI 34.74 kg/m   Intake/Output Summary (Last 24 hours) at 05/06/2020 0903 Last data filed at 05/06/2020 0840 Gross per 24 hour  Intake 1713.46 ml  Output 1400 ml  Net 313.46 ml   Intake/Output: I/O last 3 completed shifts: In: 2905.6 [I.V.:579.1; IV Piggyback:2326.5] Out: 600 [Urine:600]  Intake/Output this shift:  Total I/O In: 43.2 [I.V.:43.2] Out: 1000 [Urine:1000] Weight change:   PHYSICAL EXAM: Gen: No apparent distress, nontoxic-appearing CVS: Irregularly irregular rhythm, rate 105 bpm, no murmurs Resp: Moving air well, labored breathing while sleeping due to OSA, mild crackles appreciated to left lower lobe Abd: Soft, nontender, normal bowel sounds appreciated Ext: No deformity, some bruising most likely due to apixaban  Recent Labs  Lab 05/02/20 1207 05/02/20 1207 05/02/20 1251 05/02/20 1844 05/03/20 0219 05/04/20 0210 05/05/20 0436 05/05/20 0513 05/05/20 0553  NA 128*   < > 128* 126* 129* 135 140 140 139  K 4.6   < > 4.5 5.8* 5.3* 5.8* 4.2 4.2 3.7  CL 98  --   --   --  99 105 105 105  --   CO2 18*  --   --   --  16* 15* 24 25  --   GLUCOSE 62*  --   --   --  189* 207* 278* 242*  --   BUN 49*  --   --   --  53* 45* 31* 31*  --   CREATININE 3.33*  --   --   --  3.08* 2.34* 2.23* 2.22*  --   ALBUMIN 2.9*  --   --   --  2.8* 2.7* 2.9* 2.9*  --   CALCIUM 8.6*  --   --   --  8.3* 8.4* 9.8 9.4  --   PHOS  --   --   --   --   --  3.6 3.6  --   --   AST 89*  --   --   --  80*  --   --  64*  --   ALT 40  --   --   --  42  --   --  41  --    < > =  values in this interval not displayed.   Liver Function Tests: Recent Labs  Lab 05/02/20 1207 05/02/20 1207 05/03/20 0219 05/03/20 0219 05/04/20 0210 05/05/20 0436 05/05/20 0513  AST 89*  --  80*  --   --   --  64*  ALT 40  --  42  --   --   --  41  ALKPHOS 69  --  74  --   --   --  69  BILITOT 1.5*  --  1.5*  --   --   --  1.1  PROT 5.7*  --  5.7*  --   --   --  5.6*  ALBUMIN 2.9*   < > 2.8*   < > 2.7* 2.9* 2.9*   < > =  values in this interval not displayed.   Recent Labs  Lab 05/02/20 1207  LIPASE 171*   Recent Labs  Lab 05/02/20 1244  AMMONIA 9   CBC: Recent Labs  Lab 05/02/20 1207 05/02/20 1251 05/03/20 0219 05/03/20 0219 05/04/20 0524 05/04/20 0524 05/05/20 0437 05/05/20 0513 05/05/20 0553  WBC 8.7   < > 6.8   < > 6.8  --  13.2* 8.9  --   NEUTROABS  --   --   --   --   --   --   --  6.9  --   HGB 11.5*   < > 11.3*   < > 10.8*   < > 12.1* 12.0* 9.9*  HCT 36.3*   < > 34.8*   < > 33.6*   < > 38.3* 38.4* 29.0*  MCV 99.7  --  99.1  --  98.8  --  100.0 101.9*  --   PLT 107*   < > 106*   < > 126*  --  235 201  --    < > = values in this interval not displayed.   Cardiac Enzymes: No results for input(s): CKTOTAL, CKMB, CKMBINDEX, TROPONINI in the last 168 hours. CBG: Recent Labs  Lab 05/05/20 1551 05/05/20 1928 05/05/20 2320 05/06/20 0312 05/06/20 0741  GLUCAP 133* 137* 163* 139* 170*    Iron Studies: No results for input(s): IRON, TIBC, TRANSFERRIN, FERRITIN in the last 72 hours. Studies/Results: CT HEAD WO CONTRAST  Result Date: 05/05/2020 CLINICAL DATA:  Altered mental status. Admitted for UTI. Cardiac breath this morning. EXAM: CT HEAD WITHOUT CONTRAST TECHNIQUE: Contiguous axial images were obtained from the base of the skull through the vertex without intravenous contrast. COMPARISON:  MR brain dated March 09, 2018. FINDINGS: Brain: No evidence of acute infarction, hemorrhage, hydrocephalus, extra-axial collection or mass lesion/mass effect.  Mild-to-moderate generalized cerebral atrophy, slightly advanced for age. Scattered mild periventricular and subcortical white matter hypodensities are nonspecific, but favored to reflect chronic microvascular ischemic changes. Vascular: Calcified atherosclerosis at the skullbase. No hyperdense vessel. Skull: Normal. Negative for fracture or focal lesion. Sinuses/Orbits: Air-fluid levels with right posterior ethmoid air cell and both sphenoid sinuses. The orbits are unremarkable. Other: None. IMPRESSION: 1.  No acute intracranial abnormality. Electronically Signed   By: Titus Dubin M.D.   On: 05/05/2020 06:53   DG CHEST PORT 1 VIEW  Result Date: 05/06/2020 CLINICAL DATA:  Pneumonia. EXAM: PORTABLE CHEST 1 VIEW COMPARISON:  Fist x-ray from yesterday. FINDINGS: Unchanged endotracheal tube. New feeding tube entering the stomach with the tip below the field of view. Stable cardiomediastinal silhouette. Normal pulmonary vascularity. Slightly improved aeration of the right lung base. Unchanged left basilar opacity and probable small pleural effusion. No pneumothorax. No acute osseous abnormality. IMPRESSION: 1. Unchanged left lower lobe atelectasis versus infiltrate with probable small pleural effusion. 2. Mildly improved aeration of the right lung base. Electronically Signed   By: Titus Dubin M.D.   On: 05/06/2020 07:30   DG CHEST PORT 1 VIEW  Result Date: 05/05/2020 CLINICAL DATA:  Intubation EXAM: PORTABLE CHEST 1 VIEW COMPARISON:  Two days ago FINDINGS: Endotracheal tube tip is between the clavicular heads and carina. Low volume chest with dense and streaky opacities at the bases. Left diaphragm is now obscured. There may be a left pleural effusion. No pulmonary edema or pneumothorax. Artifact from EKG leads IMPRESSION: 1. Interval bilateral pneumonia or atelectasis. 2. New endotracheal tube in good position. Electronically Signed   By: Angelica Chessman  Watts M.D.   On: 05/05/2020 06:11   . allopurinol  200  mg Per Tube Daily  . chlorhexidine gluconate (MEDLINE KIT)  15 mL Mouth Rinse BID  . Chlorhexidine Gluconate Cloth  6 each Topical Daily  . FLUoxetine  40 mg Per Tube Daily  . folic acid  1 mg Per Tube Daily  . heparin  5,000 Units Subcutaneous Q8H  . insulin aspart  0-15 Units Subcutaneous Q4H  . mouth rinse  15 mL Mouth Rinse 10 times per day  . metoprolol tartrate  50 mg Per Tube BID  . multivitamin with minerals  1 tablet Per Tube Daily  . mycophenolate  250 mg Per Tube BID  . pantoprazole sodium  40 mg Per Tube Daily  . predniSONE  5 mg Per Tube Q breakfast  . rosuvastatin  20 mg Per Tube Daily  . sodium bicarbonate  650 mg Per Tube BID  . sodium chloride flush  3 mL Intravenous Once  . tacrolimus  1.5 mg Per Tube BID  . thiamine  100 mg Per Tube Daily   Or  . thiamine  100 mg Intravenous Daily    BMET    Component Value Date/Time   NA 139 05/05/2020 0553   NA 138 02/27/2020 0000   K 3.7 05/05/2020 0553   CL 105 05/05/2020 0513   CO2 25 05/05/2020 0513   GLUCOSE 242 (H) 05/05/2020 0513   BUN 31 (H) 05/05/2020 0513   BUN 39 (A) 02/27/2020 0000   CREATININE 2.22 (H) 05/05/2020 0513   CREATININE 1.85 02/18/2018 0000   CALCIUM 9.4 05/05/2020 0513   GFRNONAA 29 (L) 05/05/2020 0513   GFRAA 34 (L) 05/05/2020 0513   CBC    Component Value Date/Time   WBC 8.9 05/05/2020 0513   RBC 3.77 (L) 05/05/2020 0513   HGB 9.9 (L) 05/05/2020 0553   HCT 29.0 (L) 05/05/2020 0553   PLT 201 05/05/2020 0513   MCV 101.9 (H) 05/05/2020 0513   MCH 31.8 05/05/2020 0513   MCHC 31.3 05/05/2020 0513   RDW 14.4 05/05/2020 0513   LYMPHSABS 1.2 05/05/2020 0513   MONOABS 0.7 05/05/2020 0513   EOSABS 0.0 05/05/2020 0513   BASOSABS 0.0 05/05/2020 0513    Assessment/Plan: 1. ?UTI: UA on admission w/ small LE, RBC, >50 WBC, 100 protein. Urine culture negative for growth at 3 days, CT suggestive of cystitis. Being treated with rocephin  at present 2. A on CRF: In the setting of above plus  concern over volume depletion/contrast- Less likely rejection-  improved with fluids/rocephin. Cr improving, baseline 1.8. No indications for dialysis- oral bicarb - will need to go home on it.  On his home doses of prograf/cellcept and pred-  dont feel like he was on high dose steroids long enough to have cortisol def but if low BP cont may need again  3. S/p transplant: Placed on IV higher dose steroids initially, now back to his home dose.  Cont maintenance prograf/cellcept. stopped the high dose steroids due to his high K, Resume pred 5 mg, could his episode been from steroids causing psychosis?  4. HTN/volume: not overloaded, is on metoprolol and dilt drip 5. Hyponatremia:Improved, continue to monitor 6. Hyperkalemia, resolved: Improved  7. Altered mentation  Alcohol withdrawal:High risk for withdrawal, On xanax and prozac, receiving Ativan, continue thiamine and folate-  Meds may have precipitated the events of 5/12 early AM  James Zimmerman, [place], PGY-2 05/06/2020 9:05 AM   Patient  seen and examined, agree with above note with above modifications. More stable this AM-  Now extubated.  Has had intermittent Afib and some hypotension overnight- variable UOP but good right now.  crt down to 2.05 which is great.  Continue with anti rejection meds.  Now it seems it is management of his Afib that would keep him here.  Would probably be best to keep in ICU to monitor his MS overnight and any needs for IS drugs  Corliss Parish, MD 05/06/2020

## 2020-05-06 NOTE — Evaluation (Signed)
Physical Therapy Evaluation Patient Details Name: James Zimmerman MRN: [id number] DOB: [birth year] Today's Date: 05/06/2020   History of Present Illness  Patient is a i1 y.o. male with CKD Stage IIIb ( baseline creat 1.8-1.9 in 10/2019) PCKD s/p kidney transplant in 2004 on maintenance immune modulation meds, IIDM, Gout, EtOH abuse presented with worsening urinary symptoms dysuria/hematuria, episode of confusion and agitation as well as increasing shortness of breath. Found to have cystitis w/ sepsis, AKI and encephalopathy.. Agitation requiring sedation and resultant respiratory compromise requiring intubation 5/12, pt extubated AM 5/13.  Clinical Impression  Patient presents with mobility limited due to general weakness, imbalance and decreased activity tolerance.  Currently on 4L O2 after extubation this am, demonstrates mild SOB with up to chair.  Patient previously independent and with supportive family.  Feel should progress to home with family support and may or may not need follow up HHPT.  PT to follow acutely.     Follow Up Recommendations Home health PT;Supervision/Assistance - 24 hour    Equipment Recommendations  None recommended by PT    Recommendations for Other Services       Precautions / Restrictions Precautions Precautions: Fall      Mobility  Bed Mobility Overal bed mobility: Needs Assistance Bed Mobility: Supine to Sit     Supine to sit: HOB elevated;Min assist     General bed mobility comments: pulled up with HHA  Transfers Overall transfer level: Needs assistance Equipment used: Rolling walker (2 wheeled) Transfers: Sit to/from [facility] Sit to Stand: Min assist;+2 safety/equipment Stand pivot transfers: Min assist;+2 safety/equipment       General transfer comment: assist for balance, used RW initially, then lifting it up as too short for him to turn and back up to chair guarding assist for balance and  lines/safety  Ambulation/Gait                Stairs            Wheelchair Mobility    Modified Rankin (Stroke Patients Only)       Balance Overall balance assessment: Needs assistance Sitting-balance support: Feet supported Sitting balance-Leahy Scale: Good     Standing balance support: No upper extremity supported;During functional activity Standing balance-Leahy Scale: Poor Standing balance comment: min to minguard A needed while turning to get to chair                             Pertinent Vitals/Pain Pain Assessment: Faces Faces Pain Scale: Hurts even more Pain Location: hurts all over especially hips Pain Descriptors / Indicators: Aching;Sore Pain Intervention(s): Monitored during session;Repositioned    Home Living Family/patient expects to be discharged to:: Private residence Living Arrangements: Spouse/significant other Available Help at Discharge: Family Type of Home: House Home Access: Level entry     Home Layout: Two level;Able to live on main level with bedroom/bathroom Home Equipment: Gilford Rile - 2 wheels;Cane - single point;Bedside commode      Prior Function Level of Independence: Independent         Comments: not using devices prior to admission, but fell 3 weeks ago with skin tear on arm     Hand Dominance        Extremity/Trunk Assessment   Upper Extremity Assessment Upper Extremity Assessment: RUE deficits/detail RUE Deficits / Details: reports R shoulder dislocates, but AROM WFL    Lower Extremity Assessment Lower Extremity Assessment: Generalized weakness  Communication   Communication: Other (comment)(raspy following extubation)  Cognition Arousal/Alertness: Awake/alert Behavior During Therapy: WFL for tasks assessed/performed Overall Cognitive Status: Impaired/Different from baseline Area of Impairment: Orientation                 Orientation Level: Disoriented to;Place(stated in  [name] initially, but with time corrected to [name])                    General Comments General comments (skin integrity, edema, etc.): on 4L O2, initial SpO2 100%, not reading at end of session    Exercises     Assessment/Plan    PT Assessment Patient needs continued PT services  PT Problem List Decreased strength;Decreased activity tolerance;Decreased mobility;Decreased balance;Decreased knowledge of use of DME;Decreased safety awareness       PT Treatment Interventions DME instruction;Therapeutic activities;Balance training;Patient/family education;Therapeutic exercise;Functional mobility training;Gait training    PT Goals (Current goals can be found in the Care Plan section)  Acute Rehab PT Goals Patient Stated Goal: happy to get to chair today, agrees to work toward walking when able PT Goal Formulation: With patient Time For Goal Achievement: 05/20/20 Potential to Achieve Goals: Good    Frequency Min 3X/week   Barriers to discharge        Co-evaluation               AM-PAC PT "6 Clicks" Mobility  Outcome Measure Help needed turning from your back to your side while in a flat bed without using bedrails?: A Little Help needed moving from lying on your back to sitting on the side of a flat bed without using bedrails?: A Little Help needed moving to and from a bed to a chair (including a wheelchair)?: A Little Help needed standing up from a chair using your arms (e.g., wheelchair or bedside chair)?: A Little Help needed to walk in hospital room?: A Little Help needed climbing 3-5 steps with a railing? : A Little 6 Click Score: 18    End of Session Equipment Utilized During Treatment: Gait belt;Oxygen Activity Tolerance: Patient limited by fatigue Patient left: in chair;with call bell/phone within reach;with chair alarm set;with nursing/sitter in room Nurse Communication: Mobility status PT Visit Diagnosis: Other abnormalities of gait and mobility  (R26.89);Muscle weakness (generalized) (M62.81)    Time: 1140-1200 PT Time Calculation (min) (ACUTE ONLY): 20 min   Charges:   PT Evaluation $PT Eval Moderate Complexity: [place], [place] Acute Rehabilitation Services [phone] 05/06/2020   Reginia Naas 05/06/2020, 12:17 PM

## 2020-05-06 NOTE — Progress Notes (Signed)
NAME:  James Zimmerman, MRN:  [id number], DOB:  1951/06/03, LOS: 4 ADMISSION DATE:  05/02/2020, CONSULTATION DATE: 05/05/2020 REFERRING MD: Dr. Maren Beach, CHIEF COMPLAINT: Respiratory arrest  Brief History   69 year old male with history of renal transplant alcohol abuse admitted 5/9 with suspected urosepsis.  Early morning 5/12 he became violently agitated, followed by obtundation and respiratory arrest.  He was intubated by anesthesia and PCCM was consulted.  History of present illness   69 year old male with past medical history as below, which is significant for polycystic kidney disease status post kidney transplant 2004, diabetes, alcohol abuse, and chronic kidney disease. He was recently admitted in [facility] where he was treated for UTI, but left after two days against medical advice. He then presented a few days later to [facility] ED with complaints of altered sensorium and fever. He was admitted for sepsis with presumed urinary source and treated with empiric antibiotics. CT abdomen described no acute issue. Renal doppler WNL. Course complicated by AFRVR. He had been improving with antibiotics and IVF hydration until the early AM hours of 5/12 when he became confused, agitated, and even violent with staff. He attempted to run out of the hospital. There was concern for ETOH withdrawal and he was treated with ativan and haldol. He unfortunately became less responsive and suffered periods of apnea progressing to respiratory arrest. He was emergently intubated and PCCM was consulted.   Past Medical History   has a past medical history of Anxiety and depression, Chronic renal insufficiency, stage III (moderate), DDD (degenerative disc disease), lumbar, Deep vein thrombosis (DVT) of left upper extremity ([place]), Diabetes mellitus with complication ([place]), Diverticulosis, Gastritis (01/2017), GERD (gastroesophageal reflux disease), Glaucoma, Gout, H/O nonmelanoma skin cancer,  H/O polycystic kidney disease, History of depression, History of kidney transplant, History of retinal detachment, adenomatous colonic polyps (02/2012; 05/2014; 08/2017), Hyperlipemia, mixed, Hypertension, and Osteoarthritis. Former smoker  [place] [facility] Events   5/9 admit 5/12 intubated, tx to ICU  Consults:  Cardiology Nephrology PCCM  Procedures:  ETT 5/12 >  Significant Diagnostic Tests:  CT abdomen 5/9 > Normal appearance of renal transplant on this unenhanced exam. No evidence of hydronephrosis. 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 Mild diffuse wall thickening of urinary bladder, which may be due to cystitis. Two small low-attenuation liver lesions which cannot be characterized on this unenhanced exam Echo 5/9 > LVEF 60-65%. Grossly normal exam.  US renal 5/9 > Unremarkable  [facility] 5/12 >> no acute intracranial abnormality    Micro Data:  Blood Cx 5/9 >>> ngtd Urine 5/9> neg  Antimicrobials:  Ceftriaxone 5/9 > 5/13  Interim history/subjective:  Placed on cardizem gtt overnight for afib with rvr Has been requiring 4 pt restraints given ongoing agitation and pt trying to kick at staff at times Remains on precedex gtt Remains afebrile Wife at bedside states he has not had any ETOH in 7-8 days  Objective   Blood pressure 123/79, pulse 87, temperature 98.7 F (37.1 C), temperature source Axillary, resp. rate (!) 28, height 6' (1.829 m), weight 116.2 kg, SpO2 98 %.    Vent Mode: CPAP;PSV FiO2 (%):  [40 %-80 %] 40 % Set Rate:  [20 bmp] 20 bmp Vt Set:  [620 mL] 620 mL PEEP:  [5 cmH20-8 cmH20] 5 cmH20 Pressure Support:  [5 cmH20] 5 cmH20 Plateau Pressure:  [14 cmH20-17 cmH20] 14 cmH20   Intake/Output Summary (Last 24 hours) at 05/06/2020 0756 Last data filed at 05/06/2020 0700 Gross  per 24 hour  Intake 1693.9 ml  Output 400 ml  Net 1293.9 ml   Filed Weights   05/02/20 1154 05/02/20 2135 05/05/20 0455  Weight: 113.4 kg 117.5 kg  116.2 kg    Examination:  precedex was stopped, since restarted at half dose given severe agitation General:  Elderly appearing male intermittently restless on MV HEENT: MM pink/moist, no active oral bleeding, ETT 8.0 at 25 at lip , R nare cortrak, pupils 3/reactive Neuro: Awake, agitated- wants ETT out- reaches for it, will follow commands and is re-directable, MAE CV:  Irir, rate controlled, no murmur PULM:  Currently on PSV 5/5 doing well, slight tachypnea but that is likely related to his agitation, coarse bs throughout, no wheeze GI: obese, soft, +bs, condom catheter Extremities: warm/dry, no edema  Skin: scattered bruising to arms, abd area   - CXR 5/13 stable, slight improvement in right basilar atelectasis, stable LLL inflitrate +/- atelectasis  - am labs are having to be redrawan  [facility] Problem list     Assessment & Plan:   Acute hypoxemic respiratory failure: Secondary to upper airway obstruction after sedation vs aspiration event. Bleeding felt to be secondary to nasal trumpet. All since resolved Probable undiagnosed OSA L sided pleural effusion - tolerating SBT well this am, agitation could be a barrier but will hopefully be able to extubate on low dose precedex  - VAP bundle - intermittent CXR   Acute toxic encephalopathy- ddx sundowning vs possible Alcohol withdrawal vs acute delirium vs metabolic related to urosepsis - wife reports no drinks since 5/4, [place].   - continue precedex with prn fentanyl for RASS goal 0-/1 with bowel regimen - ongoing Neurochecks - CT head 5/12 non acute - add empiric thiamine/ folate  Urosepsis - UC negative - on day 5/5 of ceftriaxone - remains hemodynamically stable, goal MAP > 65  AKI CKD Polycystic kidney disease s/p transplant - nephrology following, appreciate assistance - am labs reordered - I/Os inaccurate, unmeasured occurences with condom cath -  Continuing prednisone, cellcept, prograf  Atrial fibrillation with RVR, new onset - CHA2DS2-VASc score 4 - Cardiology following, appreciate assistance -  Amiodarone and Eliquis held by cards 5/12 -  Placed on cardizem overnight for Afib with RVR, rate currently controlled but still in Afib  -  Goal Mag > 2, K >4    Liver lesions: incidental finding on CT abdomen - Outpatient follow up with MRI  DM- controlled - CBG monitoring and SSI  Macrocytic anemia  - send anemia panel   Best practice:  Diet: NPO Pain/Anxiety/Delirium protocol (if indicated): PAD precedex/ prn fentanyl VAP protocol (if indicated): per protocol DVT prophylaxis: heparin SQ GI prophylaxis: NA Glucose control: SSI Mobility: BR Code Status: FULL Family Communication: Wife updated at bedside Disposition: ICU  Labs   CBC: Recent Labs  Lab 05/02/20 1207 05/02/20 1251 05/03/20 0219 05/04/20 0524 05/05/20 0437 05/05/20 0513 05/05/20 0553  WBC 8.7  --  6.8 6.8 13.2* 8.9  --   NEUTROABS  --   --   --   --   --  6.9  --   HGB 11.5*   < > 11.3* 10.8* 12.1* 12.0* 9.9*  HCT 36.3*   < > 34.8* 33.6* 38.3* 38.4* 29.0*  MCV 99.7  --  99.1 98.8 100.0 101.9*  --   PLT 107*  --  106* 126* 235 201  --    < > =  values in this interval not displayed.    Basic Metabolic Panel: Recent Labs  Lab 05/02/20 1207 05/02/20 1251 05/03/20 0219 05/04/20 0210 05/05/20 0436 05/05/20 0513 05/05/20 0553  NA 128*   < > 129* 135 140 140 139  K 4.6   < > 5.3* 5.8* 4.2 4.2 3.7  CL 98  --  99 105 105 105  --   CO2 18*  --  16* 15* 24 25  --   GLUCOSE 62*  --  189* 207* 278* 242*  --   BUN 49*  --  53* 45* 31* 31*  --   CREATININE 3.33*  --  3.08* 2.34* 2.23* 2.22*  --   CALCIUM 8.6*  --  8.3* 8.4* 9.8 9.4  --   PHOS  --   --   --  3.6 3.6  --   --    < > = values in this interval not displayed.   GFR: Estimated Creatinine Clearance: 41.3 mL/min (A) (by C-G formula based on SCr of 2.22 mg/dL (H)). Recent Labs  Lab  05/03/20 0219 05/04/20 0524 05/05/20 0437 05/05/20 0513  WBC 6.8 6.8 13.2* 8.9    Liver Function Tests: Recent Labs  Lab 05/02/20 1207 05/03/20 0219 05/04/20 0210 05/05/20 0436 05/05/20 0513  AST 89* 80*  --   --  64*  ALT 40 42  --   --  41  ALKPHOS 69 74  --   --  69  BILITOT 1.5* 1.5*  --   --  1.1  PROT 5.7* 5.7*  --   --  5.6*  ALBUMIN 2.9* 2.8* 2.7* 2.9* 2.9*   Recent Labs  Lab 05/02/20 1207  LIPASE 171*   Recent Labs  Lab 05/02/20 1244  AMMONIA 9    ABG    Component Value Date/Time   PHART 7.431 05/05/2020 0553   PCO2ART 40.4 05/05/2020 0553   PO2ART 72 (L) 05/05/2020 0553   HCO3 26.8 05/05/2020 0553   TCO2 28 05/05/2020 0553   ACIDBASEDEF 8.7 (H) 05/03/2020 1152   O2SAT 95.0 05/05/2020 0553     Coagulation Profile: Recent Labs  Lab 05/02/20 1207  INR 1.1    Cardiac Enzymes: No results for input(s): CKTOTAL, CKMB, CKMBINDEX, TROPONINI in the last 168 hours.  HbA1C: Hemoglobin A1C  Date/Time Value Ref Range Status  02/27/2020 12:00 AM 5.7  Final  02/18/2018 12:00 AM 5.8  Final   HbA1c, POC (controlled diabetic range)  Date/Time Value Ref Range Status  08/30/2018 10:46 AM 6.0 0.0 - 7.0 % Final   Hgb A1c MFr Bld  Date/Time Value Ref Range Status  05/02/2020 05:04 PM 5.9 (H) 4.8 - 5.6 % Final    Comment:    (NOTE) Pre diabetes:          5.7%-6.4% Diabetes:              >6.4% Glycemic control for   <7.0% adults with diabetes   06/23/2019 09:10 AM 6.1 4.6 - 6.5 % Final    Comment:    Glycemic Control Guidelines for People with Diabetes:Non Diabetic:  <6%Goal of Therapy: <7%Additional Action Suggested:  >8%     CBG: Recent Labs  Lab 05/05/20 1551 05/05/20 1928 05/05/20 2320 05/06/20 0312 05/06/20 0741  GLUCAP 133* 137* 163* 139* 170*   CCT: 35 mins  Kennieth Rad, MSN, AGACNP-BC [facility] 05/06/2020, 7:59 AM  See Shea Evans for personal pager PCCM on call pager [phone]

## 2020-05-06 NOTE — Progress Notes (Signed)
Subjective:  Awake and agitated and wants ET tube out.   Intake/Output from previous day:  I/O last 3 completed shifts: In: 2905.6 [I.V.:579.1; IV Piggyback:2326.5] Out: 600 [Urine:600] Total I/O In: 43.2 [I.V.:43.2] Out: 1000 [Urine:1000]  Blood pressure 114/63, pulse 99, temperature 98.6 F (37 C), temperature source Oral, resp. rate (!) 21, height 6' (1.829 m), weight 116.2 kg, SpO2 100 %. Physical Exam  Constitutional: He appears well-developed and well-nourished. He is intubated.  Cardiovascular: Normal rate, normal heart sounds and intact distal pulses. An irregularly irregular rhythm present. Exam reveals no gallop.  No murmur heard. No leg edema, no JVD.  Pulmonary/Chest: Effort normal and breath sounds normal. He is intubated.  Abdominal: Soft. Bowel sounds are normal.   Lab Results: BMP BNP (last 3 results) No results for input(s): BNP in the last 8760 hours.  ProBNP (last 3 results) No results for input(s): PROBNP in the last 8760 hours. BMP Latest Ref Rng & Units 05/05/2020 05/05/2020 05/05/2020  Glucose 70 - 99 mg/dL - 242(H) 278(H)  BUN 8 - 23 mg/dL - 31(H) 31(H)  Creatinine 0.61 - 1.24 mg/dL - 2.22(H) 2.23(H)  Sodium 135 - 145 mmol/L 139 140 140  Potassium 3.5 - 5.1 mmol/L 3.7 4.2 4.2  Chloride 98 - 111 mmol/L - 105 105  CO2 22 - 32 mmol/L - 25 24  Calcium 8.9 - 10.3 mg/dL - 9.4 9.8   Hepatic Function Latest Ref Rng & Units 05/05/2020 05/05/2020 05/04/2020  Total Protein 6.5 - 8.1 g/dL 5.6(L) - -  Albumin 3.5 - 5.0 g/dL 2.9(L) 2.9(L) 2.7(L)  AST 15 - 41 U/L 64(H) - -  ALT 0 - 44 U/L 41 - -  Alk Phosphatase 38 - 126 U/L 69 - -  Total Bilirubin 0.3 - 1.2 mg/dL 1.1 - -  Bilirubin, Direct 0.0 - 0.3 mg/dL - - -   CBC Latest Ref Rng & Units 05/05/2020 05/05/2020 05/05/2020  WBC 4.0 - 10.5 K/uL - 8.9 13.2(H)  Hemoglobin 13.0 - 17.0 g/dL 9.9(L) 12.0(L) 12.1(L)  Hematocrit 39.0 - 52.0 % 29.0(L) 38.4(L) 38.3(L)  Platelets 150 - 400 K/uL - 201 235   Lipid Panel      Component Value Date/Time   CHOL 157 02/27/2020 0000   TRIG 184 (A) 02/27/2020 0000   HDL 55 02/27/2020 0000   CHOLHDL 3 06/23/2019 0910   VLDL 49.2 (H) 06/23/2019 0910   LDLCALC 71 02/27/2020 0000   LDLDIRECT 91.0 06/23/2019 0910   Cardiac Panel (last 3 results) No results for input(s): CKTOTAL, CKMB, TROPONINI, RELINDX in the last 72 hours.  HEMOGLOBIN A1C Lab Results  Component Value Date   HGBA1C 5.9 (H) 05/02/2020   MPG 122.63 05/02/2020   TSH Recent Labs    05/05/20 0437  TSH 2.579   Imaging: CT HEAD WO CONTRAST  Result Date: 05/05/2020 CLINICAL DATA:  Altered mental status. Admitted for UTI. Cardiac breath this morning. EXAM: CT HEAD WITHOUT CONTRAST TECHNIQUE: Contiguous axial images were obtained from the base of the skull through the vertex without intravenous contrast. COMPARISON:  MR brain dated March 09, 2018. FINDINGS: Brain: No evidence of acute infarction, hemorrhage, hydrocephalus, extra-axial collection or mass lesion/mass effect. Mild-to-moderate generalized cerebral atrophy, slightly advanced for age. Scattered mild periventricular and subcortical white matter hypodensities are nonspecific, but favored to reflect chronic microvascular ischemic changes. Vascular: Calcified atherosclerosis at the skullbase. No hyperdense vessel. Skull: Normal. Negative for fracture or focal lesion. Sinuses/Orbits: Air-fluid levels with right posterior ethmoid air cell and both sphenoid  sinuses. The orbits are unremarkable. Other: None. IMPRESSION: 1.  No acute intracranial abnormality. Electronically Signed   By: Titus Dubin M.D.   On: 05/05/2020 06:53   DG CHEST PORT 1 VIEW  Result Date: 05/06/2020 CLINICAL DATA:  Pneumonia. EXAM: PORTABLE CHEST 1 VIEW COMPARISON:  Fist x-ray from yesterday. FINDINGS: Unchanged endotracheal tube. New feeding tube entering the stomach with the tip below the field of view. Stable cardiomediastinal silhouette. Normal pulmonary vascularity.  Slightly improved aeration of the right lung base. Unchanged left basilar opacity and probable small pleural effusion. No pneumothorax. No acute osseous abnormality. IMPRESSION: 1. Unchanged left lower lobe atelectasis versus infiltrate with probable small pleural effusion. 2. Mildly improved aeration of the right lung base. Electronically Signed   By: Titus Dubin M.D.   On: 05/06/2020 07:30   DG CHEST PORT 1 VIEW  Result Date: 05/05/2020 CLINICAL DATA:  Intubation EXAM: PORTABLE CHEST 1 VIEW COMPARISON:  Two days ago FINDINGS: Endotracheal tube tip is between the clavicular heads and carina. Low volume chest with dense and streaky opacities at the bases. Left diaphragm is now obscured. There may be a left pleural effusion. No pulmonary edema or pneumothorax. Artifact from EKG leads IMPRESSION: 1. Interval bilateral pneumonia or atelectasis. 2. New endotracheal tube in good position. Electronically Signed   By: Monte Fantasia M.D.   On: 05/05/2020 06:11    Cardiac Studies:  Echocardiogram 05/02/2020: 1. Left ventricular ejection fraction, by estimation, is 60 to 65%. The left ventricle has normal function. The left ventricle has no regional  wall motion abnormalities. There is mild left ventricular hypertrophy. Left ventricular diastolic parameters were normal.  2. Right ventricular systolic function is normal. The right ventricular size is normal.  3. Left atrial size was mildly dilated.  4. The mitral valve is normal in structure. Trivial mitral valve regurgitation. No evidence of mitral stenosis.  5. The aortic valve was not well visualized. Aortic valve regurgitation is mild to moderate. Mild aortic valve stenosis.  6. The inferior vena cava is normal in size with greater than 50% respiratory variability, suggesting right atrial pressure of 3 mmHg.  EKG:  EKG 05/04/2020: Normal sinus rhythm at rate of 79 bpm, normal axis.  Poor R wave progression, cannot exclude anteroseptal  infarct old.  Normal QT interval.  No evidence of ischemia.  No significant change from 05/02/2020.   Scheduled Meds: . allopurinol  200 mg Per Tube Daily  . chlorhexidine gluconate (MEDLINE KIT)  15 mL Mouth Rinse BID  . Chlorhexidine Gluconate Cloth  6 each Topical Daily  . FLUoxetine  40 mg Per Tube Daily  . folic acid  1 mg Per Tube Daily  . heparin  5,000 Units Subcutaneous Q8H  . insulin aspart  0-15 Units Subcutaneous Q4H  . mouth rinse  15 mL Mouth Rinse 10 times per day  . metoprolol tartrate  50 mg Per Tube BID  . multivitamin with minerals  1 tablet Per Tube Daily  . mycophenolate  250 mg Per Tube BID  . pantoprazole sodium  40 mg Per Tube Daily  . predniSONE  5 mg Per Tube Q breakfast  . rosuvastatin  20 mg Per Tube Daily  . sodium bicarbonate  650 mg Per Tube BID  . sodium chloride flush  3 mL Intravenous Once  . tacrolimus  1.5 mg Per Tube BID  . thiamine  100 mg Per Tube Daily   Or  . thiamine  100 mg Intravenous Daily   Continuous  Infusions: . cefTRIAXone (ROCEPHIN)  IV Stopped (05/05/20 1627)  . dexmedetomidine (PRECEDEX) IV infusion 0.8 mcg/kg/hr (05/06/20 0800)  . diltiazem (CARDIZEM) infusion 15 mg/hr (05/06/20 0800)   PRN Meds:.acetaminophen **OR** acetaminophen, fentaNYL (SUBLIMAZE) injection, ipratropium-albuterol, labetalol, midazolam, midazolam  Assessment/Plan:  1. Paroxysmal atrial fib..  CHA2DS2-VASc Score is 4.  Yearly risk of stroke: 4% (A, HTN, DM Vasc Dz - Ao athero by CT).   2. Alcohol abuse. 3. Altered mental status: Probably a combination of alcohol withdrawal, sundowning and acute illness contributing and patient's wife does not think alcohol is playing a role although states he drinks about a bottle of wine a day. No alcohol for the past 7 days.  4.  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 fib   Anticoagulation and patient is  about to be extubated,   BP soft. Continue Metoprolol and discontinue IV cardizem once extubated and HR stable.  I had started him on Amiodarone for AF, however after a call from pharmacist about interaction with cellcept, decided against it. If symptomatic recurrent AF, will consider sotalol.   DM is controlled.   Renal function stable Will follow.   Adrian Prows, M.D. 05/06/2020, 9:00 AM [facility] Pager: [phone] Office: [phone] If no answer: [phone]

## 2020-05-07 DIAGNOSIS — N179 Acute kidney failure, unspecified: Secondary | ICD-10-CM

## 2020-05-07 LAB — RENAL FUNCTION PANEL
Albumin: 2.8 g/dL — ABNORMAL LOW (ref 3.5–5.0)
Anion gap: 12 (ref 5–15)
BUN: 19 mg/dL (ref 8–23)
CO2: 29 mmol/L (ref 22–32)
Calcium: 8.9 mg/dL (ref 8.9–10.3)
Chloride: 99 mmol/L (ref 98–111)
Creatinine, Ser: 1.74 mg/dL — ABNORMAL HIGH (ref 0.61–1.24)
GFR calc Af Amer: 45 mL/min — ABNORMAL LOW (ref >60)
GFR calc non Af Amer: 39 mL/min — ABNORMAL LOW (ref >60)
Glucose, Bld: 146 mg/dL — ABNORMAL HIGH (ref 70–99)
Phosphorus: 1.7 mg/dL — ABNORMAL LOW (ref 2.5–4.6)
Potassium: 3.1 mmol/L — ABNORMAL LOW (ref 3.5–5.1)
Sodium: 140 mmol/L (ref 135–145)

## 2020-05-07 LAB — CBC
HCT: 35.4 % — ABNORMAL LOW (ref 39.0–52.0)
Hemoglobin: 11.5 g/dL — ABNORMAL LOW (ref 13.0–17.0)
MCH: 32.3 pg (ref 26.0–34.0)
MCHC: 32.5 g/dL (ref 30.0–36.0)
MCV: 99.4 fL (ref 80.0–100.0)
Platelets: 175 10*3/uL (ref 150–400)
RBC: 3.56 MIL/uL — ABNORMAL LOW (ref 4.22–5.81)
RDW: 14.3 % (ref 11.5–15.5)
WBC: 8.9 10*3/uL (ref 4.0–10.5)
nRBC: 0 % (ref 0.0–0.2)

## 2020-05-07 LAB — GLUCOSE, CAPILLARY
Glucose-Capillary: 147 mg/dL — ABNORMAL HIGH (ref 70–99)
Glucose-Capillary: 131 mg/dL — ABNORMAL HIGH (ref 70–99)
Glucose-Capillary: 153 mg/dL — ABNORMAL HIGH (ref 70–99)
Glucose-Capillary: 160 mg/dL — ABNORMAL HIGH (ref 70–99)
Glucose-Capillary: 197 mg/dL — ABNORMAL HIGH (ref 70–99)

## 2020-05-07 LAB — FOLATE: Folate: 15.1 ng/mL (ref >5.9)

## 2020-05-07 LAB — CULTURE, BLOOD (SINGLE)
Culture: NO GROWTH
Special Requests: ADEQUATE

## 2020-05-07 LAB — MAGNESIUM: Magnesium: 1.5 mg/dL — ABNORMAL LOW (ref 1.7–2.4)

## 2020-05-07 MED ORDER — POTASSIUM PHOSPHATES 15 MMOLE/5ML IV SOLN
30.0000 mmol | Freq: Once | INTRAVENOUS | Status: AC
  Administered 2020-05-07: 30 mmol via INTRAVENOUS
  Filled 2020-05-07: qty 10

## 2020-05-07 MED ORDER — INSULIN ASPART 100 UNIT/ML ~~LOC~~ SOLN
0.0000 [IU] | Freq: Three times a day (TID) | SUBCUTANEOUS | Status: DC
  Administered 2020-05-07 (×2): 3 [IU] via SUBCUTANEOUS
  Administered 2020-05-07: 2 [IU] via SUBCUTANEOUS
  Administered 2020-05-08: 3 [IU] via SUBCUTANEOUS

## 2020-05-07 MED ORDER — POTASSIUM CHLORIDE CRYS ER 20 MEQ PO TBCR
40.0000 meq | EXTENDED_RELEASE_TABLET | Freq: Once | ORAL | Status: AC
  Administered 2020-05-07: 40 meq via ORAL
  Filled 2020-05-07: qty 2

## 2020-05-07 MED ORDER — K PHOS MONO-SOD PHOS DI & MONO 155-852-130 MG PO TABS
250.0000 mg | ORAL_TABLET | Freq: Two times a day (BID) | ORAL | Status: AC
  Administered 2020-05-07 (×2): 250 mg via ORAL
  Filled 2020-05-07 (×2): qty 1

## 2020-05-07 MED ORDER — MAGNESIUM SULFATE 2 GM/50ML IV SOLN
2.0000 g | Freq: Once | INTRAVENOUS | Status: AC
  Administered 2020-05-07: 2 g via INTRAVENOUS
  Filled 2020-05-07: qty 50

## 2020-05-07 MED ORDER — POLYETHYLENE GLYCOL 3350 17 G PO PACK: 17.0000 g | PACK | Freq: Every day | ORAL | Status: DC | PRN

## 2020-05-07 MED ORDER — DOCUSATE SODIUM 100 MG PO CAPS: 100.0000 mg | ORAL_CAPSULE | Freq: Every day | ORAL | Status: DC | PRN

## 2020-05-07 MED ORDER — ALPRAZOLAM 0.25 MG PO TABS
0.2500 mg | ORAL_TABLET | Freq: Every evening | ORAL | Status: DC | PRN
  Administered 2020-05-07: 0.25 mg via ORAL
  Filled 2020-05-07: qty 1

## 2020-05-07 NOTE — Progress Notes (Signed)
Physical Therapy Treatment Patient Details Name: James Zimmerman MRN: [id number] DOB: December 24, 1951 Today's Date: 05/07/2020    History of Present Illness Patient is a i22 y.o. male with CKD Stage IIIb ( baseline creat 1.8-1.9 in 10/2019) PCKD s/p kidney transplant in 2004 on maintenance immune modulation meds, IIDM, Gout, EtOH abuse presented with worsening urinary symptoms dysuria/hematuria, episode of confusion and agitation as well as increasing shortness of breath. Found to have cystitis w/ sepsis, AKI and encephalopathy.. Agitation requiring sedation and resultant respiratory compromise requiring intubation 5/12, pt extubated AM 5/13.    PT Comments    Patient progressing well towards PT goals. Pt calm and cooperative during session. Jokes to mask cognitive deficits relating to safety, awareness and memory. Requires Min guard for transfers and Min A times for gait training for imbalance. Noted to go into vtach with activity that subsided with rest back to NSR. RN notified. Will need 24/7 supervision at home. Will follow.   Follow Up Recommendations  Home health PT;Supervision/Assistance - 24 hour     Equipment Recommendations  None recommended by PT    Recommendations for Other Services       Precautions / Restrictions Precautions Precautions: Fall Precaution Comments: watch HR/rhythm Restrictions Weight Bearing Restrictions: No    Mobility  Bed Mobility               General bed mobility comments: oob on arrival  Transfers Overall transfer level: Needs assistance Equipment used: None Transfers: Sit to/from Stand Sit to Stand: Min guard         General transfer comment: Min guard for safety. Stood from chair x3.  Ambulation/Gait Ambulation/Gait assistance: Min assist Gait Distance (Feet): 100 Feet(+130') Assistive device: None Gait Pattern/deviations: Step-through pattern;Decreased stride length;Wide base of support;Decreased step length - right;Decreased  step length - left   Gait velocity interpretation: 1.31 - 2.62 ft/sec, indicative of limited community ambulator General Gait Details: Short shuffling steps with wide BoS initially with cues to decrease speed. Bil knee instability. 2/4 DOE. Pt noted to go into vtach with mobility x3 and with each rest break returned to NSR. RN aware.   Stairs             Wheelchair Mobility    Modified Rankin (Stroke Patients Only)       Balance Overall balance assessment: Needs assistance Sitting-balance support: Feet supported;No upper extremity supported Sitting balance-Leahy Scale: Good     Standing balance support: During functional activity Standing balance-Leahy Scale: Fair Standing balance comment: Min guard for static standing and Min A at times for dynamic tasks                            Cognition Arousal/Alertness: Awake/alert Behavior During Therapy: WFL for tasks assessed/performed Overall Cognitive Status: Impaired/Different from baseline Area of Impairment: Orientation;Memory;Awareness                 Orientation Level: Disoriented to;Time;Situation   Memory: Decreased recall of precautions;Decreased short-term memory     Awareness: Emergent   General Comments: pt unaware of couse of events during hospitalization. pt eager to d/c home. pt states "i havent threatened the doc enough to get out of here." Pt verbalizes dizziness but lacks awareness to fall risk      Exercises      General Comments General comments (skin integrity, edema, etc.): Wife present during session; BP pre activity 110/76, bP post activity 116/87. HR in and out  of vtach with mobility.      Pertinent Vitals/Pain Pain Assessment: 0-10 Pain Score: 7  Pain Location: hurts all over especially hips Pain Descriptors / Indicators: Aching;Sore Pain Intervention(s): Repositioned;Monitored during session    Home Living Family/patient expects to be discharged to:: Private  residence Living Arrangements: Spouse/significant other Available Help at Discharge: Family Type of Home: House Home Access: Level entry   [place]: Two level;Able to live on main level with bedroom/bathroom Home Equipment: Gilford Rile - 2 wheels;Cane - single point;Bedside commode      Prior Function Level of Independence: Independent      Comments: not using devices prior to admission, but fell 3 weeks ago with skin tear on arm   PT Goals (current goals can now be found in the care plan section) Acute Rehab PT Goals Patient Stated Goal: to leave today  Progress towards PT goals: Progressing toward goals    Frequency    Min 3X/week      PT Plan Current plan remains appropriate    Co-evaluation PT/OT/SLP Co-Evaluation/Treatment: Yes Reason for Co-Treatment: Complexity of the patient's impairments (multi-system involvement);Necessary to address cognition/behavior during functional activity;For patient/therapist safety;To address functional/ADL transfers PT goals addressed during session: Mobility/safety with mobility;Balance OT goals addressed during session: ADL's and self-care;Proper use of Adaptive equipment and DME;Strengthening/ROM      AM-PAC PT "6 Clicks" Mobility   Outcome Measure  Help needed turning from your back to your side while in a flat bed without using bedrails?: A Little Help needed moving from lying on your back to sitting on the side of a flat bed without using bedrails?: A Little Help needed moving to and from a bed to a chair (including a wheelchair)?: A Little Help needed standing up from a chair using your arms (e.g., wheelchair or bedside chair)?: A Little Help needed to walk in hospital room?: A Little Help needed climbing 3-5 steps with a railing? : A Little 6 Click Score: 18    End of Session Equipment Utilized During Treatment: Gait belt Activity Tolerance: Treatment limited secondary to medical complications (Comment);Patient limited by  fatigue(HR rhythm) Patient left: in chair;with call bell/phone within reach;with chair alarm set;with family/visitor present Nurse Communication: Mobility status;Other (comment)(HR rhythm) PT Visit Diagnosis: Other abnormalities of gait and mobility (R26.89);Muscle weakness (generalized) (M62.81)     Time: [id number] PT Time Calculation (min) (ACUTE ONLY): 26 min  Charges:  $Gait Training: 8-22 mins                     Marisa Severin, PT, DPT Acute Rehabilitation Services Pager [phone] Office [place] 05/07/2020, 1:00 PM
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
## 2020-05-07 NOTE — Progress Notes (Signed)
Subjective:  Laying in bed, states that he feels the best he has in quite a while, denies dyspnea or shortness of breath or palpitations.  Intake/Output from previous day:  I/O last 3 completed shifts: In: 1584.2 [P.O.:1260; I.V.:124; IV Piggyback:200.2] Out: 6165 [Urine:6165] No intake/output data recorded.  Blood pressure 116/61, pulse 69, temperature 97.9 F (36.6 C), temperature source Oral, resp. rate 20, height 6' (1.829 m), weight 116.2 kg, SpO2 96 %. Physical Exam  Constitutional: He appears well-developed and well-nourished.  Cardiovascular: Normal rate, regular rhythm, normal heart sounds and intact distal pulses. Exam reveals no gallop.  No murmur heard. No leg edema, no JVD.  Pulmonary/Chest: Effort normal and breath sounds normal.  Abdominal: Soft. Bowel sounds are normal.   Lab Results: BMP BNP (last 3 results) No results for input(s): BNP in the last 8760 hours.  ProBNP (last 3 results) No results for input(s): PROBNP in the last 8760 hours. BMP Latest Ref Rng & Units 05/07/2020 05/06/2020 05/05/2020  Glucose 70 - 99 mg/dL 146(H) 212(H) -  BUN 8 - 23 mg/dL 19 30(H) -  Creatinine 0.61 - 1.24 mg/dL 1.74(H) 2.05(H) -  Sodium 135 - 145 mmol/L 140 137 139  Potassium 3.5 - 5.1 mmol/L 3.1(L) 3.6 3.7  Chloride 98 - 111 mmol/L 99 99 -  CO2 22 - 32 mmol/L 29 25 -  Calcium 8.9 - 10.3 mg/dL 8.9 8.6(L) -   Hepatic Function Latest Ref Rng & Units 05/07/2020 05/06/2020 05/05/2020  Total Protein 6.5 - 8.1 g/dL - - 5.6(L)  Albumin 3.5 - 5.0 g/dL 2.8(L) 2.5(L) 2.9(L)  AST 15 - 41 U/L - - 64(H)  ALT 0 - 44 U/L - - 41  Alk Phosphatase 38 - 126 U/L - - 69  Total Bilirubin 0.3 - 1.2 mg/dL - - 1.1  Bilirubin, Direct 0.0 - 0.3 mg/dL - - -   CBC Latest Ref Rng & Units 05/07/2020 05/05/2020 05/05/2020  WBC 4.0 - 10.5 K/uL 8.9 - 8.9  Hemoglobin 13.0 - 17.0 g/dL 11.5(L) 9.9(L) 12.0(L)  Hematocrit 39.0 - 52.0 % 35.4(L) 29.0(L) 38.4(L)  Platelets 150 - 400 K/uL 175 - 201   Lipid Panel    Component Value Date/Time   CHOL 157 02/27/2020 0000   TRIG 184 (A) 02/27/2020 0000   HDL 55 02/27/2020 0000   CHOLHDL 3 06/23/2019 0910   VLDL 49.2 (H) 06/23/2019 0910   LDLCALC 71 02/27/2020 0000   LDLDIRECT 91.0 06/23/2019 0910   Cardiac Panel (last 3 results) No results for input(s): CKTOTAL, CKMB, TROPONINI, RELINDX in the last 72 hours.  HEMOGLOBIN A1C Lab Results  Component Value Date   HGBA1C 5.9 (H) 05/02/2020   MPG 122.63 05/02/2020   TSH Recent Labs    05/05/20 0437  TSH 2.579   Imaging: DG CHEST PORT 1 VIEW  Result Date: 05/06/2020 CLINICAL DATA:  Pneumonia. EXAM: PORTABLE CHEST 1 VIEW COMPARISON:  Fist x-ray from yesterday. FINDINGS: Unchanged endotracheal tube. New feeding tube entering the stomach with the tip below the field of view. Stable cardiomediastinal silhouette. Normal pulmonary vascularity. Slightly improved aeration of the right lung base. Unchanged left basilar opacity and probable small pleural effusion. No pneumothorax. No acute osseous abnormality. IMPRESSION: 1. Unchanged left lower lobe atelectasis versus infiltrate with probable small pleural effusion. 2. Mildly improved aeration of the right lung base. Electronically Signed   By: Titus Dubin M.D.   On: 05/06/2020 07:30    Cardiac Studies:  Echocardiogram 05/02/2020: 1. Left ventricular ejection fraction, by estimation,  is 60 to 65%. The left ventricle has normal function. The left ventricle has no regional  wall motion abnormalities. There is mild left ventricular hypertrophy. Left ventricular diastolic parameters were normal.  2. Right ventricular systolic function is normal. The right ventricular size is normal.  3. Left atrial size was mildly dilated.  4. The mitral valve is normal in structure. Trivial mitral valve regurgitation. No evidence of mitral stenosis.  5. The aortic valve was not well visualized. Aortic valve regurgitation is mild to moderate. Mild aortic valve stenosis.   6. The inferior vena cava is normal in size with greater than 50% respiratory variability, suggesting right atrial pressure of 3 mmHg.  EKG:  EKG 05/04/2020: Normal sinus rhythm at rate of 79 bpm, normal axis.  Poor R wave progression, cannot exclude anteroseptal infarct old.  Normal QT interval.  No evidence of ischemia.  No significant change from 05/02/2020.   Telemetry 05/07/2020: Normal sinus rhythm.  Scheduled Meds: . allopurinol  200 mg Oral Daily  . apixaban  5 mg Oral BID  . Chlorhexidine Gluconate Cloth  6 each Topical Daily  . FLUoxetine  40 mg Oral Daily  . folic acid  1 mg Oral Daily  . insulin aspart  0-15 Units Subcutaneous TID AC & HS  . metoprolol tartrate  50 mg Oral BID  . multivitamin with minerals  1 tablet Oral Daily  . mycophenolate  250 mg Oral BID  . pantoprazole  40 mg Oral Daily  . phosphorus  250 mg Oral BID  . predniSONE  5 mg Oral Q breakfast  . rosuvastatin  20 mg Oral Daily  . sodium chloride flush  3 mL Intravenous Once  . tacrolimus  1.5 mg Oral BID  . thiamine  100 mg Oral Daily   Or  . thiamine  100 mg Intravenous Daily   Continuous Infusions: . potassium PHOSPHATE IVPB (in mmol) 30 mmol (05/07/20 1425)   PRN Meds:.acetaminophen **OR** acetaminophen, ALPRAZolam, docusate sodium, ipratropium-albuterol, labetalol, polyethylene glycol  Assessment/Plan:  1. Paroxysmal atrial fib..  CHA2DS2-VASc Score is 4.  Yearly risk of stroke: 4% (A, HTN, DM Vasc Dz - Ao athero by CT).   2. Alcohol abuse. 3. Altered mental status: Resolved, highly suspect alcohol withdrawal. 4.  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 twice daily and is maintaining sinus rhythm and blood pressure is also normal.  In view of interaction with tacrolimus, will hold off on amiodarone, continue Eliquis 5 mg p.o. twice daily in view  of high cardioembolic risk and recurrent paroxysmal atrial fibrillation.  He is stable from cardiac standpoint, in view of diabetes mellitus, renal issues, hyperlipidemia, I will continue to follow him in the outpatient basis.  Please call if questions.  Adrian Prows, M.D. 05/07/2020, 7:20 PM [facility], [place] Pager: [phone] Office: [phone] If no answer: [phone]

## 2020-05-07 NOTE — Plan of Care (Signed)

## 2020-05-07 NOTE — Progress Notes (Signed)
eLink Physician-Brief Progress Note Patient Name: James Zimmerman DOB: 1951/09/09 MRN: [id number]   Date of Service  05/07/2020  HPI/Events of Note  Notified for request for sleep aid Patient seen restless Patient takes xanax 0.25  eICU Interventions  Xanax 0.25 q hs prn ordered     Intervention Category Minor Interventions: Agitation / anxiety - evaluation and management  Judd Lien 05/07/2020, 1:46 AM

## 2020-05-07 NOTE — Progress Notes (Signed)
NAME:  James Zimmerman, MRN:  [id number], DOB:  Feb 11, 1951, LOS: 5 ADMISSION DATE:  05/02/2020, CONSULTATION DATE: 05/05/2020 REFERRING MD: Dr. Maren Beach, CHIEF COMPLAINT: Respiratory arrest  Brief History   69 year old male with history of renal transplant alcohol abuse admitted 5/9 with suspected urosepsis.  Early morning 5/12 he became violently agitated, followed by obtundation and respiratory arrest.  He was intubated by anesthesia and PCCM was consulted.  History of present illness   69 year old male with past medical history as below, which is significant for polycystic kidney disease status post kidney transplant 2004, diabetes, alcohol abuse, and chronic kidney disease. He was recently admitted in [facility]) where he was treated for UTI, but left after two days against medical advice. He then presented a few days later to [facility] ED with complaints of altered sensorium and fever. He was admitted for sepsis with presumed urinary source and treated with empiric antibiotics. CT abdomen described no acute issue. Renal doppler WNL. Course complicated by AFRVR. He had been improving with antibiotics and IVF hydration until the early AM hours of 5/12 when he became confused, agitated, and even violent with staff. He attempted to run out of the hospital. There was concern for ETOH withdrawal and he was treated with ativan and haldol. He unfortunately became less responsive and suffered periods of apnea progressing to respiratory arrest. He was emergently intubated and PCCM was consulted.   Past Medical History   has a past medical history of Anxiety and depression, Chronic renal insufficiency, stage III (moderate), DDD (degenerative disc disease), lumbar, Deep vein thrombosis (DVT) of left upper extremity ([place]), Diabetes mellitus with complication ([place]), Diverticulosis, Gastritis (01/2017), GERD (gastroesophageal reflux disease), Glaucoma, Gout, H/O nonmelanoma skin cancer,  H/O polycystic kidney disease, History of depression, History of kidney transplant, History of retinal detachment, adenomatous colonic polyps (02/2012; 05/2014; 08/2017), Hyperlipemia, mixed, Hypertension, and Osteoarthritis. Former smoker  [place] [facility] Events   5/9 admit 5/12 intubated, tx to ICU  Consults:  Cardiology Nephrology PCCM  Procedures:  ETT 5/12 >5/13  Significant Diagnostic Tests:  CT abdomen 5/9 > Normal appearance of renal transplant on this unenhanced exam. No evidence of hydronephrosis. 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 Mild diffuse wall thickening of urinary bladder, which may be due to cystitis. Two small low-attenuation liver lesions which cannot be characterized on this unenhanced exam Echo 5/9 > LVEF 60-65%. Grossly normal exam.  US renal 5/9 > Unremarkable  [facility] 5/12 >> no acute intracranial abnormality    Micro Data:  Blood Cx 5/9 >>> ngtd Urine 5/9> neg  Antimicrobials:  Ceftriaxone 5/9 >>  Interim history/subjective:  Extubated yesterday Received xanax overnight for restlessness - 4L I/O with net negative 3.5 L this admission. Afebrile. Precedex discontinued yesterday HR controlled A & O, off oxygen, spO2 decreased to 86% and [place] replaced. Patient denies complaints of pain or dyspnea.    Objective   Blood pressure 128/75, pulse 77, temperature 98.7 F (37.1 C), temperature source Oral, resp. rate 17, height 6' (1.829 m), weight 116.2 kg, SpO2 92 %.    Vent Mode: CPAP;PSV FiO2 (%):  [40 %] 40 % PEEP:  [5 cmH20] 5 cmH20 Pressure Support:  [5 cmH20] 5 cmH20   Intake/Output Summary (Last 24 hours) at 05/07/2020 0726 Last data filed at 05/07/2020 0500 Gross per 24 hour  Intake 1123.97 ml  Output 5540 ml  Net -4416.03 ml   Filed Weights   05/02/20 1154 05/02/20 2135 05/05/20 0455  Weight: 113.4 kg 117.5 kg 116.2 kg    Examination:  precedex was stopped, since restarted at half dose given severe  agitation General:  Adult male, resting in bed.  HEENT:  MM pink/moist, Neuro: A & O, no focal deficits, no sensation abnormalities.   CV:  Irir, rate controlled, no murmur, no edema.  2 + pulses PULM: Lungs CTA, diminished at bases, symmetric expansion GI: obese, soft, +bs Extremities: warm/dry, no edema  Skin: scattered ecchymosis to bilateral arms, leg and abdomen  Resolved Hospital Problem list     Assessment & Plan:   Acute hypoxemic respiratory failure: Secondary to upper airway obstruction after sedation vs aspiration event. Bleeding felt to be secondary to nasal trumpet. All since resolved Probable undiagnosed OSA L sided pleural effusion - Extubated 5/13 - 2 L [place], wean for goal SPO2 > 92%.  Ongoing oxygen requirements likely due to atelectasis and recommend IS, Mobilization.    Acute toxic encephalopathy- ddx sundowning vs possible Alcohol withdrawal vs acute delirium vs metabolic related to urosepsis - wife reports no drinks since 5/4, [place].   - CT head 5/12 non acute - add empiric thiamine/ folate - Continue home xanax to prevent w/d - Alert and oriented this am with no focal deficits  Urosepsis - UC negative, CT suggestive of cystitis - on day 4/5 of ceftriaxone  AKI on CKD Polycystic kidney disease s/p transplant - nephrology following, appreciate assistance - Continuing prednisone, cellcept, prograf - Sodium bicarb tablets  Atrial fibrillation with RVR, new onset - CHA2DS2-VASc score 4 - Cardiology following, appreciate assistance - Rate/rhythm control: Amiodarone discontinued 5/13. Continue metoprolol per Cards - Systemic a/c: Apixaban -  Goal Mag > 2, K >4.  Potassium, Magnesium and phos replaced this am    Liver lesions: incidental finding on CT abdomen - Outpatient follow up with MRI  DM- controlled - Change to AC/HS  Macrocytic anemia  - hgb 11.5 this am.   Best practice:  Diet: Diet  as tolerated Pain/Anxiety/Delirium protocol (if indicated): Home xanax qhs PRN VAP protocol (if indicated): per protocol DVT prophylaxis: systemic a/c with apixaban GI prophylaxis:  Glucose control: SSI Mobility: Ambulate with assistance. PT/OT Code Status: FULL Family Communication: Wife updated at bedside Disposition: Stable to transfer to floor.   Labs   CBC: Recent Labs  Lab 05/03/20 0219 05/03/20 0219 05/04/20 0524 05/05/20 0437 05/05/20 0513 05/05/20 0553 05/07/20 0500  WBC 6.8  --  6.8 13.2* 8.9  --  8.9  NEUTROABS  --   --   --   --  6.9  --   --   HGB 11.3*   < > 10.8* 12.1* 12.0* 9.9* 11.5*  HCT 34.8*   < > 33.6* 38.3* 38.4* 29.0* 35.4*  MCV 99.1  --  98.8 100.0 101.9*  --  99.4  PLT 106*  --  126* 235 201  --  175   < > = values in this interval not displayed.    Basic Metabolic Panel: Recent Labs  Lab 05/04/20 0210 05/04/20 0210 05/05/20 0436 05/05/20 0513 05/05/20 0553 05/06/20 1122 05/07/20 0500  NA 135   < > 140 140 139 137 140  K 5.8*   < > 4.2 4.2 3.7 3.6 3.1*  CL 105  --  105 105  --  99 99  CO2 15*  --  24 25  --  25 29  GLUCOSE 207*  --  278* 242*  --  212* 146*  BUN 45*  --  31* 31*  --  30* 19  CREATININE 2.34*  --  2.23* 2.22*  --  2.05* 1.74*  CALCIUM 8.4*  --  9.8 9.4  --  8.6* 8.9  MG  --   --   --   --   --   --  1.5*  PHOS 3.6  --  3.6  --   --  2.1* 1.7*   < > = values in this interval not displayed.   GFR: Estimated Creatinine Clearance: 52.7 mL/min (A) (by C-G formula based on SCr of 1.74 mg/dL (H)). Recent Labs  Lab 05/04/20 0524 05/05/20 0437 05/05/20 0513 05/07/20 0500  WBC 6.8 13.2* 8.9 8.9    Liver Function Tests: Recent Labs  Lab 05/02/20 1207 05/02/20 1207 05/03/20 0219 05/03/20 0219 05/04/20 0210 05/05/20 0436 05/05/20 0513 05/06/20 1122 05/07/20 0500  AST 89*  --  80*  --   --   --  64*  --   --   ALT 40  --  42  --   --   --  41  --   --   ALKPHOS 69  --  74  --   --   --  69  --   --   BILITOT  1.5*  --  1.5*  --   --   --  1.1  --   --   PROT 5.7*  --  5.7*  --   --   --  5.6*  --   --   ALBUMIN 2.9*   < > 2.8*   < > 2.7* 2.9* 2.9* 2.5* 2.8*   < > = values in this interval not displayed.   Recent Labs  Lab 05/02/20 1207  LIPASE 171*   Recent Labs  Lab 05/02/20 1244  AMMONIA 9    ABG    Component Value Date/Time   PHART 7.431 05/05/2020 0553   PCO2ART 40.4 05/05/2020 0553   PO2ART 72 (L) 05/05/2020 0553   HCO3 26.8 05/05/2020 0553   TCO2 28 05/05/2020 0553   ACIDBASEDEF 8.7 (H) 05/03/2020 1152   O2SAT 95.0 05/05/2020 0553     Coagulation Profile: Recent Labs  Lab 05/02/20 1207  INR 1.1    Cardiac Enzymes: No results for input(s): CKTOTAL, CKMB, CKMBINDEX, TROPONINI in the last 168 hours.  HbA1C: Hemoglobin A1C  Date/Time Value Ref Range Status  02/27/2020 12:00 AM 5.7  Final  02/18/2018 12:00 AM 5.8  Final   HbA1c, POC (controlled diabetic range)  Date/Time Value Ref Range Status  08/30/2018 10:46 AM 6.0 0.0 - 7.0 % Final   Hgb A1c MFr Bld  Date/Time Value Ref Range Status  05/02/2020 05:04 PM 5.9 (H) 4.8 - 5.6 % Final    Comment:    (NOTE) Pre diabetes:          5.7%-6.4% Diabetes:              >6.4% Glycemic control for   <7.0% adults with diabetes   06/23/2019 09:10 AM 6.1 4.6 - 6.5 % Final    Comment:    Glycemic Control Guidelines for People with Diabetes:Non Diabetic:  <6%Goal of Therapy: <7%Additional Action Suggested:  >8%     CBG: Recent Labs  Lab 05/06/20 1108 05/06/20 1511 05/06/20 1911 05/06/20 2316 05/07/20 0319  GLUCAP 169* 127* 151* 126* 147*    Paulita Fujita, ACNP [facility]   See Amion for personal pager PCCM  on call pager [phone]

## 2020-05-07 NOTE — Progress Notes (Signed)
[facility] DAILY PROGRESS NOTE  S:  Looks great-  Out of Afib it seems- crt is down to his baseline   O:BP 128/75   Pulse 77   Temp 98.7 F (37.1 C) (Oral)   Resp 17   Ht 6' (1.829 m)   Wt 116.2 kg   SpO2 92%   BMI 34.74 kg/m   Intake/Output Summary (Last 24 hours) at 05/07/2020 0802 Last data filed at 05/07/2020 0500 Gross per 24 hour  Intake 1080.76 ml  Output 5540 ml  Net -4459.24 ml   Intake/Output: I/O last 3 completed shifts: In: [id number] [P.O.:900; I.V.:611; IV Piggyback:100] Out: [id number] [Urine:5940]  Intake/Output this shift:  No intake/output data recorded. Weight change:   PHYSICAL EXAM: Gen: No apparent distress, nontoxic-appearing CVS: more regular Resp: Moving air well  Abd: Soft, nontender, normal bowel sounds appreciated Ext: No deformity, some bruising most likely due to apixaban  Recent Labs  Lab 05/02/20 1207 05/02/20 1251 05/03/20 0219 05/04/20 0210 05/05/20 0436 05/05/20 0513 05/05/20 0553 05/06/20 1122 05/07/20 0500  NA 128*   < > 129* 135 140 140 139 137 140  K 4.6   < > 5.3* 5.8* 4.2 4.2 3.7 3.6 3.1*  CL 98  --  99 105 105 105  --  99 99  CO2 18*  --  16* 15* 24 25  --  25 29  GLUCOSE 62*  --  189* 207* 278* 242*  --  212* 146*  BUN 49*  --  53* 45* 31* 31*  --  30* 19  CREATININE 3.33*  --  3.08* 2.34* 2.23* 2.22*  --  2.05* 1.74*  ALBUMIN 2.9*  --  2.8* 2.7* 2.9* 2.9*  --  2.5* 2.8*  CALCIUM 8.6*  --  8.3* 8.4* 9.8 9.4  --  8.6* 8.9  PHOS  --   --   --  3.6 3.6  --   --  2.1* 1.7*  AST 89*  --  80*  --   --  64*  --   --   --   ALT 40  --  42  --   --  41  --   --   --    < > = values in this interval not displayed.   Liver Function Tests: Recent Labs  Lab 05/02/20 1207 05/02/20 1207 05/03/20 0219 05/04/20 0210 05/05/20 0513 05/06/20 1122 05/07/20 0500  AST 89*  --  80*  --  64*  --   --   ALT 40  --  42  --  41  --   --   ALKPHOS 69  --  74  --  69  --   --   BILITOT 1.5*  --  1.5*  --  1.1  --   --   PROT  5.7*  --  5.7*  --  5.6*  --   --   ALBUMIN 2.9*   < > 2.8*   < > 2.9* 2.5* 2.8*   < > = values in this interval not displayed.   Recent Labs  Lab 05/02/20 1207  LIPASE 171*   Recent Labs  Lab 05/02/20 1244  AMMONIA 9   CBC: Recent Labs  Lab 05/03/20 0219 05/03/20 0219 05/04/20 0524 05/04/20 0524 05/05/20 0437 05/05/20 0437 05/05/20 0513 05/05/20 0553 05/07/20 0500  WBC 6.8   < > 6.8   < > 13.2*  --  8.9  --  8.9  NEUTROABS  --   --   --   --   --   --  6.9  --   --   HGB 11.3*   < > 10.8*   < > 12.1*   < > 12.0* 9.9* 11.5*  HCT 34.8*   < > 33.6*   < > 38.3*   < > 38.4* 29.0* 35.4*  MCV 99.1  --  98.8  --  100.0  --  101.9*  --  99.4  PLT 106*   < > 126*   < > 235  --  201  --  175   < > = values in this interval not displayed.   Cardiac Enzymes: No results for input(s): CKTOTAL, CKMB, CKMBINDEX, TROPONINI in the last 168 hours. CBG: Recent Labs  Lab 05/06/20 1511 05/06/20 1911 05/06/20 2316 05/07/20 0319 05/07/20 0734  GLUCAP 127* 151* 126* 147* 131*    Iron Studies:  Recent Labs    05/06/20 0933  IRON 20*  TIBC 207*  FERRITIN 246   Studies/Results: DG CHEST PORT 1 VIEW  Result Date: 05/06/2020 CLINICAL DATA:  Pneumonia. EXAM: PORTABLE CHEST 1 VIEW COMPARISON:  Fist x-ray from yesterday. FINDINGS: Unchanged endotracheal tube. New feeding tube entering the stomach with the tip below the field of view. Stable cardiomediastinal silhouette. Normal pulmonary vascularity. Slightly improved aeration of the right lung base. Unchanged left basilar opacity and probable small pleural effusion. No pneumothorax. No acute osseous abnormality. IMPRESSION: 1. Unchanged left lower lobe atelectasis versus infiltrate with probable small pleural effusion. 2. Mildly improved aeration of the right lung base. Electronically Signed   By: Titus Dubin M.D.   On: 05/06/2020 07:30   . allopurinol  200 mg Oral Daily  . apixaban  5 mg Oral BID  . Chlorhexidine Gluconate Cloth  6  each Topical Daily  . FLUoxetine  40 mg Oral Daily  . folic acid  1 mg Oral Daily  . insulin aspart  0-15 Units Subcutaneous TID AC & HS  . metoprolol tartrate  50 mg Oral BID  . multivitamin with minerals  1 tablet Oral Daily  . mycophenolate  250 mg Oral BID  . pantoprazole  40 mg Oral Daily  . phosphorus  250 mg Oral BID  . potassium chloride  40 mEq Oral Once  . predniSONE  5 mg Oral Q breakfast  . rosuvastatin  20 mg Oral Daily  . sodium bicarbonate  650 mg Oral BID  . sodium chloride flush  3 mL Intravenous Once  . tacrolimus  1.5 mg Oral BID  . thiamine  100 mg Oral Daily   Or  . thiamine  100 mg Intravenous Daily    BMET    Component Value Date/Time   NA 140 05/07/2020 0500   NA 138 02/27/2020 0000   K 3.1 (L) 05/07/2020 0500   CL 99 05/07/2020 0500   CO2 29 05/07/2020 0500   GLUCOSE 146 (H) 05/07/2020 0500   BUN 19 05/07/2020 0500   BUN 39 (A) 02/27/2020 0000   CREATININE 1.74 (H) 05/07/2020 0500   CREATININE 1.85 02/18/2018 0000   CALCIUM 8.9 05/07/2020 0500   GFRNONAA 39 (L) 05/07/2020 0500   GFRAA 45 (L) 05/07/2020 0500   CBC    Component Value Date/Time   WBC 8.9 05/07/2020 0500   RBC 3.56 (L) 05/07/2020 0500   HGB 11.5 (L) 05/07/2020 0500   HCT 35.4 (L) 05/07/2020 0500   PLT 175 05/07/2020 0500   MCV 99.4 05/07/2020 0500   MCH 32.3 05/07/2020 0500   MCHC 32.5 05/07/2020 0500   RDW 14.3  05/07/2020 0500   LYMPHSABS 1.2 05/05/2020 0513   MONOABS 0.7 05/05/2020 0513   EOSABS 0.0 05/05/2020 0513   BASOSABS 0.0 05/05/2020 0513    Assessment/Plan: 1. ?UTI: UA on admission w/ small LE, RBC, >50 WBC, 100 protein. Urine culture negative for growth at 3 days, CT suggestive of cystitis. Being treated with rocephin at present 2. A on CRF: In the setting of above plus concern over volume depletion/contrast- Less likely rejection-  improved with fluids/rocephin. Cr improving- really now back to his baseline-  Bicarb also improved-  Stop oral bicarb 3. S/p  transplant: Placed on IV higher dose steroids initially, now back to his home dose.  Cont maintenance prograf/cellcept.  4. HTN/volume: not overloaded, is on metoprolol for Afib 5. Hyponatremia:Improved,  6. Hyperkalemia, resolved: Improved - in fact today hypo-  Got repletion 7. Altered mentation  Alcohol withdrawal:High risk for withdrawal, On xanax and prozac, receiving Ativan, continue thiamine and folate-  Meds may have precipitated the events of 5/12 early AM  As renal function is back to baseline and he is on his home IS dose, renal will sign off. Anticipate he is close to D/C.  I will see that he has appropriate follow up with Dr. Wendelyn Breslow, MD 05/07/2020

## 2020-05-08 DIAGNOSIS — N3001 Acute cystitis with hematuria: Secondary | ICD-10-CM

## 2020-05-08 LAB — CBC
HCT: 36.3 % — ABNORMAL LOW (ref 39.0–52.0)
Hemoglobin: 11.5 g/dL — ABNORMAL LOW (ref 13.0–17.0)
MCH: 31.3 pg (ref 26.0–34.0)
MCHC: 31.7 g/dL (ref 30.0–36.0)
MCV: 98.9 fL (ref 80.0–100.0)
Platelets: 184 10*3/uL (ref 150–400)
RBC: 3.67 MIL/uL — ABNORMAL LOW (ref 4.22–5.81)
RDW: 14.3 % (ref 11.5–15.5)
WBC: 8 10*3/uL (ref 4.0–10.5)
nRBC: 0 % (ref 0.0–0.2)

## 2020-05-08 LAB — GLUCOSE, CAPILLARY
Glucose-Capillary: 147 mg/dL — ABNORMAL HIGH (ref 70–99)
Glucose-Capillary: 162 mg/dL — ABNORMAL HIGH (ref 70–99)
Glucose-Capillary: 173 mg/dL — ABNORMAL HIGH (ref 70–99)

## 2020-05-08 LAB — RENAL FUNCTION PANEL
Albumin: 2.7 g/dL — ABNORMAL LOW (ref 3.5–5.0)
Anion gap: 14 (ref 5–15)
BUN: 21 mg/dL (ref 8–23)
CO2: 26 mmol/L (ref 22–32)
Calcium: 8.1 mg/dL — ABNORMAL LOW (ref 8.9–10.3)
Chloride: 98 mmol/L (ref 98–111)
Creatinine, Ser: 1.75 mg/dL — ABNORMAL HIGH (ref 0.61–1.24)
GFR calc Af Amer: 45 mL/min — ABNORMAL LOW (ref >60)
GFR calc non Af Amer: 39 mL/min — ABNORMAL LOW (ref >60)
Glucose, Bld: 160 mg/dL — ABNORMAL HIGH (ref 70–99)
Phosphorus: 3.2 mg/dL (ref 2.5–4.6)
Potassium: 5.5 mmol/L — ABNORMAL HIGH (ref 3.5–5.1)
Sodium: 138 mmol/L (ref 135–145)

## 2020-05-08 LAB — MAGNESIUM: Magnesium: 1.7 mg/dL (ref 1.7–2.4)

## 2020-05-08 MED ORDER — APIXABAN 5 MG PO TABS: 5.0000 mg | ORAL_TABLET | Freq: Two times a day (BID) | ORAL | 0 refills | Status: DC

## 2020-05-08 NOTE — Discharge Summary (Signed)
Physician Discharge Summary  James Zimmerman [id number] DOB: 02-27-1951 DOA: 05/02/2020  PCP: [name], MD  Admit date: 05/02/2020 Discharge date: 05/08/2020  Time spent: 45 minutes  Recommendations for Outpatient Follow-up:  1. PCP in 1 week 2. Consider MRI abdomen in 1 month to evaluate nonspecific liver lesions 3. Cardiology Dr. Einar Gip in 1 month    Discharge Diagnoses:  Active Problems:   AKI (acute kidney injury) ([place])   Acute respiratory failure with hypoxia and hypercapnia (HCC) Sepsis UTI Status post kidney transplant Alcohol abuse Delirium Suspected EtOH withdrawal Atrial fibrillation with RVR Acute toxic encephalopathy  Discharge Condition: Stable  Diet recommendation: Heart healthy, low-sodium  Filed Weights   05/02/20 2135 05/05/20 0455 05/08/20 0404  Weight: 117.5 kg 116.2 kg 112.3 kg    History of present illness:  69 year old male with past medical history as below, which is significant for polycystic kidney disease status post kidney transplant 2004, diabetes, alcohol abuse, and chronic kidney disease. He was recently admitted in [facility]) where he was treated for UTI, but left after two days against medical advice. He then presented a few days later to [facility] ED with complaints of altered sensorium and fever. He was admitted for sepsis with presumed urinary source and treated with empiric antibiotics. CT abdomen described no acute issue. Renal doppler WNL. Course complicated by AFRVR. He had been improving with antibiotics and IVF hydration until the early AM hours of 5/12 when he became confused, agitated, and even violent with staff. He attempted to run out of the hospital. There was concern for ETOH withdrawal and he was treated with ativan and haldol. He unfortunately became less responsive and suffered periods of apnea progressing to respiratory arrest. He was emergently intubated and PCCM was consulted.     Hospital Course:   Acute hypoxemic respiratory failure:  -Likely secondary to delirium, sedation, resolved - Extubated 5/13 -Weaned off O2  Acute toxic encephalopathy- ddx sundowning vs possible Alcohol withdrawal vs acute delirium  -Acute delirium started day 3 of hospitalization, transferred to ICU, required intubation - wife reports no drinks since 5/4, [place].  - CT head 5/12 non acute -Treated with benzodiazepines, thiamine and folate -Subsequently resumed home regimen of Xanax -Resolved  Sepsis due to UTI - UC negative, CT suggestive of cystitis -Treated with 6 days of IV ceftriaxone, today's day 7, discontinue antibiotics after today's dose  AKI on CKD 3 Polycystic kidney disease s/p transplant -Creatinine was 3 on admission, secondary to sepsis -Nephrology consulted, hydrated with saline this admission, rejection was not suspected -Kidney function improved back to his baseline, it is 1.7 at the time of discharge -Nephrology recommended continuing prednisone CellCept and Prograf at home dose and following up with Dr. Posey Pronto   Atrial fibrillation with RVR, new onset - CHA2DS2-VASc score 4 -Cardiology consulted, briefly treated with amiodarone this admission -Converted back to sinus rhythm, Dr. Einar Gip recommends continuing metoprolol at discharge and he was started on apixaban for secondary stroke prevention, patient and wife advised on alcohol cessation and bleeding risks while on anticoagulation    Liver lesions: incidental finding on CT abdomen - Outpatient follow up with MRI  DM- controlled  Macrocytic anemia  - hgb 11.5 this am.    Consultations:  Cardiology  Renal  PCCM  Discharge Exam: Vitals:   05/08/20 0802 05/08/20 1003  BP: 111/68 117/69  Pulse: 80 88  Resp: 18 16  Temp: 97.9 F (36.6 C) 98.3 F (36.8 C)  SpO2: 96% 98%    General: Awake alert, oriented x3 Cardiovascular:  S1-S2, regular rhythm Respiratory: Clear  Discharge Instructions   Discharge Instructions    Diet - low sodium heart healthy   Complete by: As directed    Diet Carb Modified   Complete by: As directed    Increase activity slowly   Complete by: As directed      Allergies as of 05/08/2020      Reactions   Lipitor [atorvastatin] Other (See Comments)   Headaches   Zocor [simvastatin] Other (See Comments)   Myalgia   Penicillins Rash      Medication List    TAKE these medications   Accu-Chek SmartView test strip Generic drug: glucose blood USE TO TEST BLOOD SUGAR EVERY DAY What changed: See the new instructions.   allopurinol 100 MG tablet Commonly known as: ZYLOPRIM Take 200 mg by mouth daily.   ALPRAZolam 0.25 MG tablet Commonly known as: XANAX Take 1 tablet (0.25 mg total) by mouth daily as needed. What changed: reasons to take this   amLODipine 5 MG tablet Commonly known as: NORVASC Take 5 mg by mouth daily.   apixaban 5 MG Tabs tablet Commonly known as: ELIQUIS Take 1 tablet (5 mg total) by mouth 2 (two) times daily.   FLUoxetine 40 MG capsule Commonly known as: PROZAC Take 40 mg by mouth daily. Patient takes in am   glipiZIDE 5 MG tablet Commonly known as: GLUCOTROL Take 5 mg by mouth See admin instructions. Take 10mg  in the morning, and 5mg  in the evening.   MAGnesium-Oxide 400 (241.3 Mg) MG tablet Generic drug: magnesium oxide Take 400 mg by mouth daily.   metoprolol tartrate 50 MG tablet Commonly known as: LOPRESSOR Take 1 tablet (50 mg total) by mouth 2 (two) times daily.   mycophenolate 250 MG capsule Commonly known as: CELLCEPT Take 250 mg by mouth 2 (two) times daily.   pantoprazole 20 MG tablet Commonly known as: PROTONIX Take 20 mg by mouth every morning.   pioglitazone 45 MG tablet Commonly known as: ACTOS Take 1 tablet (45 mg total) by mouth daily.   predniSONE 5 MG tablet Commonly known as: DELTASONE Take 5 mg by mouth  daily.   rosuvastatin 20 MG tablet Commonly known as: CRESTOR TAKE 1 TABLET BY MOUTH EVERY DAY   tacrolimus 1 MG capsule Commonly known as: PROGRAF Take 1 mg by mouth 2 (two) times daily.   tacrolimus 0.5 MG capsule Commonly known as: PROGRAF Take 1 capsule by mouth 2 (two) times daily.   Zioptan 0.0015 % Soln Generic drug: Tafluprost (PF) Place 1 drop into both eyes at bedtime.      Allergies  Allergen Reactions  . Lipitor [Atorvastatin] Other (See Comments)    Headaches  . Zocor [Simvastatin] Other (See Comments)    Myalgia  . Penicillins Rash   Follow-up Information    Adrian Prows, MD. Call in 4 day(s).   Specialty: Cardiology Why: TO be seein in 2-3 weeks for atrial fibrillation Contact information: [place] [address] [place], [place] Follow up.   Why: for home health services. They wil call you in 1-2 days to set up a home appointment Contact information: [place] [address] [phone]            The results of significant  diagnostics from this hospitalization (including imaging, microbiology, ancillary and laboratory) are listed below for reference.    Significant Diagnostic Studies: CT ABDOMEN PELVIS WO CONTRAST  Result Date: 05/02/2020 CLINICAL DATA:  Abdominal pain, nausea, diarrhea. Urinary tract infection. Renal transplant patient. EXAM: CT ABDOMEN AND PELVIS WITHOUT CONTRAST TECHNIQUE: Multidetector CT imaging of the abdomen and pelvis was performed following the standard protocol without IV contrast. COMPARISON:  None. FINDINGS: Lower chest: No acute findings. Hepatobiliary: A 2.6 cm low-attenuation lesion is seen in the left hepatic lobe, and a 1.6 cm lesion is seen in the right hepatic lobe. These cannot be characterized on this unenhanced exam. Gallbladder is unremarkable. No evidence of biliary ductal dilatation. Pancreas: No mass or inflammatory  process visualized on this unenhanced exam. Spleen:  Within normal limits in size. Adrenals/Urinary tract: Normal adrenal glands. The native kidneys are markedly enlarged and diffusely involved by numerous cysts, consistent with autosomal dominant polycystic kidney disease. No evidence of ureteral calculi or hydronephrosis. Renal transplant is seen in the left iliac fossa and is unremarkable in appearance. No evidence of hydronephrosis. Urinary bladder shows mild diffuse wall thickening, and cystitis cannot be excluded. Stomach/Bowel: No evidence of obstruction, inflammatory process, or abnormal fluid collections. Normal appendix visualized. Diverticulosis is seen mainly involving the sigmoid colon, however there is no evidence of diverticulitis. Vascular/Lymphatic: No pathologically enlarged lymph nodes identified. No evidence of abdominal aortic aneurysm. Aortic atherosclerosis noted. Reproductive:  No mass or other significant abnormality. Other:  None. Musculoskeletal: No suspicious bone lesions identified. Right hip prosthesis noted. IMPRESSION: 1. Normal appearance of renal transplant on this unenhanced exam. No evidence of hydronephrosis. 2. 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 3. Mild diffuse wall thickening of urinary bladder, which may be due to cystitis. Suggest correlation with urinalysis. 4. Colonic diverticulosis, without radiographic evidence of diverticulitis. 5. Two small low-attenuation liver lesions which cannot be characterized on this unenhanced exam. Consider abdomen MRI without and with contrast for further characterization. Aortic Atherosclerosis (ICD10-I70.0). Electronically Signed   By: Marlaine Hind M.D.   On: 05/02/2020 14:22   CT HEAD WO CONTRAST  Result Date: 05/05/2020 CLINICAL DATA:  Altered mental status. Admitted for UTI. Cardiac breath this morning. EXAM: CT HEAD WITHOUT CONTRAST TECHNIQUE: Contiguous axial images were obtained from the base of  the skull through the vertex without intravenous contrast. COMPARISON:  MR brain dated March 09, 2018. FINDINGS: Brain: No evidence of acute infarction, hemorrhage, hydrocephalus, extra-axial collection or mass lesion/mass effect. Mild-to-moderate generalized cerebral atrophy, slightly advanced for age. Scattered mild periventricular and subcortical white matter hypodensities are nonspecific, but favored to reflect chronic microvascular ischemic changes. Vascular: Calcified atherosclerosis at the skullbase. No hyperdense vessel. Skull: Normal. Negative for fracture or focal lesion. Sinuses/Orbits: Air-fluid levels with right posterior ethmoid air cell and both sphenoid sinuses. The orbits are unremarkable. Other: None. IMPRESSION: 1.  No acute intracranial abnormality. Electronically Signed   By: Titus Dubin M.D.   On: 05/05/2020 06:53   US Renal Transplant w/Doppler  Result Date: 05/02/2020 CLINICAL DATA:  Acute kidney injury. Renal transplant in 2004 for polycystic kidney disease. EXAM: ULTRASOUND OF RENAL TRANSPLANT WITH RENAL DOPPLER ULTRASOUND TECHNIQUE: Ultrasound examination of the renal transplant was performed with gray-scale, color and duplex doppler evaluation. COMPARISON:  None. FINDINGS: Transplant kidney location: Left lower quadrant Transplant Kidney: Renal measurements: 12.5 x 6.4 x 6.7 cm = volume: 261mL. Normal in size and parenchymal echogenicity. No evidence of mass or hydronephrosis. No peri-transplant fluid collection seen. Color flow  in the main renal artery:  Yes Color flow in the main renal vein:  Yes Duplex Doppler Evaluation: Main Renal Artery Resistive Index: 0.79 (normal 0.6-0.8; equivocal 0.8-0.9; abnormal >= 0.9) Venous waveform in main renal vein:  Present Intrarenal resistive index in upper pole:  0.57 (normal 0.6-0.8; equivocal 0.8-0.9; abnormal >= 0.9) Intrarenal resistive index in lower pole: 0.83 (normal 0.6-0.8; equivocal 0.8-0.9; abnormal >= 0.9) Bladder: Normal for  degree of bladder distention. Other findings:  None. IMPRESSION: Normal appearance of renal transplant and left lower quadrant. No evidence of hydronephrosis. Unremarkable Doppler ultrasound of renal transplant, with arterial resistive index values within normal limits. Electronically Signed   By: Marlaine Hind M.D.   On: 05/02/2020 14:35   DG CHEST PORT 1 VIEW  Result Date: 05/06/2020 CLINICAL DATA:  Pneumonia. EXAM: PORTABLE CHEST 1 VIEW COMPARISON:  Fist x-ray from yesterday. FINDINGS: Unchanged endotracheal tube. New feeding tube entering the stomach with the tip below the field of view. Stable cardiomediastinal silhouette. Normal pulmonary vascularity. Slightly improved aeration of the right lung base. Unchanged left basilar opacity and probable small pleural effusion. No pneumothorax. No acute osseous abnormality. IMPRESSION: 1. Unchanged left lower lobe atelectasis versus infiltrate with probable small pleural effusion. 2. Mildly improved aeration of the right lung base. Electronically Signed   By: Titus Dubin M.D.   On: 05/06/2020 07:30   DG CHEST PORT 1 VIEW  Result Date: 05/05/2020 CLINICAL DATA:  Intubation EXAM: PORTABLE CHEST 1 VIEW COMPARISON:  Two days ago FINDINGS: Endotracheal tube tip is between the clavicular heads and carina. Low volume chest with dense and streaky opacities at the bases. Left diaphragm is now obscured. There may be a left pleural effusion. No pulmonary edema or pneumothorax. Artifact from EKG leads IMPRESSION: 1. Interval bilateral pneumonia or atelectasis. 2. New endotracheal tube in good position. Electronically Signed   By: Monte Fantasia M.D.   On: 05/05/2020 06:11   DG Chest Port 1 View  Result Date: 05/03/2020 CLINICAL DATA:  69 year old male with increase shortness of breath, decreased oxygen saturation, lethargy. EXAM: PORTABLE CHEST 1 VIEW COMPARISON:  Chest radiographs 07/27/2012 and earlier. FINDINGS: Portable AP semi upright view at 1213 hours. Lung  volumes and mediastinal contours remain within normal limits. Visualized tracheal air column is within normal limits. No pneumothorax. Allowing for portable technique the lungs are clear. No acute osseous abnormality identified. IMPRESSION: Negative portable chest. Electronically Signed   By: Genevie Ann M.D.   On: 05/03/2020 12:22   ECHOCARDIOGRAM COMPLETE  Result Date: 05/02/2020    ECHOCARDIOGRAM REPORT   Patient Name:   BRAIJON GIFFEN Date of Exam: 05/02/2020 Medical Rec #:  [id number]          Height:       72.0 in Accession #:    [id number]         Weight:       250.0 lb Date of Birth:  [birth year]          BSA:          2.343 m Patient Age:    67 years           BP:           109/65 mmHg Patient Gender: M                  HR:           80 bpm. Exam Location:  Inpatient Procedure: 2D Echo Indications:    dyspnea 786.09  History:        Patient has no prior history of Echocardiogram examinations. End                 stage renal disease; Risk Factors:Diabetes, Dyslipidemia and                 Hypertension.  Sonographer:    Johny Chess Referring Phys: [id number] [place]  1. Left ventricular ejection fraction, by estimation, is 60 to 65%. The left ventricle has normal function. The left ventricle has no regional wall motion abnormalities. There is mild left ventricular hypertrophy. Left ventricular diastolic parameters were normal.  2. Right ventricular systolic function is normal. The right ventricular size is normal.  3. Left atrial size was mildly dilated.  4. The mitral valve is normal in structure. Trivial mitral valve regurgitation. No evidence of mitral stenosis.  5. The aortic valve was not well visualized. Aortic valve regurgitation is mild to moderate. Mild aortic valve stenosis.  6. The inferior vena cava is normal in size with greater than 50% respiratory variability, suggesting right atrial pressure of 3 mmHg. FINDINGS  Left Ventricle: Left ventricular ejection fraction, by  estimation, is 60 to 65%. The left ventricle has normal function. The left ventricle has no regional wall motion abnormalities. The left ventricular internal cavity size was normal in size. There is  mild left ventricular hypertrophy. Left ventricular diastolic parameters were normal. Right Ventricle: The right ventricular size is normal. Right vetricular wall thickness was not assessed. Right ventricular systolic function is normal. Left Atrium: Left atrial size was mildly dilated. Right Atrium: Right atrial size was normal in size. Pericardium: There is no evidence of pericardial effusion. Mitral Valve: The mitral valve is normal in structure. Trivial mitral valve regurgitation. No evidence of mitral valve stenosis. Tricuspid Valve: The tricuspid valve is normal in structure. Tricuspid valve regurgitation is not demonstrated. No evidence of tricuspid stenosis. Aortic Valve: The aortic valve was not well visualized. Aortic valve regurgitation is mild to moderate. Aortic regurgitation PHT measures 224 msec. Mild aortic stenosis is present. Aortic valve mean gradient measures 13.0 mmHg. Aortic valve peak gradient  measures 22.8 mmHg. Aortic valve area, by VTI measures 2.03 cm. Pulmonic Valve: The pulmonic valve was not well visualized. Pulmonic valve regurgitation is not visualized. No evidence of pulmonic stenosis. Aorta: The aortic root is normal in size and structure. Pulmonary Artery: Indeterminant PASP, inadequate TR jet. Venous: The inferior vena cava is normal in size with greater than 50% respiratory variability, suggesting right atrial pressure of 3 mmHg. IAS/Shunts: No atrial level shunt detected by color flow Doppler.  LEFT VENTRICLE PLAX 2D LVIDd:         5.10 cm  Diastology LVIDs:         3.70 cm  LV e' lateral:   10.30 cm/s LV PW:         1.20 cm  LV E/e' lateral: 7.6 LV IVS:        1.20 cm  LV e' medial:    8.05 cm/s LVOT diam:     2.50 cm  LV E/e' medial:  9.7 LV SV:         99 LV SV Index:   42 LVOT  Area:     4.91 cm  RIGHT VENTRICLE             IVC RV S prime:     15.90 cm/s  IVC diam: 1.90 cm TAPSE (M-mode): 1.9 cm LEFT ATRIUM  Index       RIGHT ATRIUM           Index LA diam:        4.70 cm  2.01 cm/m  RA Area:     18.70 cm LA Vol (A2C):   102.0 ml 43.54 ml/m RA Volume:   51.00 ml  21.77 ml/m LA Vol (A4C):   77.1 ml  32.91 ml/m LA Biplane Vol: 91.4 ml  39.02 ml/m  AORTIC VALVE AV Area (Vmax):    2.05 cm AV Area (Vmean):   1.79 cm AV Area (VTI):     2.03 cm AV Vmax:           239.00 cm/s AV Vmean:          171.000 cm/s AV VTI:            0.487 m AV Peak Grad:      22.8 mmHg AV Mean Grad:      13.0 mmHg LVOT Vmax:         99.79 cm/s LVOT Vmean:        62.430 cm/s LVOT VTI:          0.201 m LVOT/AV VTI ratio: 0.41 AI PHT:            224 msec  AORTA Ao Root diam: 3.30 cm MITRAL VALVE MV Area (PHT): 3.91 cm    SHUNTS MV Decel Time: 194 msec    Systemic VTI:  0.20 m MV E velocity: 78.40 cm/s  Systemic Diam: 2.50 cm MV A velocity: 58.70 cm/s MV E/A ratio:  1.34 Carlyle Dolly MD Electronically signed by Carlyle Dolly MD Signature Date/Time: 05/02/2020/4:54:28 PM    Final     Microbiology: Recent Results (from the past 240 hour(s))  Culture, blood (single) w Reflex to ID Panel     Status: None   Collection Time: 05/02/20 12:43 PM   Specimen: BLOOD  Result Value Ref Range Status   Specimen Description BLOOD LEFT ANTECUBITAL  Final   Special Requests   Final    BOTTLES DRAWN AEROBIC AND ANAEROBIC Blood Culture adequate volume   Culture   Final    NO GROWTH 5 DAYS Performed at [facility], [address] [place], [place] [address]    Report Status 05/07/2020 FINAL  Final  Urine culture     Status: None   Collection Time: 05/02/20 12:44 PM   Specimen: Urine, Random  Result Value Ref Range Status   Specimen Description URINE, RANDOM  Final   Special Requests NONE  Final   Culture   Final    NO GROWTH Performed at [facility], [place] [address],  [place], [place] [address]    Report Status 05/03/2020 FINAL  Final  Respiratory Panel by RT PCR (Flu A&B, Covid) - Nasopharyngeal Swab     Status: None   Collection Time: 05/02/20  8:54 PM   Specimen: Nasopharyngeal Swab  Result Value Ref Range Status   SARS Coronavirus 2 by RT PCR NEGATIVE NEGATIVE Final    Comment: (NOTE) SARS-CoV-2 target nucleic acids are NOT DETECTED. The SARS-CoV-2 RNA is generally detectable in upper respiratoy specimens during the acute phase of infection. The lowest concentration of SARS-CoV-2 viral copies this assay can detect is 131 copies/mL. A negative result does not preclude SARS-Cov-2 infection and should not be used as the sole basis for treatment or other patient management decisions. A negative result may occur with  improper specimen collection/handling, submission of  specimen other than nasopharyngeal swab, presence of viral mutation(s) within the areas targeted by this assay, and inadequate number of viral copies (<131 copies/mL). A negative result must be combined with clinical observations, patient history, and epidemiological information. The expected result is Negative. Fact Sheet for Patients:  PinkCheek.be Fact Sheet for Healthcare Providers:  GravelBags.it This test is not yet ap proved or cleared by the [place] FDA and  has been authorized for detection and/or diagnosis of SARS-CoV-2 by FDA under an Emergency Use Authorization (EUA). This EUA will remain  in effect (meaning this test can be used) for the duration of the COVID-19 declaration under Section 564(b)(1) of the Act, 21 U.S.C. section 360bbb-3(b)(1), unless the authorization is terminated or revoked sooner.    Influenza A by PCR NEGATIVE NEGATIVE Final   Influenza B by PCR NEGATIVE NEGATIVE Final    Comment: (NOTE) The Xpert Xpress SARS-CoV-2/FLU/RSV assay is intended as an aid in  the diagnosis of influenza from  Nasopharyngeal swab specimens and  should not be used as a sole basis for treatment. Nasal washings and  aspirates are unacceptable for Xpert Xpress SARS-CoV-2/FLU/RSV  testing. Fact Sheet for Patients: PinkCheek.be Fact Sheet for Healthcare Providers: GravelBags.it This test is not yet approved or cleared by the [place] FDA and  has been authorized for detection and/or diagnosis of SARS-CoV-2 by  FDA under an Emergency Use Authorization (EUA). This EUA will remain  in effect (meaning this test can be used) for the duration of the  Covid-19 declaration under Section 564(b)(1) of the Act, 21  U.S.C. section 360bbb-3(b)(1), unless the authorization is  terminated or revoked. Performed at [facility], [place] [address] [place], [place] [address]   MRSA PCR Screening     Status: None   Collection Time: 05/02/20  9:23 PM   Specimen: Nasal Mucosa; Nasopharyngeal  Result Value Ref Range Status   MRSA by PCR NEGATIVE NEGATIVE Final    Comment:        The GeneXpert MRSA Assay (FDA approved for NASAL specimens only), is one component of a comprehensive MRSA colonization surveillance program. It is not intended to diagnose MRSA infection nor to guide or monitor treatment for MRSA infections. Performed at [facility], [place] [address], [place], [place] [address]      Labs: Basic Metabolic Panel: Recent Labs  Lab 05/04/20 0210 05/04/20 0210 05/05/20 [id number] 05/05/20 0436 05/05/20 0513 05/05/20 0553 05/06/20 1122 05/07/20 0500 05/08/20 0743  NA 135   < > 140   < > 140 139 137 140 138  K 5.8*   < > 4.2   < > 4.2 3.7 3.6 3.1* 5.5*  CL 105   < > 105  --  105  --  99 99 98  CO2 15*   < > 24  --  25  --  25 29 26   GLUCOSE 207*   < > 278*  --  242*  --  212* 146* 160*  BUN 45*   < > 31*  --  31*  --  30* 19 21  CREATININE 2.34*   < > 2.23*  --  2.22*  --  2.05* 1.74* 1.75*  CALCIUM 8.4*   < > 9.8  --  9.4  --   8.6* 8.9 8.1*  MG  --   --   --   --   --   --   --  1.5* 1.7  PHOS 3.6  --  3.6  --   --   --  2.1* 1.7* 3.2   < > = values in this interval not displayed.   Liver Function Tests: Recent Labs  Lab 05/02/20 1207 05/02/20 1207 05/03/20 0219 05/04/20 0210 05/05/20 0436 05/05/20 0513 05/06/20 1122 05/07/20 0500 05/08/20 0743  AST 89*  --  80*  --   --  64*  --   --   --   ALT 40  --  42  --   --  41  --   --   --   ALKPHOS 69  --  74  --   --  69  --   --   --   BILITOT 1.5*  --  1.5*  --   --  1.1  --   --   --   PROT 5.7*  --  5.7*  --   --  5.6*  --   --   --   ALBUMIN 2.9*   < > 2.8*   < > 2.9* 2.9* 2.5* 2.8* 2.7*   < > = values in this interval not displayed.   Recent Labs  Lab 05/02/20 1207  LIPASE 171*   Recent Labs  Lab 05/02/20 1244  AMMONIA 9   CBC: Recent Labs  Lab 05/04/20 0524 05/04/20 0524 05/05/20 0437 05/05/20 0513 05/05/20 0553 05/07/20 0500 05/08/20 0743  WBC 6.8  --  13.2* 8.9  --  8.9 8.0  NEUTROABS  --   --   --  6.9  --   --   --   HGB 10.8*   < > 12.1* 12.0* 9.9* 11.5* 11.5*  HCT 33.6*   < > 38.3* 38.4* 29.0* 35.4* 36.3*  MCV 98.8  --  100.0 101.9*  --  99.4 98.9  PLT 126*  --  235 201  --  175 184   < > = values in this interval not displayed.   Cardiac Enzymes: No results for input(s): CKTOTAL, CKMB, CKMBINDEX, TROPONINI in the last 168 hours. BNP: BNP (last 3 results) No results for input(s): BNP in the last 8760 hours.  ProBNP (last 3 results) No results for input(s): PROBNP in the last 8760 hours.  CBG: Recent Labs  Lab 05/07/20 1634 05/07/20 2029 05/08/20 0033 05/08/20 0417 05/08/20 0845  GLUCAP 160* 197* 162* 147* 173*       Signed:  Domenic Polite MD.  [facility] 05/08/2020, 1:35 PM

## 2020-05-08 NOTE — Discharge Instructions (Signed)

## 2020-05-08 NOTE — TOC Transition Note (Signed)
Transition of Care [facility]) - CM/SW Discharge Note   Patient Details  Name: James Zimmerman MRN: [id number] Date of Birth: Dec 15, 1951  Transition of Care [facility]) CM/SW Contact:  Carles Collet, RN Phone Number: 05/08/2020, 9:31 AM   Clinical Narrative:   Damaris Schooner w patient's wife. Discussed [facility] provider ratings. [facility] accepted referral No other CM needs identified.     Final next level of care: [place] Barriers to Discharge: No Barriers Identified   Patient Goals and CMS Choice Patient states their goals for this hospitalization and ongoing recovery are:: to go home CMS Medicare.gov Compare Post Acute Care list provided to:: Other (Comment Required) Choice offered to / list presented to : Spouse  Discharge Placement                       Discharge Plan and Services                          HH Arranged: PT, OT [facility] Agency: [place] ([facility]) Date [facility] Agency Contacted: 05/08/20 Time [place]: [phone] Representative spoke with at [place]: [place] ([place]) Interventions     Readmission Risk Interventions No flowsheet data found.

## 2020-05-08 NOTE — Progress Notes (Signed)
Went over discharge instructions with patient and patient's wife at the bedside. Patient expresses he has no questions and is aware of the follow up with home health and cardiology. IV is taken out and CCMD has been notified.

## 2020-05-08 NOTE — Progress Notes (Signed)
Occupational Therapy Treatment Patient Details Name: James Zimmerman MRN: [id number] DOB: 1951/12/21 Today's Date: 05/08/2020    History of present illness Patient is a i1 y.o. male with CKD Stage IIIb ( baseline creat 1.8-1.9 in 10/2019) PCKD s/p kidney transplant in 2004 on maintenance immune modulation meds, IIDM, Gout, EtOH abuse presented with worsening urinary symptoms dysuria/hematuria, episode of confusion and agitation as well as increasing shortness of breath. Found to have cystitis w/ sepsis, AKI and encephalopathy.. Agitation requiring sedation and resultant respiratory compromise requiring intubation 5/12, pt extubated AM 5/13.   OT comments   Pt currently with functional limitations due to the deficits listed below (see OT Problem List). Pt at this time required min guard  For LE dressing and set up to supervision with UE dressing as tends to stand with dressing. Pt at this time was able to complete sit to stand transfers with supervision to min guard due to safety.  Pt will benefit from skilled OT to increase their safety and independence with ADL and functional mobility for ADL to facilitate discharge to venue listed below.     Follow Up Recommendations  Home health OT    Equipment Recommendations  Other (comment)(dues to occaional times unsteadiness on feet and distracted to use RW and follow HH recommendations with progress)    Recommendations for Other Services      Precautions / Restrictions Precautions Precautions: Fall Precaution Comments: watch HR/rhythm Restrictions Weight Bearing Restrictions: No       Mobility Bed Mobility Overal bed mobility: Needs Assistance Bed Mobility: Supine to Sit     Supine to sit: Supervision;HOB elevated        Transfers Overall transfer level: Needs assistance Equipment used: None Transfers: Sit to/from Stand Sit to Stand: Min guard         General transfer comment: cued for safety     Balance Overall  balance assessment: Needs assistance Sitting-balance support: Feet supported;No upper extremity supported Sitting balance-Leahy Scale: Good                                     ADL either performed or assessed with clinical judgement   ADL Overall ADL's : Needs assistance/impaired Eating/Feeding: Independent;Sitting   Grooming: Wash/dry hands;Wash/dry face;Supervision/safety;Standing   Upper Body Bathing: Min guard;Sitting;Cueing for safety;Cueing for sequencing   Lower Body Bathing: Min guard;Sit to/from stand   Upper Body Dressing : Supervision/safety;Sitting;Standing;Cueing for safety;Cueing for sequencing   Lower Body Dressing: Min guard;Sit to/from stand   Toilet Transfer: Min guard;Grab Environmental education officer and Hygiene: Min guard;Cueing for safety;Cueing for sequencing;Sit to/from stand   Tub/ Shower Transfer: Minimal assistance;Cueing for safety;Cueing for sequencing   Functional mobility during ADLs: Min guard;Cueing for safety;Cueing for sequencing General ADL Comments: pt cued for safety      Vision   Vision Assessment?: No apparent visual deficits   Perception     Praxis      Cognition Arousal/Alertness: Awake/alert Behavior During Therapy: WFL for tasks assessed/performed Overall Cognitive Status: Impaired/Different from baseline Area of Impairment: Orientation;Memory;Awareness                 Orientation Level: Disoriented to;Time;Situation   Memory: Decreased short-term memory     Awareness: Emergent            Exercises     Shoulder Instructions       General Comments decrease skin  integrity    Pertinent Vitals/ Pain       Pain Assessment: Faces Faces Pain Scale: Hurts a little bit Pain Location: hurts all over especially hips Pain Descriptors / Indicators: Aching;Sore Pain Intervention(s): Limited activity within patient's tolerance;Monitored during session  Home Living                                           Prior Functioning/Environment              Frequency  Min 2X/week        Progress Toward Goals  OT Goals(current goals can now be found in the care plan section)     Acute Rehab OT Goals Patient Stated Goal: to leave today  OT Goal Formulation: With patient/family Time For Goal Achievement: 05/21/20 Potential to Achieve Goals: Good ADL Goals Pt Will Transfer to Toilet: with modified independence;ambulating Pt Will Perform Tub/Shower Transfer: ambulating;Shower transfer Additional ADL Goal #1: Pt will complete DGi wtih score >19 to indicate decr fall with adls  Plan Discharge plan remains appropriate    Co-evaluation                 AM-PAC OT "6 Clicks" Daily Activity     Outcome Measure   Help from another person eating meals?: None Help from another person taking care of personal grooming?: None Help from another person toileting, which includes using toliet, bedpan, or urinal?: A Little Help from another person bathing (including washing, rinsing, drying)?: A Little Help from another person to put on and taking off regular upper body clothing?: A Little Help from another person to put on and taking off regular lower body clothing?: A Little 6 Click Score: 20    End of Session Equipment Utilized During Treatment: Gait belt  OT Visit Diagnosis: Unsteadiness on feet (R26.81);Muscle weakness (generalized) (M62.81)   Activity Tolerance Patient tolerated treatment well   Patient Left in chair;with call bell/phone within reach;with family/visitor present   Nurse Communication          Time: [id number] OT Time Calculation (min): 22 min  Charges: OT General Charges $OT Visit: 1 Visit OT Treatments $Self Care/Home Management : 8-22 mins  Joeseph Amor OTR/L  [place]  [phone] office number [phone] pager number    Joeseph Amor 05/08/2020, 10:59 AM

## 2020-05-10 ENCOUNTER — Encounter: Payer: Self-pay | Admitting: Family Medicine

## 2020-05-10 ENCOUNTER — Telehealth: Payer: Self-pay

## 2020-05-10 LAB — TACROLIMUS LEVEL: Tacrolimus (FK506) - LabCorp: 12.7 ng/mL (ref 2.0–20.0)

## 2020-05-10 NOTE — Telephone Encounter (Signed)
Spoke with wife, Izora Gala.   Transition Care Management Follow-up Telephone Call  Admission: 05/02/2020-05/08/2020 Diagnosis: Dehydration, Acute cystitis   How have you been since you were released from the hospital? "He's weak, but resting"    Do you understand why you were in the hospital? yes   Do you understand the discharge instructions? yes   Where were you discharged to? Home. Resides with family.   Items Reviewed:  Medications reviewed: yes, Pt has not started Eliquis yet. Hesitant d/t increased bruising/bleeding.   Allergies reviewed: yes  Dietary changes reviewed: yes  Referrals reviewed: yes, Cardiology f/u scheduled.    Functional Questionnaire:   Activities of Daily Living (ADLs):   He states they are independent in the following: ambulation, bathing and hygiene, feeding, continence, grooming, toileting and dressing States they require assistance with the following: None. Home Health visits for PT/OT.   Any transportation issues/concerns?: no   Any patient concerns? no   Confirmed importance and date/time of follow-up visits scheduled yes  Provider Appointment booked with PCP 05/20/2020 @ 2:30pm, in person.   Confirmed with patient if condition begins to worsen call PCP or go to the ER.  Patient was given the office number and encouraged to call back with question or concerns.  : yes

## 2020-05-10 NOTE — Telephone Encounter (Signed)
Noted: nurse phone contact with patient for TCM. Signed:  Crissie Sickles, MD           05/10/2020

## 2020-05-14 ENCOUNTER — Telehealth: Payer: Self-pay

## 2020-05-14 NOTE — Telephone Encounter (Signed)
Okay for PT orders 

## 2020-05-14 NOTE — Telephone Encounter (Signed)
Asking for PT orders for 2x week for 3 weeks and 1x week for 5 weeks. Pt has been refusing Pt has been refusing PT but the PT will try again next week to visit with patient.

## 2020-05-14 NOTE — Telephone Encounter (Signed)
Home health called in wanting to check on the orders for this patient. She was not able to let me know the orders as she says they were not in front of here at the moment. Let her know nurse would get back with her on Monday    Please call and advise

## 2020-05-16 NOTE — Telephone Encounter (Signed)
Yes okay 

## 2020-05-17 NOTE — Telephone Encounter (Signed)
Left message to return call for verbal.

## 2020-05-17 NOTE — Telephone Encounter (Signed)
Magda Paganini returned call and given verbal okay per PCP.

## 2020-05-18 ENCOUNTER — Telehealth: Payer: Self-pay

## 2020-05-18 DIAGNOSIS — N1832 Chronic kidney disease, stage 3b: Secondary | ICD-10-CM

## 2020-05-18 DIAGNOSIS — F1010 Alcohol abuse, uncomplicated: Secondary | ICD-10-CM

## 2020-05-18 DIAGNOSIS — I129 Hypertensive chronic kidney disease with stage 1 through stage 4 chronic kidney disease, or unspecified chronic kidney disease: Secondary | ICD-10-CM | POA: Diagnosis not present

## 2020-05-18 DIAGNOSIS — Z7952 Long term (current) use of systemic steroids: Secondary | ICD-10-CM

## 2020-05-18 DIAGNOSIS — A419 Sepsis, unspecified organism: Secondary | ICD-10-CM | POA: Diagnosis not present

## 2020-05-18 DIAGNOSIS — F419 Anxiety disorder, unspecified: Secondary | ICD-10-CM

## 2020-05-18 DIAGNOSIS — K2970 Gastritis, unspecified, without bleeding: Secondary | ICD-10-CM

## 2020-05-18 DIAGNOSIS — M5136 Other intervertebral disc degeneration, lumbar region: Secondary | ICD-10-CM

## 2020-05-18 DIAGNOSIS — Z940 Kidney transplant status: Secondary | ICD-10-CM | POA: Diagnosis not present

## 2020-05-18 DIAGNOSIS — F329 Major depressive disorder, single episode, unspecified: Secondary | ICD-10-CM

## 2020-05-18 DIAGNOSIS — Z7984 Long term (current) use of oral hypoglycemic drugs: Secondary | ICD-10-CM

## 2020-05-18 DIAGNOSIS — N179 Acute kidney failure, unspecified: Secondary | ICD-10-CM | POA: Diagnosis not present

## 2020-05-18 DIAGNOSIS — E1122 Type 2 diabetes mellitus with diabetic chronic kidney disease: Secondary | ICD-10-CM

## 2020-05-18 DIAGNOSIS — M1030 Gout due to renal impairment, unspecified site: Secondary | ICD-10-CM

## 2020-05-18 DIAGNOSIS — N183 Chronic kidney disease, stage 3 unspecified: Secondary | ICD-10-CM | POA: Diagnosis not present

## 2020-05-18 DIAGNOSIS — M1990 Unspecified osteoarthritis, unspecified site: Secondary | ICD-10-CM

## 2020-05-18 DIAGNOSIS — N390 Urinary tract infection, site not specified: Secondary | ICD-10-CM | POA: Diagnosis not present

## 2020-05-18 DIAGNOSIS — D539 Nutritional anemia, unspecified: Secondary | ICD-10-CM

## 2020-05-18 DIAGNOSIS — E11319 Type 2 diabetes mellitus with unspecified diabetic retinopathy without macular edema: Secondary | ICD-10-CM

## 2020-05-18 DIAGNOSIS — I480 Paroxysmal atrial fibrillation: Secondary | ICD-10-CM

## 2020-05-18 DIAGNOSIS — K5790 Diverticulosis of intestine, part unspecified, without perforation or abscess without bleeding: Secondary | ICD-10-CM

## 2020-05-18 DIAGNOSIS — H409 Unspecified glaucoma: Secondary | ICD-10-CM

## 2020-05-18 DIAGNOSIS — K769 Liver disease, unspecified: Secondary | ICD-10-CM

## 2020-05-18 DIAGNOSIS — Q613 Polycystic kidney, unspecified: Secondary | ICD-10-CM

## 2020-05-18 DIAGNOSIS — Z7901 Long term (current) use of anticoagulants: Secondary | ICD-10-CM

## 2020-05-18 DIAGNOSIS — E785 Hyperlipidemia, unspecified: Secondary | ICD-10-CM

## 2020-05-18 LAB — COMPREHENSIVE METABOLIC PANEL
Albumin: 3.7 (ref 3.5–5.0)
Calcium: 9 (ref 8.7–10.7)
GFR calc Af Amer: 41
GFR calc non Af Amer: 36

## 2020-05-18 LAB — BASIC METABOLIC PANEL
BUN: 24 — AB (ref 4–21)
CO2: 20 (ref 13–22)
Chloride: 101 (ref 99–108)
Creatinine: 1.9 — AB (ref 0.6–1.3)
Glucose: 171
Potassium: 4.3 (ref 3.4–5.3)
Sodium: 137 (ref 137–147)

## 2020-05-18 NOTE — Telephone Encounter (Signed)
Signed and put in box to go up front. Signed:  Crissie Sickles, MD           05/18/2020

## 2020-05-18 NOTE — Telephone Encounter (Signed)
Received fax from [place] for certification and POC, Placed on PCP desk to review and sign, if appropriate.

## 2020-05-20 ENCOUNTER — Inpatient Hospital Stay: Payer: Medicare Other | Admitting: Family Medicine

## 2020-05-20 ENCOUNTER — Telehealth: Payer: Self-pay

## 2020-05-20 NOTE — Telephone Encounter (Signed)
Sent as FYI  Received call from [place] w/ [facility] [place] to inform she is discharging patient due to refusal of PT. They have attempted several times to go to his home for services and he refused stating "he is fine and does not want or need help".

## 2020-05-20 NOTE — Telephone Encounter (Signed)
Noted  

## 2020-05-25 DIAGNOSIS — N183 Chronic kidney disease, stage 3 unspecified: Secondary | ICD-10-CM | POA: Diagnosis not present

## 2020-05-25 DIAGNOSIS — I480 Paroxysmal atrial fibrillation: Secondary | ICD-10-CM | POA: Diagnosis not present

## 2020-05-25 DIAGNOSIS — Z940 Kidney transplant status: Secondary | ICD-10-CM | POA: Diagnosis not present

## 2020-05-25 DIAGNOSIS — I129 Hypertensive chronic kidney disease with stage 1 through stage 4 chronic kidney disease, or unspecified chronic kidney disease: Secondary | ICD-10-CM | POA: Diagnosis not present

## 2020-05-25 DIAGNOSIS — D631 Anemia in chronic kidney disease: Secondary | ICD-10-CM | POA: Diagnosis not present

## 2020-05-26 ENCOUNTER — Other Ambulatory Visit: Payer: Self-pay

## 2020-05-26 ENCOUNTER — Encounter: Payer: Self-pay | Admitting: Family Medicine

## 2020-05-26 ENCOUNTER — Ambulatory Visit (INDEPENDENT_AMBULATORY_CARE_PROVIDER_SITE_OTHER): Payer: Medicare Other | Admitting: Family Medicine

## 2020-05-26 VITALS — BP 105/69 | HR 73 | Temp 97.9°F | Resp 16 | Ht 72.0 in | Wt 236.2 lb

## 2020-05-26 DIAGNOSIS — N3000 Acute cystitis without hematuria: Secondary | ICD-10-CM

## 2020-05-26 DIAGNOSIS — R748 Abnormal levels of other serum enzymes: Secondary | ICD-10-CM | POA: Diagnosis not present

## 2020-05-26 DIAGNOSIS — N183 Chronic kidney disease, stage 3 unspecified: Secondary | ICD-10-CM

## 2020-05-26 DIAGNOSIS — E7800 Pure hypercholesterolemia, unspecified: Secondary | ICD-10-CM | POA: Diagnosis not present

## 2020-05-26 DIAGNOSIS — A419 Sepsis, unspecified organism: Secondary | ICD-10-CM

## 2020-05-26 DIAGNOSIS — G929 Unspecified toxic encephalopathy: Secondary | ICD-10-CM

## 2020-05-26 DIAGNOSIS — F1010 Alcohol abuse, uncomplicated: Secondary | ICD-10-CM

## 2020-05-26 DIAGNOSIS — J9601 Acute respiratory failure with hypoxia: Secondary | ICD-10-CM

## 2020-05-26 DIAGNOSIS — G92 Toxic encephalopathy: Secondary | ICD-10-CM

## 2020-05-26 DIAGNOSIS — K769 Liver disease, unspecified: Secondary | ICD-10-CM

## 2020-05-26 DIAGNOSIS — I480 Paroxysmal atrial fibrillation: Secondary | ICD-10-CM

## 2020-05-26 LAB — COMPREHENSIVE METABOLIC PANEL
ALT: 11 U/L (ref 0–53)
AST: 13 U/L (ref 0–37)
Albumin: 3.7 g/dL (ref 3.5–5.2)
Alkaline Phosphatase: 103 U/L (ref 39–117)
BUN: 29 mg/dL — ABNORMAL HIGH (ref 6–23)
CO2: 26 mEq/L (ref 19–32)
Calcium: 9 mg/dL (ref 8.4–10.5)
Chloride: 103 mEq/L (ref 96–112)
Creatinine, Ser: 2.19 mg/dL — ABNORMAL HIGH (ref 0.40–1.50)
GFR: 29.91 mL/min — ABNORMAL LOW (ref >60.00)
Glucose, Bld: 160 mg/dL — ABNORMAL HIGH (ref 70–99)
Potassium: 5.1 mEq/L (ref 3.5–5.1)
Sodium: 134 mEq/L — ABNORMAL LOW (ref 135–145)
Total Bilirubin: 0.9 mg/dL (ref 0.2–1.2)
Total Protein: 6 g/dL (ref 6.0–8.3)

## 2020-05-26 LAB — LIPASE: Lipase: 82 U/L — ABNORMAL HIGH (ref 11.0–59.0)

## 2020-05-26 NOTE — Progress Notes (Addendum)
05/26/2020  CC:  Chief Complaint  Patient presents with  . Hospitalization Follow-up    Patient is a 69 y.o. Caucasian male who presents for  hospital follow up.  Services face-to-face visit. Dates hospitalized: 5/9-5/15, 2021 Days since d/c from hospital: 16 Patient was discharged from hospital to home. Reason for admission to hospital: UTI with sepsis, acute toxic encephalopathy, AKI on CRI 3, new onset a-fib with RVR.   I have reviewed patient's discharge summary plus pertinent specific notes, labs, and imaging from the hospitalization.   Hosp near [place] initially, no nephrologist there,pt was given morphine which he says he does not do well with, he then left there AMA and came to [place]. Some suspicion of w/drawal from alcohol.  Drinks 4 glasses of wine a day for 20 yrs.  [place] 4[place] since last drink. He has not returned to drinking yet at all.  Occ taking the xanax he was rx'd   Head feeling a little fuzzy and energy returning gradually still but otherwise feeling fine. Fasting glucoses 130s or so, up since all this illness.  Gluc on nephrologist's recent labs last week was 177.  Goes to Dr. Posey Pronto again in [place]. He was put on eliquis b/c of his fear of easy bleeding so he has not taken this med. He is skeptical that he has true a-fib.  Feels no palpitations or racing heart of dizziness.  HE has chosen to not take eliquis.  His d/c med list did not have amiodarone on it.  He does not know if he has any f/u with Dr. Einar Gip.  He has no appetite.  Drinking fluids well.  Hx of chronic loose bm's but this is much better since stopping wine intake.   Medication reconciliation was done today and patient is not taking meds as recommended by discharging hospitalist/specialist.    PMH:  Past Medical History:  Diagnosis Date  . Anxiety and depression    crowds, closed in spaces, used to sleep, prn  . Chronic renal insufficiency, stage III  (moderate)    polycystic kidney dz. (followed by Dr. Patel/Nephrology)--kidney transplant 2004.  Cr baseline as of 02/2020 is 1.6-1.9 (GFR 35-71m./min).  . DDD (degenerative disc disease), lumbar   . Deep vein thrombosis (DVT) of left upper extremity (HCC)    due to PICC line  . Diabetes mellitus with complication (HCC)    Mild non-proliferative diab retinopathy OS.  Hba1c at nephrologist's 02/18/18 was 5.8%. A1c at nephrol 02/2020 is 5.7%.  . Diverticulosis    + hx of 'itis  . Gastritis 01/2017   likely due to alcohol  . GERD (gastroesophageal reflux disease)   . Glaucoma    severe  . Gout   . H/O nonmelanoma skin cancer    SCC (chest, face, head)--s/p Mohs 2009.  BCC-s/p Mohs 2021  . H/O polycystic kidney disease   . History of depression   . History of kidney transplant    2004   . History of retinal detachment    Dr. Baird Cancer  . Hx of adenomatous colonic polyps 02/2012; 05/2014; 08/2017   2013 and 2015 tubular adenomas (Dr. Oletta Lamas). 08/2017 NO POLYPS->recall 5 yrs.  . Hyperlipemia, mixed    Lipids at goal 02/2020  . Hypertension   . Osteoarthritis    s/p R THR  . Sepsis ([place]) 04/2020   suspect urinary source; hosp complicated by PAF, acute delirium, acute resp fail requ intub, acute  on chron renal failur, ? etoh w/drawal.    PSH:  Past Surgical History:  Procedure Laterality Date  . COLONOSCOPY  06/10/14; 09/05/17   2015: Tubular adenoma x 4: recall 3 yrs (Dr. Alferd Apa [facility]). 08/2017 NO POLYPS. Recall 5 yrs.  . COLONOSCOPY  03/19/2012   tubular adenoma x 1; diverticulosis, int hem.Procedure: COLONOSCOPY;  Surgeon: Winfield Cunas., MD;  Location: [facility]COPY;  Service: Endoscopy;  Laterality: N/A;  . HOT HEMOSTASIS  03/19/2012   Procedure: HOT HEMOSTASIS (ARGON PLASMA COAGULATION/BICAP);  Surgeon: Winfield Cunas., MD;  Location: [facility] ENDOSCOPY;  Service: Endoscopy;  Laterality: N/A;  . KIDNEY TRANSPLANT  2004  . NOSE SURGERY  09/03/2019   skin cancer  . TOTAL HIP  ARTHROPLASTY  12/10/2012   Procedure: TOTAL HIP ARTHROPLASTY ANTERIOR APPROACH;  Surgeon: Mauri Pole, MD;  Location: [facility] ORS;  Service: Orthopedics;  Laterality: Right;    MEDS:  Outpatient Medications Prior to Visit  Medication Sig Dispense Refill  . ACCU-CHEK SMARTVIEW test strip USE TO TEST BLOOD SUGAR EVERY DAY (Patient taking differently: 1 each by Other route daily. ) 100 each 0  . allopurinol (ZYLOPRIM) 100 MG tablet Take 200 mg by mouth daily.     Marland Kitchen ALPRAZolam (XANAX) 0.25 MG tablet Take 1 tablet (0.25 mg total) by mouth daily as needed. (Patient taking differently: Take 0.25 mg by mouth daily as needed for anxiety or sleep. ) 30 tablet 5  . amLODipine (NORVASC) 5 MG tablet Take 5 mg by mouth daily.    Marland Kitchen FLUoxetine (PROZAC) 40 MG capsule Take 40 mg by mouth daily. Patient takes in am    . glipiZIDE (GLUCOTROL) 5 MG tablet Take 5 mg by mouth See admin instructions. Take 10mg  in the morning, and 5mg  in the evening.    Marland Kitchen MAGNESIUM-OXIDE 400 (241.3 Mg) MG tablet Take 400 mg by mouth daily.   6  . metoprolol tartrate (LOPRESSOR) 50 MG tablet Take 1 tablet (50 mg total) by mouth 2 (two) times daily. 60 tablet 6  . mycophenolate (CELLCEPT) 250 MG capsule Take 250 mg by mouth 2 (two) times daily.     . pantoprazole (PROTONIX) 20 MG tablet Take 20 mg by mouth every morning.     . pioglitazone (ACTOS) 45 MG tablet Take 1 tablet (45 mg total) by mouth daily. 90 tablet 0  . predniSONE (DELTASONE) 5 MG tablet Take 5 mg by mouth daily.    . rosuvastatin (CRESTOR) 20 MG tablet TAKE 1 TABLET BY MOUTH EVERY DAY (Patient taking differently: Take 20 mg by mouth daily. ) 90 tablet 3  . tacrolimus (PROGRAF) 0.5 MG capsule Take 1 capsule by mouth 2 (two) times daily.  6  . tacrolimus (PROGRAF) 1 MG capsule Take 1 mg by mouth 2 (two) times daily.     Marland Kitchen ZIOPTAN 0.0015 % SOLN Place 1 drop into both eyes at bedtime.  3  . apixaban (ELIQUIS) 5 MG TABS tablet Take 1 tablet (5 mg total) by mouth 2 (two) times  daily. (Patient not taking: Reported on 05/26/2020) 60 tablet 0   No facility-administered medications prior to visit.  EXAM; Vitals with BMI 05/26/2020 05/08/2020 05/08/2020  Height 6\' 0"  - -  Weight 236 lbs 3 oz - -  BMI [id number] - -  Systolic [id number]  Diastolic 69 69 68  Pulse 73 88 80   Gen: Alert, well appearing.  Patient is oriented to person, place, time, and situation. AFFECT: pleasant, lucid thought  and speech. No jaundice or icterus. [id number]: no injection, icteris, swelling, or exudate.  EOMI, PERRLA. Mouth: lips without lesion/swelling.  Oral mucosa pink and moist. Oropharynx without erythema, exudate, or swelling.  CV: RRR, no m/r/g.   LUNGS: CTA bilat, nonlabored resps, good aeration in all lung fields. ABD: soft, NT/ND EXT: no clubbing or cyanosis.  Trace bilat LL pitting edema.     Pertinent labs/imaging   Chemistry      Component Value Date/Time   NA 138 05/08/2020 0743   NA 138 02/27/2020 0000   K 5.5 (H) 05/08/2020 0743   CL 98 05/08/2020 0743   CO2 26 05/08/2020 0743   BUN 21 05/08/2020 0743   BUN 39 (A) 02/27/2020 0000   CREATININE 1.75 (H) 05/08/2020 0743   CREATININE 1.85 02/18/2018 0000   GLU 128 02/27/2020 0000      Component Value Date/Time   CALCIUM 8.1 (L) 05/08/2020 0743   ALKPHOS 69 05/05/2020 0513   AST 64 (H) 05/05/2020 0513   ALT 41 05/05/2020 0513   BILITOT 1.1 05/05/2020 0513     Lab Results  Component Value Date   WBC 8.0 05/08/2020   HGB 11.5 (L) 05/08/2020   HCT 36.3 (L) 05/08/2020   MCV 98.9 05/08/2020   PLT 184 05/08/2020   Lab Results  Component Value Date   HGBA1C 5.9 (H) 05/02/2020   Lab Results  Component Value Date   CHOL 157 02/27/2020   HDL 55 02/27/2020   LDLCALC 71 02/27/2020   LDLDIRECT 91.0 06/23/2019   TRIG 184 (A) 02/27/2020   CHOLHDL 3 06/23/2019   Lab Results  Component Value Date   LIPASE 82.0 (H) 05/26/2020    ASSESSMENT/PLAN:  Hosp f/u:  1) UTI with sepsis: UC neg, CT suggestive of  cystitis.  Received 7d ceftriax in hosp.  2) Acute hypox/hypercarb resp failure: suspect due to delirium, sedation, sepsis--resolved s/p brief intubation/vent.  3) Acute toxic encephalopathy: secondary to sepsis, ? Sundowning, ? Alc w/drawal. CT head neg acute.   Abuses alcohol some, lipase mildly elevated in hosp. He got some benzo+thiamine and folate inpt, d/c'd on xanax which he is taking sparingly. Resolved.   Repeat lipase and hepatic panel today.  4) AKI on CRI, hx of renal transplant: hydration->sCr returned to baseline of 1.7 prior to d/c.  Cellcept, prograf, and prednisone at home dose continued, recommended f/u with Dr. Posey Pronto. Lytes/cr today.  5) New onset a-fib with RVR: amio briefly inpt, none upon d/c.  Pt in sinus prior to d/c. Pt afraid of bleeding possibility on [place] so he's not taking it.  6) Liver lesions: CT abd w/out contrast showed two small low-attenuation liver lesions which cannot be characterized on unenhanced exam.  Consider MRI abd without and with contrast for further characterization. Will need to address with pt at future f/u.  Spent 45 min with pt today, with >50% of this time spent in counseling and care coordination regarding the above problems.  FOLLOW UP:  Keep appt set for 06/22/20 for 6 mo RCI  Signed:  Crissie Sickles, MD           05/26/2020

## 2020-05-27 ENCOUNTER — Encounter: Payer: Self-pay | Admitting: Family Medicine

## 2020-05-27 ENCOUNTER — Other Ambulatory Visit: Payer: Self-pay

## 2020-05-27 MED ORDER — PIOGLITAZONE HCL 45 MG PO TABS: 45.0000 mg | ORAL_TABLET | Freq: Every day | ORAL | 0 refills | Status: DC

## 2020-05-28 ENCOUNTER — Other Ambulatory Visit: Payer: Self-pay

## 2020-05-28 ENCOUNTER — Encounter: Payer: Self-pay | Admitting: Family Medicine

## 2020-05-28 DIAGNOSIS — R7989 Other specified abnormal findings of blood chemistry: Secondary | ICD-10-CM

## 2020-06-07 ENCOUNTER — Ambulatory Visit (INDEPENDENT_AMBULATORY_CARE_PROVIDER_SITE_OTHER): Payer: Medicare Other | Admitting: Family Medicine

## 2020-06-07 ENCOUNTER — Other Ambulatory Visit: Payer: Self-pay

## 2020-06-07 DIAGNOSIS — R7989 Other specified abnormal findings of blood chemistry: Secondary | ICD-10-CM | POA: Diagnosis not present

## 2020-06-07 LAB — BASIC METABOLIC PANEL
BUN: 30 mg/dL — ABNORMAL HIGH (ref 6–23)
CO2: 24 mEq/L (ref 19–32)
Calcium: 9 mg/dL (ref 8.4–10.5)
Chloride: 103 mEq/L (ref 96–112)
Creatinine, Ser: 1.88 mg/dL — ABNORMAL HIGH (ref 0.40–1.50)
GFR: 35.66 mL/min — ABNORMAL LOW (ref >60.00)
Glucose, Bld: 148 mg/dL — ABNORMAL HIGH (ref 70–99)
Potassium: 4.3 mEq/L (ref 3.5–5.1)
Sodium: 135 mEq/L (ref 135–145)

## 2020-06-08 ENCOUNTER — Encounter: Payer: Self-pay | Admitting: Family Medicine

## 2020-06-19 ENCOUNTER — Encounter: Payer: Self-pay | Admitting: Family Medicine

## 2020-06-19 NOTE — Addendum Note (Signed)
Addended by: [name] on: 06/19/2020 02:03 PM   Modules accepted: Level of Service

## 2020-06-22 ENCOUNTER — Encounter: Payer: Self-pay | Admitting: Cardiology

## 2020-06-22 ENCOUNTER — Other Ambulatory Visit: Payer: Self-pay

## 2020-06-22 ENCOUNTER — Ambulatory Visit: Payer: Medicare Other | Admitting: Cardiology

## 2020-06-22 VITALS — BP 126/81 | HR 74 | Ht 72.0 in | Wt 234.0 lb

## 2020-06-22 DIAGNOSIS — Z940 Kidney transplant status: Secondary | ICD-10-CM

## 2020-06-22 DIAGNOSIS — I480 Paroxysmal atrial fibrillation: Secondary | ICD-10-CM

## 2020-06-22 DIAGNOSIS — Z789 Other specified health status: Secondary | ICD-10-CM

## 2020-06-22 DIAGNOSIS — I351 Nonrheumatic aortic (valve) insufficiency: Secondary | ICD-10-CM

## 2020-06-22 DIAGNOSIS — Z87891 Personal history of nicotine dependence: Secondary | ICD-10-CM

## 2020-06-22 DIAGNOSIS — E119 Type 2 diabetes mellitus without complications: Secondary | ICD-10-CM

## 2020-06-22 DIAGNOSIS — Q613 Polycystic kidney, unspecified: Secondary | ICD-10-CM

## 2020-06-22 DIAGNOSIS — I350 Nonrheumatic aortic (valve) stenosis: Secondary | ICD-10-CM

## 2020-06-22 DIAGNOSIS — Z7901 Long term (current) use of anticoagulants: Secondary | ICD-10-CM

## 2020-06-22 DIAGNOSIS — F1090 Alcohol use, unspecified, uncomplicated: Secondary | ICD-10-CM

## 2020-06-22 MED ORDER — ROSUVASTATIN CALCIUM 20 MG PO TABS: 20.0000 mg | ORAL_TABLET | Freq: Every day | ORAL | 0 refills | Status: DC

## 2020-06-22 NOTE — Progress Notes (Addendum)
James Zimmerman Date of Birth: 29-Aug-1951 MRN: [id number] Primary Care Provider:McGowen, James Blackwater, MD Former Cardiology Providers: Dr. Adrian Zimmerman Primary Cardiologist: James Kras, DO, [facility] (established care 06/22/2020)  Date: 06/22/20  Chief Complaint  Patient presents with  . Atrial Fibrillation  . New Patient (Initial Visit)    HPI  James Zimmerman is a 69 y.o.  male who presents to the office with a chief complaint of " atrial fibrillation." Patient's past medical history and cardiovascular risk factors include: Hx of DVT after surgery, NIDDM, Hypertension, Hyperlipidemia, polycystic kidney disease status post renal transplant, paroxysmal atrial fibrillation, advance age, obesity due to excess calories.   Patient was originally seen by my partner Dr. Adrian Zimmerman while he was hospitalized in May 2021 for new onset of atrial fibrillation with rapid ventricular rate.  During the hospitalization patient was treated with short course of amiodarone due to drug interactions this was discontinued.  He was started on oral anticoagulation and was asked to continue this as outpatient given his CHA2DS2-VASc SCORE is 4.  Since hospitalization patient has been doing well and is now referred to the office at the request of his primary care provider and nephrology for further management of atrial fibrillation.  Patient denies any active chest pain at rest or with effort related activities.  Patient is euvolemic and no prior history of congestive heart failure.  Patient still continues to have symptoms of feeling tired and fatigue.  Since discharge patient has not request Eliquis despite knowing his CHA2DS2-VASc SCORE.  Patient is concerned about the bleeding risk that he may be predisposed to as result of being on blood thinners.  He states that he already bruises very easily.  He had one friend was on oral anticoagulation and when he went to go to change a light bulb he fell and hit his head and  had a bad outcome.   Denies prior history of coronary artery disease, myocardial infarction, congestive heart failure,  pulmonary embolism, stroke, transient ischemic attack.  FUNCTIONAL STATUS: No structured exercise program or daily routine.    ALLERGIES: Allergies  Allergen Reactions  . Lipitor [Atorvastatin] Other (See Comments)    Headaches  . Penicillins Rash  . Zocor [Simvastatin] Other (See Comments)    Myalgia     MEDICATION LIST PRIOR TO VISIT: Current Outpatient Medications on File Prior to Visit  Medication Sig Dispense Refill  . allopurinol (ZYLOPRIM) 100 MG tablet Take 200 mg by mouth daily.     Marland Kitchen ALPRAZolam (XANAX) 0.25 MG tablet Take 1 tablet (0.25 mg total) by mouth daily as needed. (Patient taking differently: Take 0.25 mg by mouth daily as needed for anxiety or sleep. ) 30 tablet 5  . amLODipine (NORVASC) 5 MG tablet Take 5 mg by mouth daily.    Marland Kitchen FLUoxetine (PROZAC) 40 MG capsule Take 40 mg by mouth daily. Patient takes in am    . glipiZIDE (GLUCOTROL) 5 MG tablet Take 5 mg by mouth See admin instructions. Take 10mg  in the morning, and 5mg  in the evening.    Marland Kitchen MAGNESIUM-OXIDE 400 (241.3 Mg) MG tablet Take 400 mg by mouth daily.   6  . metoprolol tartrate (LOPRESSOR) 50 MG tablet Take 1 tablet (50 mg total) by mouth 2 (two) times daily. 60 tablet 6  . mycophenolate (CELLCEPT) 250 MG capsule Take 250 mg by mouth 2 (two) times daily.     . pantoprazole (PROTONIX) 20 MG tablet Take 20 mg by mouth every morning.     Marland Kitchen  pioglitazone (ACTOS) 45 MG tablet Take 1 tablet (45 mg total) by mouth daily. 90 tablet 0  . predniSONE (DELTASONE) 5 MG tablet Take 5 mg by mouth daily.    . rosuvastatin (CRESTOR) 20 MG tablet TAKE 1 TABLET BY MOUTH EVERY DAY (Patient taking differently: Take 20 mg by mouth daily. ) 90 tablet 3  . tacrolimus (PROGRAF) 0.5 MG capsule Take 1 capsule by mouth 2 (two) times daily.  6  . tacrolimus (PROGRAF) 1 MG capsule Take 1 mg by mouth 2 (two) times  daily.     Marland Kitchen ZIOPTAN 0.0015 % SOLN Place 1 drop into both eyes at bedtime.  3  . ACCU-CHEK SMARTVIEW test strip USE TO TEST BLOOD SUGAR EVERY DAY (Patient taking differently: 1 each by Other route daily. ) 100 each 0  . apixaban (ELIQUIS) 5 MG TABS tablet Take 1 tablet (5 mg total) by mouth 2 (two) times daily. (Patient not taking: Reported on 05/26/2020) 60 tablet 0   No current facility-administered medications on file prior to visit.    PAST MEDICAL HISTORY: Past Medical History:  Diagnosis Date  . Anxiety and depression    crowds, closed in spaces, used to sleep, prn  . Chronic renal insufficiency, stage III (moderate)    polycystic kidney dz. (followed by Dr. Patel/Nephrology)--kidney transplant 2004.  Cr baseline as of 02/2020 is 1.6-1.9 (GFR 35-76m./min).  . DDD (degenerative disc disease), lumbar   . Deep vein thrombosis (DVT) of left upper extremity (HCC)    due to PICC line  . Diabetes mellitus with complication (HCC)    Mild non-proliferative diab retinopathy OS.  Hba1c at nephrologist's 02/18/18 was 5.8%. A1c at nephrol 02/2020 is 5.7%.  . Diverticulosis    + hx of 'itis  . Gastritis 01/2017   likely due to alcohol  . GERD (gastroesophageal reflux disease)   . Glaucoma    severe  . Gout   . H/O nonmelanoma skin cancer    SCC (chest, face, head)--s/p Mohs 2009.  BCC-s/p Mohs 2021  . H/O polycystic kidney disease   . History of depression   . History of kidney transplant    2004   . History of retinal detachment    Dr. Baird [facility]  . Hx of adenomatous colonic polyps 02/2012; 05/2014; 08/2017   2013 and 2015 tubular adenomas (Dr. Oletta Lamas). 08/2017 NO POLYPS->recall 5 yrs.  . Hyperlipemia, mixed    Lipids at goal 02/2020  . Hypertension   . Osteoarthritis    s/p R THR  . Sepsis ([place]) 04/2020   suspect urinary source; hosp complicated by PAF, acute delirium, acute resp fail requ intub, acute on chron renal failur, ? etoh w/drawal.    PAST SURGICAL HISTORY: Past Surgical  History:  Procedure Laterality Date  . COLONOSCOPY  06/10/14; 09/05/17   2015: Tubular adenoma x 4: recall 3 yrs (Dr. Alferd [facility]). 08/2017 NO POLYPS. Recall 5 yrs.  . COLONOSCOPY  03/19/2012   tubular adenoma x 1; diverticulosis, int hem.Procedure: COLONOSCOPY;  Surgeon: Winfield Cunas., MD;  Location[facility] ENDOSCOPY;  Service: Endoscopy;  Laterality: N/A;  . HOT HEMOSTASIS  03/19/2012   Procedure: HOT HEMOSTASIS (ARGON PLASMA COAGULATION/BICAP);  Surgeon: Winfield Cunas., MD;  Location: [facility] ENDOSCOPY;  Service: Endoscopy;  Laterality: N/A;  . KIDNEY TRANSPLANT  2004  . NOSE SURGERY  09/03/2019   skin cancer  . TOTAL HIP ARTHROPLASTY  12/10/2012   Procedure: TOTAL HIP ARTHROPLASTY ANTERIOR APPROACH;  Surgeon: Mauri Pole, MD;  Location: [facility] ORS;  Service: Orthopedics;  Laterality: Right;  . TRANSTHORACIC ECHOCARDIOGRAM  05/02/2020   EF 60-65%, nl LV fxn, no DD, mild LA dilat, Mild to mod AR, Mild AS.    FAMILY HISTORY: The patient's family history includes Diabetes in his maternal grandmother and mother; Heart attack in his mother; Heart disease in his brother; Hyperlipidemia in his father; Hypertension in his father; Kidney disease in his brother, father, and paternal grandfather; Polycystic kidney disease in an other family member.   SOCIAL HISTORY:  The patient  reports that he quit smoking about 36 years ago. His smoking use included cigarettes. He has a 60.00 pack-year smoking history. He has never used smokeless tobacco. He reports current alcohol use of about 28.0 standard drinks of alcohol per week. He reports that he does not use drugs.  Review of Systems  Constitutional: Negative for chills and fever.  HENT: Negative for hoarse voice and nosebleeds.   Eyes: Negative for discharge, double vision and pain.  Cardiovascular: Negative for chest pain, claudication, dyspnea on exertion, leg swelling, near-syncope, orthopnea, palpitations, paroxysmal nocturnal dyspnea and  syncope.  Respiratory: Negative for hemoptysis and shortness of breath.   Musculoskeletal: Negative for muscle cramps and myalgias.  Gastrointestinal: Negative for abdominal pain, constipation, diarrhea, hematemesis, hematochezia, melena, nausea and vomiting.  Neurological: Negative for dizziness and light-headedness.    PHYSICAL EXAM: Vitals with BMI 06/22/2020 05/26/2020 05/08/2020  Height 6\' 0"  6\' 0"  -  Weight 234 lbs 236 lbs 3 oz -  BMI 98.92 11.94 -  Systolic 174 081 448  Diastolic 81 69 69  Pulse 74 73 88    CONSTITUTIONAL: Well-developed and well-nourished. No acute distress.  SKIN: Skin is warm and dry. No rash noted. No cyanosis. No pallor. No jaundice HEAD: Normocephalic and atraumatic.  EYES: No scleral icterus MOUTH/THROAT: Moist oral membranes.  NECK: No JVD present. No thyromegaly noted. No carotid bruits  LYMPHATIC: No visible cervical adenopathy.  CHEST Normal respiratory effort. No intercostal retractions  LUNGS: Clear to auscultation bilaterally.  No stridor. No wheezes. No rales.  CARDIOVASCULAR: Regular, positive J8-H6, soft systolic ejection murmur heard at the center intercostal space, no gallops or rubs. ABDOMINAL: Obese, soft, nontender, nondistended, positive bowel sounds all 4 quadrants. No apparent ascites.  EXTREMITIES: No peripheral edema.  2+ bilateral posterior tibial and dorsalis pedis pulses. HEMATOLOGIC: No significant bruising NEUROLOGIC: Oriented to person, place, and time. Nonfocal. Normal muscle tone.  PSYCHIATRIC: Normal mood and affect. Normal behavior. Cooperative  CARDIAC DATABASE: EKG: 06/22/2020: Normal sinus rhythm, 71 bpm, left axis deviation, poor R wave progression, without underlying injury pattern.  Echocardiogram: 5/92021: LVEF 60-65%, no regional wall motion abnormalities, mild, normal diastolic parameters, mildly dilated left atrium, trivial MR, no mitral stenosis, mild aortic stenosis, mild to moderate AR.  Stress Testing:    None in last 5 years.   Heart Catheterization: None  LABORATORY DATA: CBC Latest Ref Rng & Units 05/08/2020 05/07/2020 05/05/2020  WBC 4.0 - 10.5 K/uL 8.0 8.9 -  Hemoglobin 13.0 - 17.0 g/dL 11.5(L) 11.5(L) 9.9(L)  Hematocrit 39 - 52 % 36.3(L) 35.4(L) 29.0(L)  Platelets 150 - 400 K/uL 184 175 -    CMP Latest Ref Rng & Units 06/07/2020 05/26/2020 05/18/2020  Glucose 70 - 99 mg/dL 148(H) 160(H) -  BUN 6 - 23 mg/dL 30(H) 29(H) 24(A)  Creatinine 0.40 - 1.50 mg/dL 1.88(H) 2.19(H) 1.9(A)  Sodium 135 - 145 mEq/L 135 134(L) 137  Potassium 3.5 - 5.1 mEq/L 4.3 5.1 4.3  Chloride  96 - 112 mEq/L 103 103 101  CO2 19 - 32 mEq/L 24 26 20   Calcium 8.4 - 10.5 mg/dL 9.0 9.0 9.0  Total Protein 6.0 - 8.3 g/dL - 6.0 -  Total Bilirubin 0.2 - 1.2 mg/dL - 0.9 -  Alkaline Phos 39 - 117 U/L - 103 -  AST 0 - 37 U/L - 13 -  ALT 0 - 53 U/L - 11 -    Lipid Panel     Component Value Date/Time   CHOL 157 02/27/2020 0000   TRIG 184 (A) 02/27/2020 0000   HDL 55 02/27/2020 0000   CHOLHDL 3 06/23/2019 0910   VLDL 49.2 (H) 06/23/2019 0910   LDLCALC 71 02/27/2020 0000   LDLDIRECT 91.0 06/23/2019 0910    Lab Results  Component Value Date   HGBA1C 5.9 (H) 05/02/2020   HGBA1C 5.7 02/27/2020   HGBA1C 6.1 06/23/2019   No components found for: NTPROBNP Lab Results  Component Value Date   TSH 2.579 05/05/2020   TSH 2.36 05/03/2016    Cardiac Panel (last 3 results) No results for input(s): CKTOTAL, CKMB, TROPONINIHS, RELINDX in the last 72 hours.  IMPRESSION:    ICD-10-CM   1. Paroxysmal atrial fibrillation (HCC)  I48.0 EKG 12-Lead    PCV ECHOCARDIOGRAM COMPLETE  2. Long term (current) use of anticoagulants  Z79.01   3. Polycystic kidney disease  Q61.3   4. Renal transplant recipient,2004  Z94.0   5. Former smoker  Z87.891   73. Alcohol use  Z72.89   7. Type 2 diabetes mellitus without complication, without long-term current use of insulin (HCC)  E11.9   8. Mild aortic stenosis  I35.0 PCV  ECHOCARDIOGRAM COMPLETE  9. Nonrheumatic aortic valve insufficiency  I35.1 PCV ECHOCARDIOGRAM COMPLETE     RECOMMENDATIONS: James Zimmerman is a 69 y.o. male whose past medical history and cardiovascular risk factors include: Hx of DVT after surgery, NIDDM, Hypertension, Hyperlipidemia, polycystic kidney disease status post renal transplant, paroxysmal atrial fibrillation, advance age, obesity due to excess calories.   Atrial fibrillation, paroxysmal: . Rate control: Lopressor . Rhythm control: N/A . Thromboembolic prophylaxis: Patient chooses not to be on oral anticoagulation, see below . CHA2DS2-VASc SCORE is 4 which correlates to 4 % risk of stroke per year (A, HTN, DM Vasc Dz - Ao athero by CT).    . Left atrium mildly dilated as per echocardiogram report. . EKG at today's office visit notes normal sinus rhythm. . Diagnosed in May 2021 during his hospitalization at [facility].  Long-term oral anticoagulation: . Indication: Paroxysmal atrial fibrillation . Currently not on oral anticoagulation.  After discussing his CHA2DS2-VASc score and guideline recommendations.  Patient chooses not to be on oral anticoagulation as he bruises very easily and had one prior to final and hit his head and had poor outcomes.  We also discussed alternatives such as watchman device, left atrial appendage closure device.  However, patient was not interested in being considered for this at the given time.  To discuss this further with his primary care provider and wife and will reevaluate at at the next office visit or sooner if he chooses. Estimated Creatinine Clearance: 46.7 mL/min (A) (by C-G formula based on SCr of 1.88 mg/dL (H)). Serum creatinine: 1.88 mg/dL (H) 06/07/20 5465 Estimated creatinine clearance: 46.7 mL/min (A)  Mixed aortic disease: Most recent echocardiogram from May 2021 patient was noted to have mild aortic valve stenosis with mild to moderate aortic regurgitation.  Currently patient  denies any  3 cardinal symptoms of congestive heart failure, angina, or syncope. We will recommend a 56-month follow-up echocardiogram to evaluate the progression of aortic stenosis as this was newly diagnosed.  Given his cardiovascular risk factors including DM, newly dx paroxysmal atrial fibrillation, and continued feeling of tried and fatigue recommended stress testing. Patient refuses to undergo either exercise or pharmacological stress test at this time. Will readdress at the next office visit.   Non-insulin-dependent diabetes mellitus type 2: Currently managed by primary team.  History of polycystic kidney disease status post renal transplantation in 2004: Currently being managed by his nephrologist that he sees on a 71-month basis.  I will see the patient back in the office in 7 months after the echo.  FINAL MEDICATION LIST END OF ENCOUNTER: No orders of the defined types were placed in this encounter.   There are no discontinued medications.   Current Outpatient Medications:  .  allopurinol (ZYLOPRIM) 100 MG tablet, Take 200 mg by mouth daily. , Disp: , Rfl:  .  ALPRAZolam (XANAX) 0.25 MG tablet, Take 1 tablet (0.25 mg total) by mouth daily as needed. (Patient taking differently: Take 0.25 mg by mouth daily as needed for anxiety or sleep. ), Disp: 30 tablet, Rfl: 5 .  amLODipine (NORVASC) 5 MG tablet, Take 5 mg by mouth daily., Disp: , Rfl:  .  FLUoxetine (PROZAC) 40 MG capsule, Take 40 mg by mouth daily. Patient takes in am, Disp: , Rfl:  .  glipiZIDE (GLUCOTROL) 5 MG tablet, Take 5 mg by mouth See admin instructions. Take 10mg  in the morning, and 5mg  in the evening., Disp: , Rfl:  .  MAGNESIUM-OXIDE 400 (241.3 Mg) MG tablet, Take 400 mg by mouth daily. , Disp: , Rfl: 6 .  metoprolol tartrate (LOPRESSOR) 50 MG tablet, Take 1 tablet (50 mg total) by mouth 2 (two) times daily., Disp: 60 tablet, Rfl: 6 .  mycophenolate (CELLCEPT) 250 MG capsule, Take 250 mg by mouth 2 (two) times daily. ,  Disp: , Rfl:  .  pantoprazole (PROTONIX) 20 MG tablet, Take 20 mg by mouth every morning. , Disp: , Rfl:  .  pioglitazone (ACTOS) 45 MG tablet, Take 1 tablet (45 mg total) by mouth daily., Disp: 90 tablet, Rfl: 0 .  predniSONE (DELTASONE) 5 MG tablet, Take 5 mg by mouth daily., Disp: , Rfl:  .  rosuvastatin (CRESTOR) 20 MG tablet, TAKE 1 TABLET BY MOUTH EVERY DAY (Patient taking differently: Take 20 mg by mouth daily. ), Disp: 90 tablet, Rfl: 3 .  tacrolimus (PROGRAF) 0.5 MG capsule, Take 1 capsule by mouth 2 (two) times daily., Disp: , Rfl: 6 .  tacrolimus (PROGRAF) 1 MG capsule, Take 1 mg by mouth 2 (two) times daily. , Disp: , Rfl:  .  ZIOPTAN 0.0015 % SOLN, Place 1 drop into both eyes at bedtime., Disp: , Rfl: 3 .  ACCU-CHEK SMARTVIEW test strip, USE TO TEST BLOOD SUGAR EVERY DAY (Patient taking differently: 1 each by Other route daily. ), Disp: 100 each, Rfl: 0 .  apixaban (ELIQUIS) 5 MG TABS tablet, Take 1 tablet (5 mg total) by mouth 2 (two) times daily. (Patient not taking: Reported on 05/26/2020), Disp: 60 tablet, Rfl: 0  Orders Placed This Encounter  Procedures  . EKG 12-Lead  . PCV ECHOCARDIOGRAM COMPLETE   --Continue cardiac medications as reconciled in final medication list. --Return in about 7 months (around 01/10/2021) for afib follow up., review test results.. Or sooner if needed. --Continue follow-up with your primary  care physician regarding the management of your other chronic comorbid conditions.  Patient's questions and concerns were addressed to his satisfaction. He voices understanding of the instructions provided during this encounter.   This note was created using a voice recognition software as a result there may be grammatical errors inadvertently enclosed that do not reflect the nature of this encounter. Every attempt is made to correct such errors.  James Zimmerman, [place], [facility]  Pager: [phone] Office: [phone]

## 2020-06-25 ENCOUNTER — Other Ambulatory Visit: Payer: Self-pay

## 2020-06-25 MED ORDER — ACCU-CHEK SMARTVIEW VI STRP: ORAL_STRIP | 3 refills | Status: DC

## 2020-07-14 DIAGNOSIS — L570 Actinic keratosis: Secondary | ICD-10-CM | POA: Diagnosis not present

## 2020-07-14 DIAGNOSIS — C4401 Basal cell carcinoma of skin of lip: Secondary | ICD-10-CM | POA: Diagnosis not present

## 2020-07-14 DIAGNOSIS — D0461 Carcinoma in situ of skin of right upper limb, including shoulder: Secondary | ICD-10-CM | POA: Diagnosis not present

## 2020-07-14 DIAGNOSIS — L821 Other seborrheic keratosis: Secondary | ICD-10-CM | POA: Diagnosis not present

## 2020-08-02 ENCOUNTER — Encounter: Payer: Self-pay | Admitting: Family Medicine

## 2020-08-04 DIAGNOSIS — Z20822 Contact with and (suspected) exposure to covid-19: Secondary | ICD-10-CM | POA: Diagnosis not present

## 2020-08-04 DIAGNOSIS — Z03818 Encounter for observation for suspected exposure to other biological agents ruled out: Secondary | ICD-10-CM | POA: Diagnosis not present

## 2020-08-21 ENCOUNTER — Other Ambulatory Visit: Payer: Self-pay | Admitting: Family Medicine

## 2020-08-23 DIAGNOSIS — E139 Other specified diabetes mellitus without complications: Secondary | ICD-10-CM | POA: Diagnosis not present

## 2020-08-23 DIAGNOSIS — Z940 Kidney transplant status: Secondary | ICD-10-CM | POA: Diagnosis not present

## 2020-08-23 DIAGNOSIS — M109 Gout, unspecified: Secondary | ICD-10-CM | POA: Diagnosis not present

## 2020-08-23 LAB — HEPATIC FUNCTION PANEL
ALT: 9 — AB (ref 10–40)
AST: 14 (ref 14–40)
Alkaline Phosphatase: 132 — AB (ref 25–125)
Bilirubin, Total: 0.6

## 2020-08-23 LAB — BASIC METABOLIC PANEL
BUN: 45 — AB (ref 4–21)
CO2: 20 (ref 13–22)
Chloride: 102 (ref 99–108)
Creatinine: 1.9 — AB (ref 0.6–1.3)
Glucose: 138
Potassium: 4.6 (ref 3.4–5.3)
Sodium: 138 (ref 137–147)

## 2020-08-23 LAB — COMPREHENSIVE METABOLIC PANEL
Albumin: 4.3 (ref 3.5–5.0)
Calcium: 8.9 (ref 8.7–10.7)
GFR calc non Af Amer: 36

## 2020-08-23 LAB — CBC AND DIFFERENTIAL
HCT: 40 — AB (ref 41–53)
Hemoglobin: 13.2 — AB (ref 13.5–17.5)
Platelets: 135 — AB (ref 150–399)
WBC: 7.2

## 2020-08-23 LAB — LIPID PANEL
HDL: 60 (ref 35–70)
LDL Cholesterol: 71
Triglycerides: 162 — AB (ref 40–160)

## 2020-08-23 LAB — HEMOGLOBIN A1C: Hemoglobin A1C: 6.2

## 2020-08-24 DIAGNOSIS — N183 Chronic kidney disease, stage 3 unspecified: Secondary | ICD-10-CM | POA: Diagnosis not present

## 2020-08-31 DIAGNOSIS — Z940 Kidney transplant status: Secondary | ICD-10-CM | POA: Diagnosis not present

## 2020-08-31 DIAGNOSIS — N183 Chronic kidney disease, stage 3 unspecified: Secondary | ICD-10-CM | POA: Diagnosis not present

## 2020-08-31 DIAGNOSIS — N2581 Secondary hyperparathyroidism of renal origin: Secondary | ICD-10-CM | POA: Diagnosis not present

## 2020-08-31 DIAGNOSIS — I129 Hypertensive chronic kidney disease with stage 1 through stage 4 chronic kidney disease, or unspecified chronic kidney disease: Secondary | ICD-10-CM | POA: Diagnosis not present

## 2020-08-31 DIAGNOSIS — D631 Anemia in chronic kidney disease: Secondary | ICD-10-CM | POA: Diagnosis not present

## 2020-09-05 IMAGING — CT CT HEAD W/O CM
3 series · 15 of 47 positions shown, 18 images · non-contrast
Comparison: MR brain dated March 09, 2018.

CLINICAL DATA: Altered mental status. Admitted for UTI. Cardiac
breath this morning.

EXAM:
CT HEAD WITHOUT CONTRAST
TECHNIQUE: Contiguous axial images were obtained from the base of the skull
through the vertex without intravenous contrast.

[Series 3: head 5.0 h30s · axial · 0.50mm/px · z∈[-123,+17]mm · 9 of 34 slices shown, 12 images]
[im 3/34  brain]
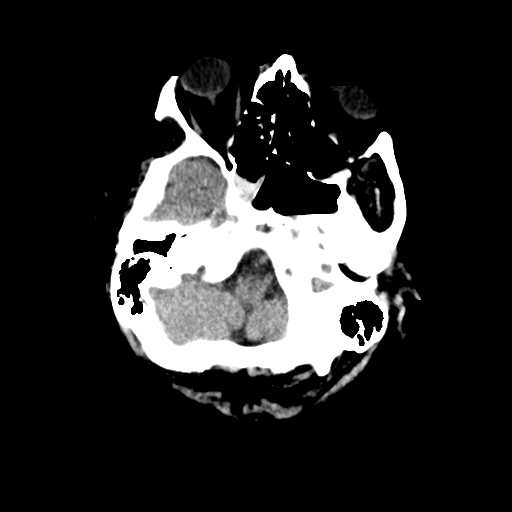
[im 3/34  bone]
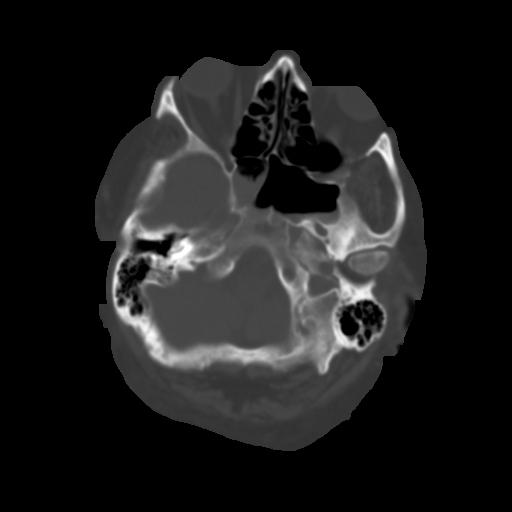
[im 6/34  brain]
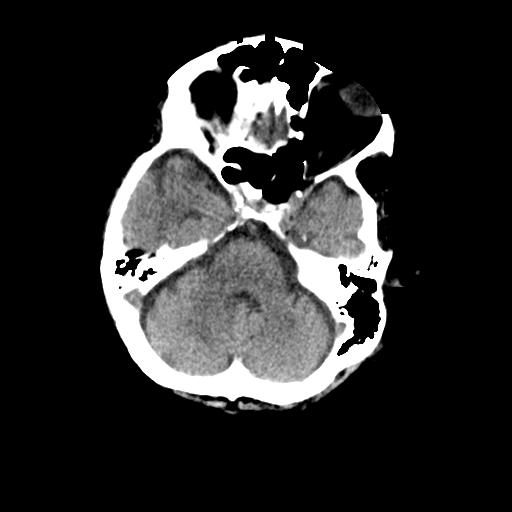
[im 10/34  brain]
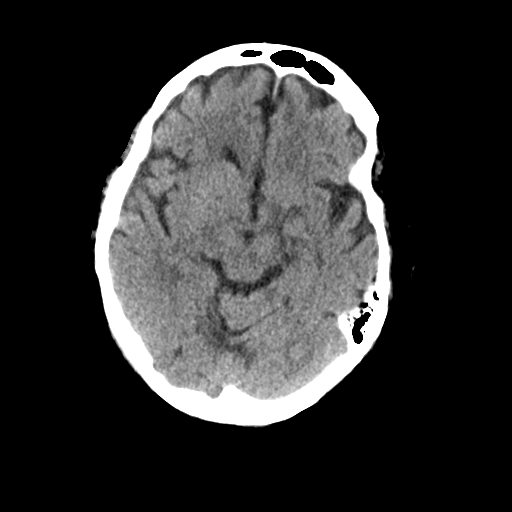
[im 13/34  brain]
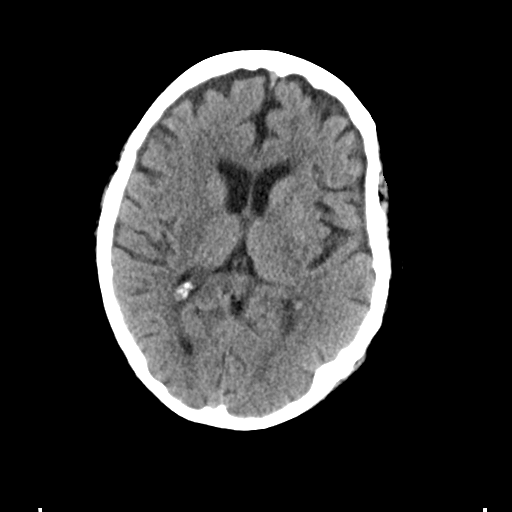
[im 18/34  brain]
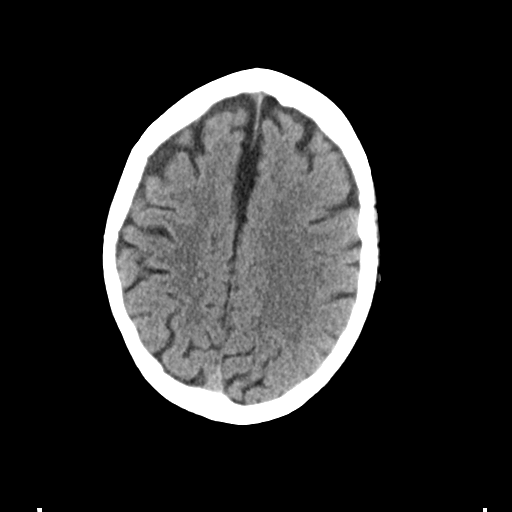
[im 18/34  bone]
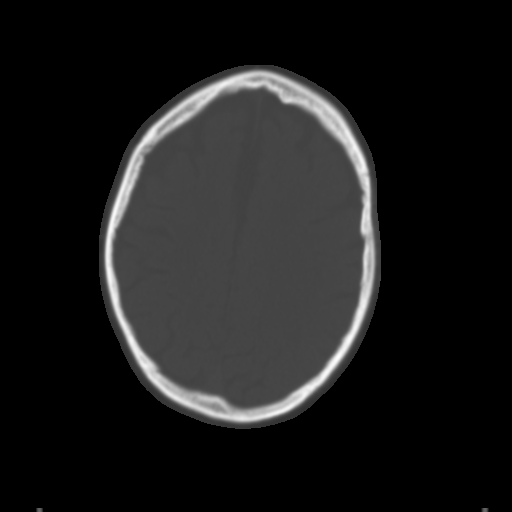
[im 21/34  brain]
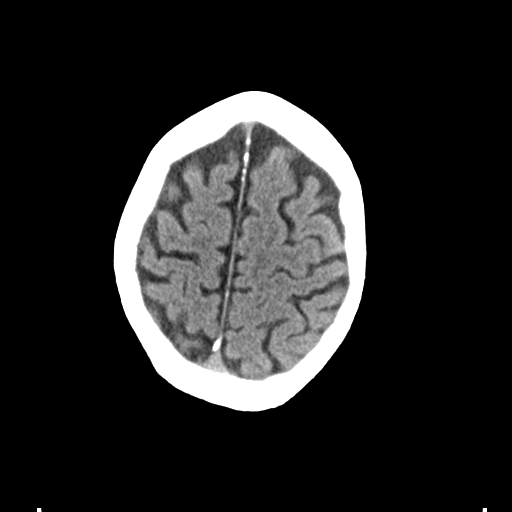
[im 24/34  brain]
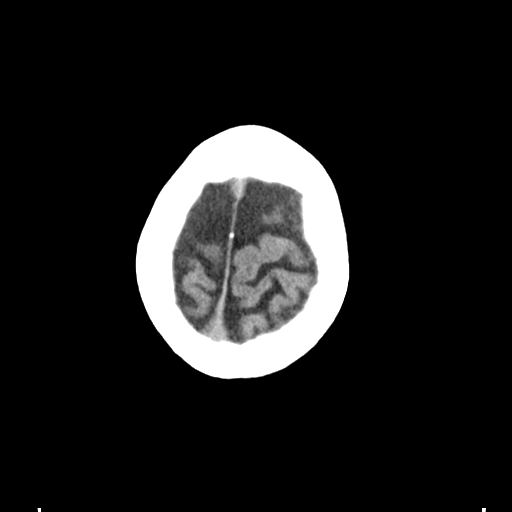
[im 28/34  brain]
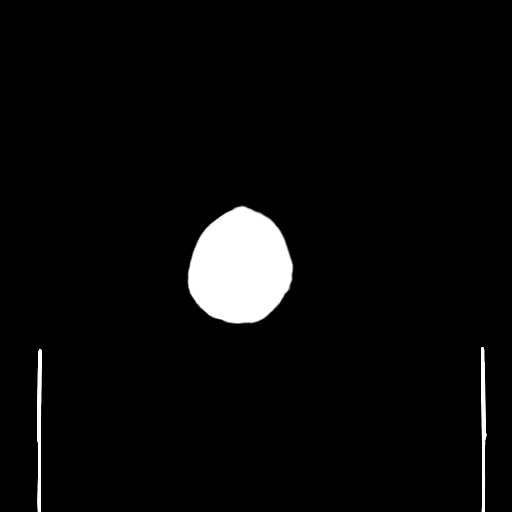
[im 31/34  brain]
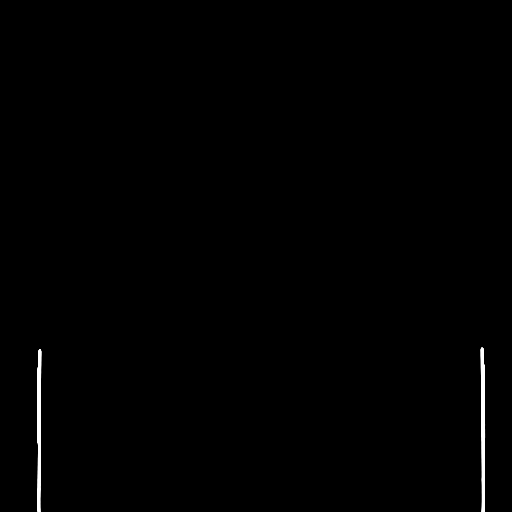
[im 31/34  bone]
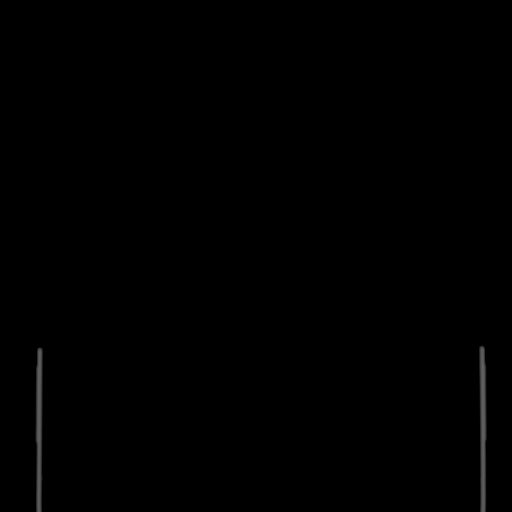

[Series 5: head 3.0 mpr cor · coronal · 0.33mm/px · 3 of 69 slices shown]
[im 23/69  brain]
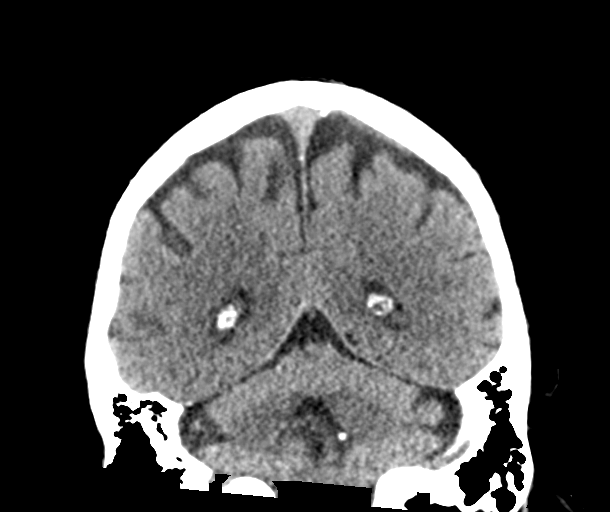
[im 31/69  brain]
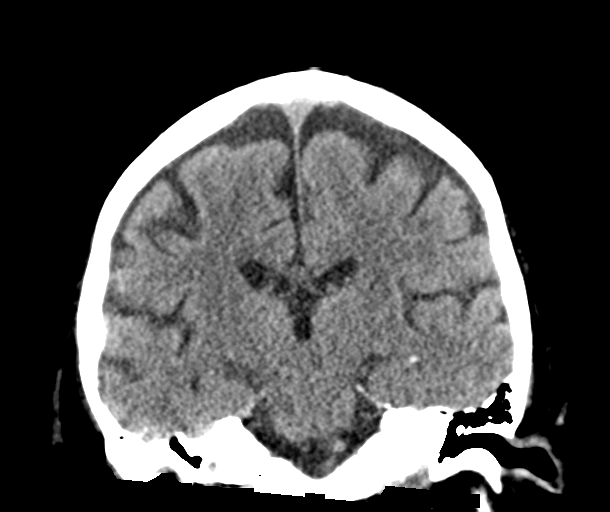
[im 38/69  brain]
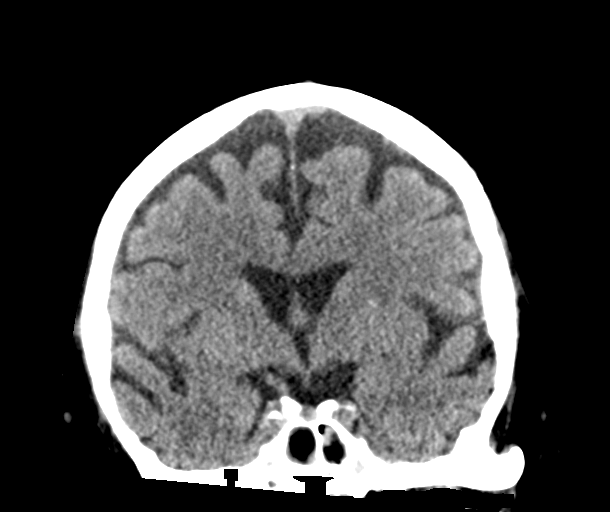

[Series 6: head 3.0 mpr sag · sagittal · 0.36mm/px · 3 of 67 slices shown]
[im 24/67  brain]
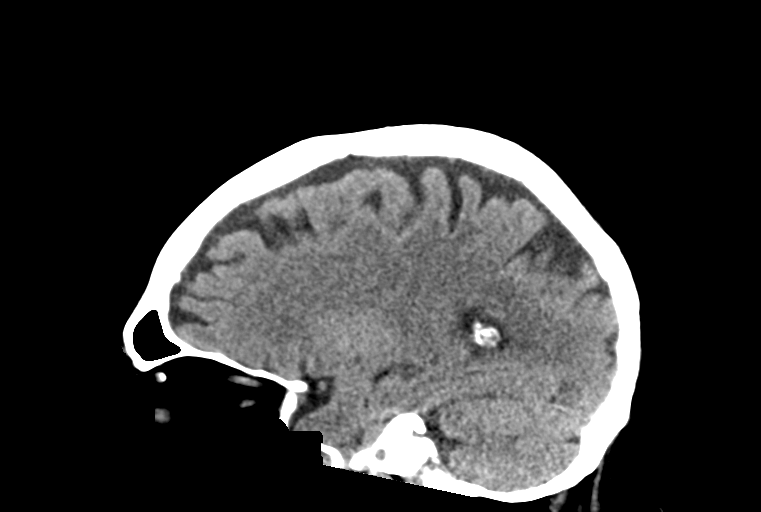
[im 34/67  brain]
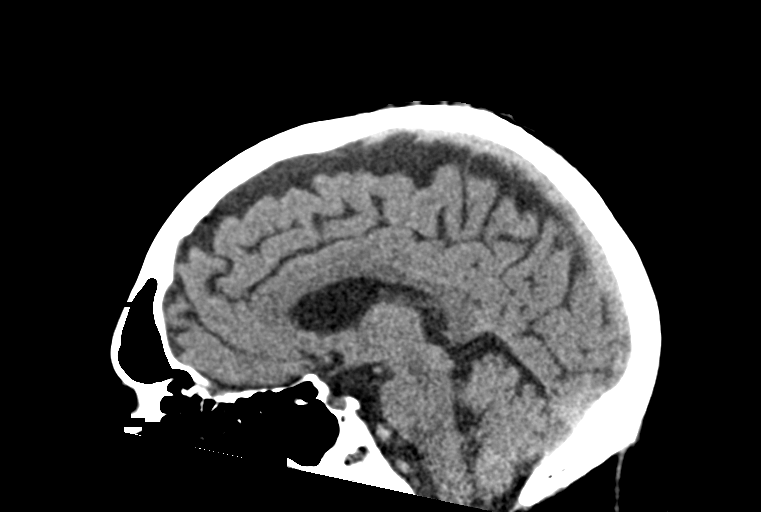
[im 44/67  brain]
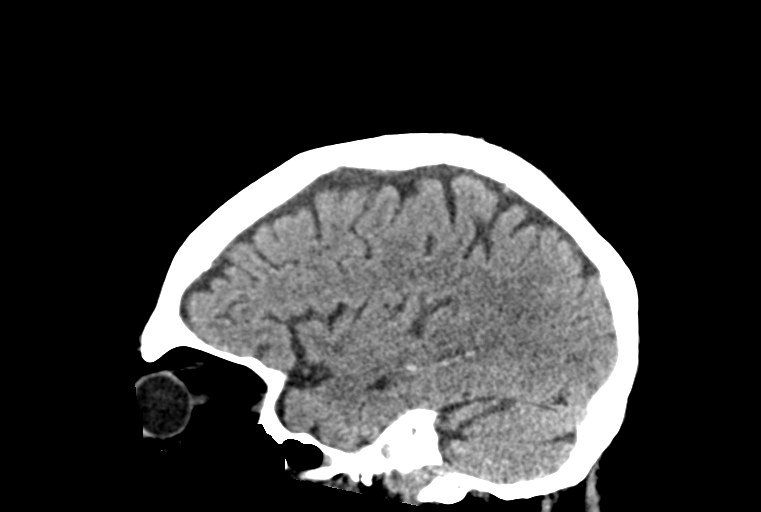

[15 of 47 positions shown; findings below may reference images not displayed]

FINDINGS: Brain: No evidence of acute infarction, hemorrhage, hydrocephalus,
extra-axial collection or mass lesion/mass effect. Mild-to-moderate
generalized cerebral atrophy, slightly advanced for age. Scattered
mild periventricular and subcortical white matter hypodensities are
nonspecific, but favored to reflect chronic microvascular ischemic
changes.

Vascular: Calcified atherosclerosis at the skullbase. No hyperdense
vessel.

Skull: Normal. Negative for fracture or focal lesion.

Sinuses/Orbits: Air-fluid levels with right posterior ethmoid air
cell and both sphenoid sinuses. The orbits are unremarkable.

Other: None.
IMPRESSION: 1.  No acute intracranial abnormality.

## 2020-09-05 IMAGING — DX DG CHEST 1V PORT
1 series · 1 of 1 positions shown · non-contrast
Comparison: Two days ago

CLINICAL DATA: Intubation

EXAM:
PORTABLE CHEST 1 VIEW

[chest]
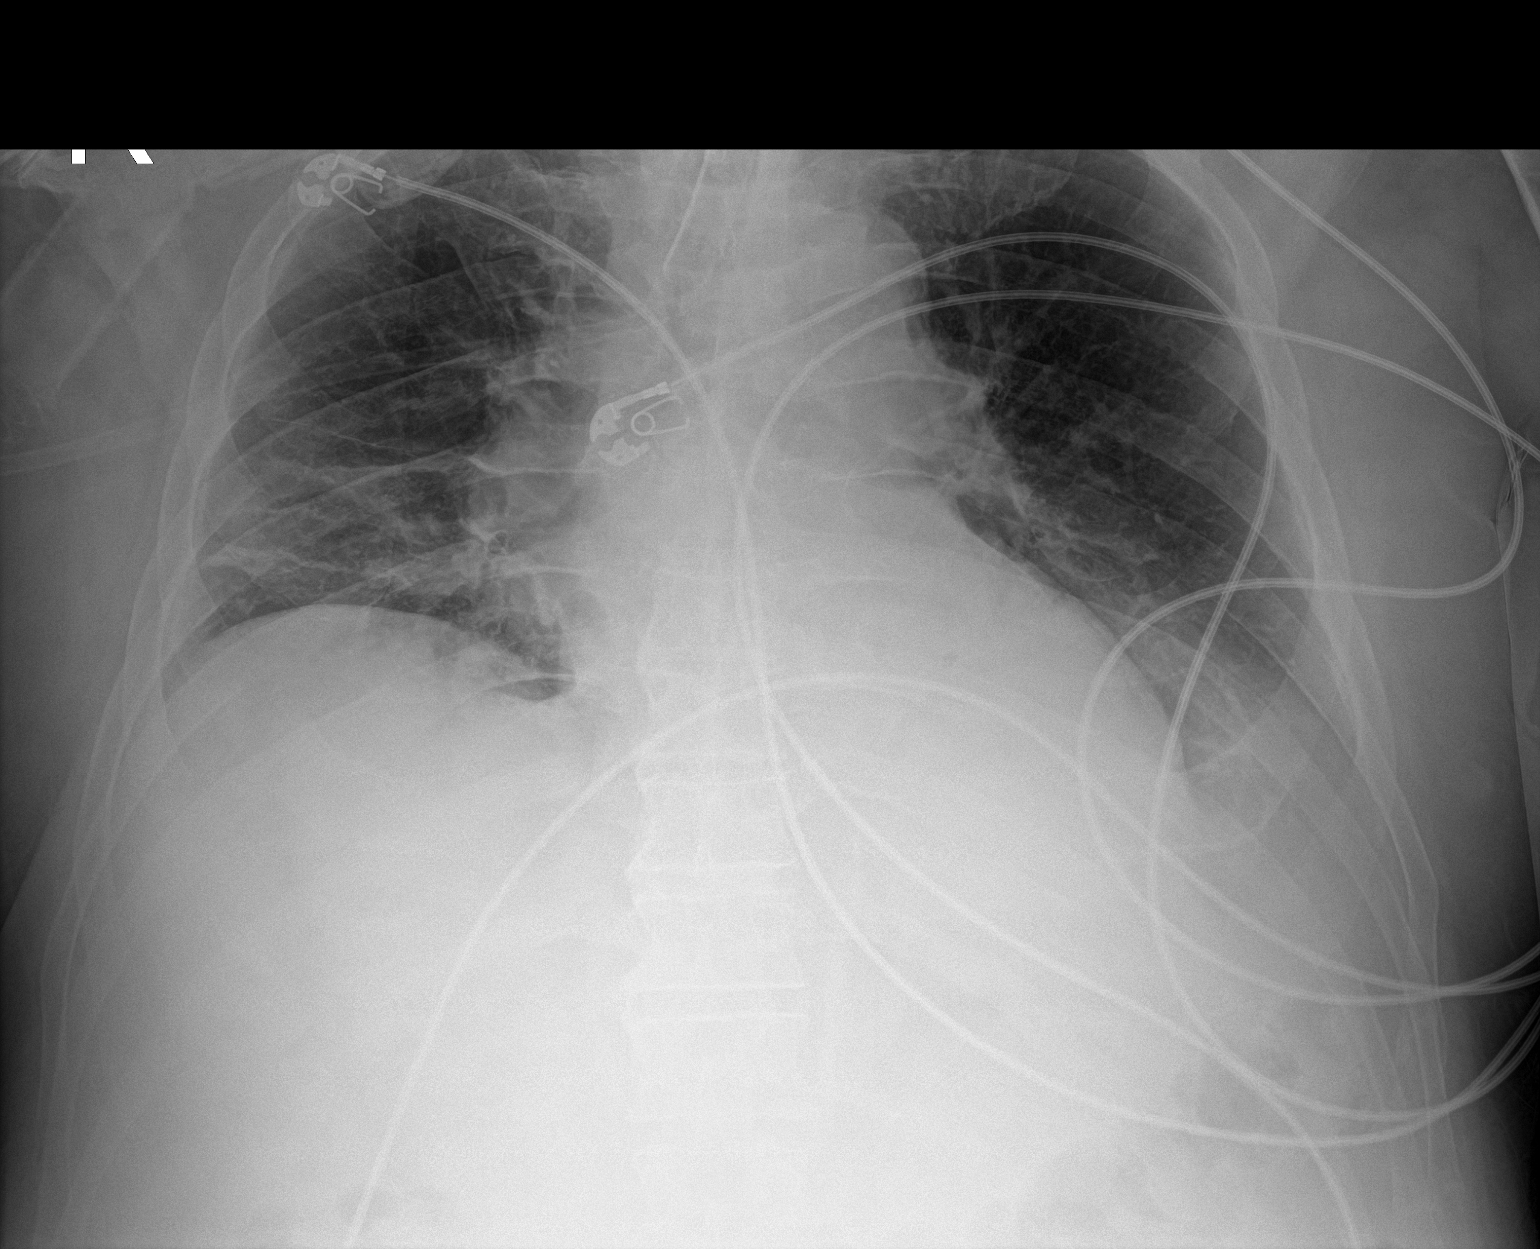

[1 of 1 positions shown; findings below may reference images not displayed]

FINDINGS: Endotracheal tube tip is between the clavicular heads and carina.

Low volume chest with dense and streaky opacities at the bases. Left
diaphragm is now obscured. There may be a left pleural effusion. No
pulmonary edema or pneumothorax.

Artifact from EKG leads
IMPRESSION: 1. Interval bilateral pneumonia or atelectasis.
2. New endotracheal tube in good position.

## 2020-09-14 ENCOUNTER — Encounter: Payer: Self-pay | Admitting: Family Medicine

## 2020-09-21 ENCOUNTER — Other Ambulatory Visit: Payer: Self-pay | Admitting: Family Medicine

## 2020-12-17 ENCOUNTER — Other Ambulatory Visit: Payer: Self-pay | Admitting: Family Medicine

## 2020-12-20 DIAGNOSIS — F419 Anxiety disorder, unspecified: Secondary | ICD-10-CM | POA: Diagnosis not present

## 2020-12-20 DIAGNOSIS — Z79899 Other long term (current) drug therapy: Secondary | ICD-10-CM | POA: Diagnosis not present

## 2020-12-20 DIAGNOSIS — Q613 Polycystic kidney, unspecified: Secondary | ICD-10-CM | POA: Diagnosis not present

## 2020-12-20 DIAGNOSIS — E119 Type 2 diabetes mellitus without complications: Secondary | ICD-10-CM | POA: Diagnosis not present

## 2020-12-22 ENCOUNTER — Other Ambulatory Visit: Payer: Medicare Other

## 2020-12-31 ENCOUNTER — Ambulatory Visit: Payer: Medicare Other | Admitting: Cardiology

## 2021-01-18 ENCOUNTER — Telehealth: Payer: Self-pay | Admitting: Family Medicine

## 2021-01-18 NOTE — Telephone Encounter (Signed)
Spoke with patient he stated he moved to [place], [place], new pcp is Fanny Bien at [facility]

## 2024-03-04 LAB — HEMOGLOBIN A1C: Hemoglobin A1C: 6

## 2024-07-17 ENCOUNTER — Encounter: Payer: Self-pay | Admitting: Family Medicine

## 2024-07-17 ENCOUNTER — Ambulatory Visit (INDEPENDENT_AMBULATORY_CARE_PROVIDER_SITE_OTHER): Admitting: Family Medicine

## 2024-07-17 ENCOUNTER — Ambulatory Visit: Payer: Self-pay | Admitting: Family Medicine

## 2024-07-17 VITALS — BP 108/72 | HR 69 | Temp 98.0°F | Ht 73.0 in | Wt 253.8 lb

## 2024-07-17 DIAGNOSIS — E118 Type 2 diabetes mellitus with unspecified complications: Secondary | ICD-10-CM

## 2024-07-17 DIAGNOSIS — F411 Generalized anxiety disorder: Secondary | ICD-10-CM | POA: Diagnosis not present

## 2024-07-17 DIAGNOSIS — N1831 Chronic kidney disease, stage 3a: Secondary | ICD-10-CM | POA: Diagnosis not present

## 2024-07-17 DIAGNOSIS — Z7984 Long term (current) use of oral hypoglycemic drugs: Secondary | ICD-10-CM | POA: Diagnosis not present

## 2024-07-17 DIAGNOSIS — Z79899 Other long term (current) drug therapy: Secondary | ICD-10-CM

## 2024-07-17 DIAGNOSIS — E119 Type 2 diabetes mellitus without complications: Secondary | ICD-10-CM

## 2024-07-17 DIAGNOSIS — E7800 Pure hypercholesterolemia, unspecified: Secondary | ICD-10-CM | POA: Diagnosis not present

## 2024-07-17 DIAGNOSIS — Z940 Kidney transplant status: Secondary | ICD-10-CM

## 2024-07-17 DIAGNOSIS — Z0000 Encounter for general adult medical examination without abnormal findings: Secondary | ICD-10-CM

## 2024-07-17 LAB — CBC WITH DIFFERENTIAL/PLATELET
Basophils Absolute: 0 K/uL (ref 0.0–0.1)
Basophils Relative: 0.3 % (ref 0.0–3.0)
Eosinophils Absolute: 0 K/uL (ref 0.0–0.7)
Eosinophils Relative: 0.5 % (ref 0.0–5.0)
HCT: 44.9 % (ref 39.0–52.0)
Hemoglobin: 14.8 g/dL (ref 13.0–17.0)
Lymphocytes Relative: 17.1 % (ref 12.0–46.0)
Lymphs Abs: 1.3 K/uL (ref 0.7–4.0)
MCHC: 32.9 g/dL (ref 30.0–36.0)
MCV: 99.2 fl (ref 78.0–100.0)
Monocytes Absolute: 0.5 K/uL (ref 0.1–1.0)
Monocytes Relative: 7 % (ref 3.0–12.0)
Neutro Abs: 5.7 K/uL (ref 1.4–7.7)
Neutrophils Relative %: 75.1 % (ref 43.0–77.0)
Platelets: 131 K/uL — ABNORMAL LOW (ref 150.0–400.0)
RBC: 4.53 Mil/uL (ref 4.22–5.81)
RDW: 14.7 % (ref 11.5–15.5)
WBC: 7.6 K/uL (ref 4.0–10.5)

## 2024-07-17 LAB — COMPREHENSIVE METABOLIC PANEL WITH GFR
ALT: 17 U/L (ref 0–53)
AST: 19 U/L (ref 0–37)
Albumin: 4.1 g/dL (ref 3.5–5.2)
Alkaline Phosphatase: 89 U/L (ref 39–117)
BUN: 32 mg/dL — ABNORMAL HIGH (ref 6–23)
CO2: 26 meq/L (ref 19–32)
Calcium: 8.9 mg/dL (ref 8.4–10.5)
Chloride: 103 meq/L (ref 96–112)
Creatinine, Ser: 1.66 mg/dL — ABNORMAL HIGH (ref 0.40–1.50)
GFR: 40.5 mL/min — ABNORMAL LOW (ref >60.00)
Glucose, Bld: 150 mg/dL — ABNORMAL HIGH (ref 70–99)
Potassium: 4.3 meq/L (ref 3.5–5.1)
Sodium: 138 meq/L (ref 135–145)
Total Bilirubin: 1.1 mg/dL (ref 0.2–1.2)
Total Protein: 6.4 g/dL (ref 6.0–8.3)

## 2024-07-17 LAB — MICROALBUMIN / CREATININE URINE RATIO
Creatinine,U: 55.2 mg/dL
Microalb Creat Ratio: 25.2 mg/g (ref 0.0–30.0)
Microalb, Ur: 1.4 mg/dL (ref 0.0–1.9)

## 2024-07-17 LAB — HEMOGLOBIN A1C: Hgb A1c MFr Bld: 6.2 % (ref 4.6–6.5)

## 2024-07-17 MED ORDER — ALPRAZOLAM 0.25 MG PO TABS: 0.2500 mg | ORAL_TABLET | Freq: Every day | ORAL | 1 refills | Status: AC | PRN

## 2024-07-17 NOTE — Progress Notes (Signed)
 Office Note 07/17/2024  CC:  Chief Complaint  Patient presents with   Establish Care    HPI:  Patient is a 73 y.o. male who is here for annual health maintenance exam and reestablish care. He was my patient up to January 2022, at which time he moved to [place] .  James Zimmerman just recently moved back to the area to be closer to family, is looking for a home in the [place] region.  He has been getting appropriate primary care and nephrology follow-up in [place] .  His only medication change since I saw him is BuSpar.  He takes it infrequently and does not feel like it helps.  He has not been on his alprazolam  in quite a while.  He did find a very small dose (0.25 mg) helpful for anxiety and/or insomnia in the past.  PMP AWARE reviewed today: most recent rx for Vicodin 5/325 was filled 03/29/23, # 10, rx by Dr. Skerdilaid Licaj. No other prescriptions for controlled substances are listed. No red flags.   Past Medical History:  Diagnosis Date   Allergy    Anxiety and depression    crowds, closed in spaces, used to sleep, prn   Cancer (HCC)    Cataract    Chronic renal insufficiency, stage III (moderate) (HCC)    polycystic kidney dz. (followed by Dr. Patel/Nephrology)--kidney transplant 2004.  Cr baseline as of 04/2020 is 1.6-1.9 (GFR 35-56m./min).   Clotting disorder (HCC)    DDD (degenerative disc disease), lumbar    Deep vein thrombosis (DVT) of left upper extremity (HCC)    due to PICC line   Diabetes mellitus with complication (HCC)    Mild non-proliferative diab retinopathy OS.  Hba1c at nephrologist's 02/18/18 was 5.8%. A1c at nephrol 02/2020 is 5.7%.   Diverticulosis    + hx of 'itis   Gastritis 01/2017   likely due to alcohol   GERD (gastroesophageal reflux disease)    Glaucoma    severe   Gout    H/O nonmelanoma skin cancer    SCC (chest, face, head)--s/p Mohs 2009.  BCC-s/p Mohs 2021   H/O polycystic kidney disease    History  of depression    History of kidney transplant    2004    History of retinal detachment    Dr. Jarold   Hx of adenomatous colonic polyps 02/2012; 05/2014; 08/2017   2013 and 2015 tubular adenomas (Dr. Celestia). 08/2017 NO POLYPS->recall 5 yrs.   Hyperlipemia, mixed    Lipids at goal 02/2020   Hypertension    Osteoarthritis    s/p R THR   PAF (paroxysmal atrial fibrillation) (HCC) 04/2020   dx'd while in hosp for sepsis/resp failure: pt declines anticoag b/c of fear of bleeding   Sepsis (HCC) 04/2020   suspect urinary source; hosp complicated by PAF, acute delirium, acute resp fail requ intub, acute on chron renal failur, ? etoh w/drawal.    Past Surgical History:  Procedure Laterality Date   COLONOSCOPY  06/10/14; 09/05/17   2015: Tubular adenoma x 4: recall 3 yrs (Dr. Mylene GI). 08/2017 NO POLYPS. Recall 5 yrs.   COLONOSCOPY  03/19/2012   tubular adenoma x 1; diverticulosis, int hem.Procedure: COLONOSCOPY;  Surgeon: Lynwood LITTIE Celestia Mickey., MD;  Location: [facility] ENDOSCOPY;  Service: Endoscopy;  Laterality: N/A;   EYE SURGERY     HOT HEMOSTASIS  03/19/2012   Procedure: HOT HEMOSTASIS (ARGON PLASMA COAGULATION/BICAP);  Surgeon: Lynwood LITTIE Celestia Mickey., MD;  Location: [facility] ENDOSCOPY;  Service: Endoscopy;  Laterality: N/A;   JOINT REPLACEMENT     KIDNEY TRANSPLANT  2004   NOSE SURGERY  09/03/2019   skin cancer   TOTAL HIP ARTHROPLASTY  12/10/2012   Procedure: TOTAL HIP ARTHROPLASTY ANTERIOR APPROACH;  Surgeon: Donnice JONETTA Car, MD;  Location: [facility] ORS;  Service: Orthopedics;  Laterality: Right;   TRANSTHORACIC ECHOCARDIOGRAM  05/02/2020   EF 60-65%, nl LV fxn, no DD, mild LA dilat, Mild to mod AR, Mild AS.    Family History  Problem Relation Age of Onset   Diabetes Mother    Heart attack Mother    Hyperlipidemia Father    Kidney disease Father    Hypertension Father    Kidney disease Brother    Heart disease Brother    Diabetes Maternal Grandmother    Kidney disease Paternal Grandfather     Polycystic kidney disease Other    Anesthesia problems Neg Hx    Hypotension Neg Hx    Malignant hyperthermia Neg Hx    Pseudochol deficiency Neg Hx     Social History   Socioeconomic History   Marital status: Married    Spouse name: Not on file   Number of children: Not on file   Years of education: Not on file   Highest education level: 12th grade  Occupational History   Not on file  Tobacco Use   Smoking status: Former    Current packs/day: 0.00    Average packs/day: 2.0 packs/day for 30.0 years (60.0 ttl pk-yrs)    Types: Cigarettes    Start date: 12/25/1953    Quit date: 12/26/1983    Years since quitting: 40.5   Smokeless tobacco: Never  Substance and Sexual Activity   Alcohol use: Yes    Alcohol/week: 28.0 standard drinks of alcohol    Types: 28 Glasses of wine per week    Comment: every day 3 glasses of wine   Drug use: No   Sexual activity: Not on file  Other Topics Concern   Not on file  Social History Narrative   Married, one son.   Occupation: retired Pensions consultant.   Orig from [place].   Former smoker x 20 yrs, quit in 68s.   Alcohol: 3 glasses of wine nightly, occ beer, rare hard liquor.   Social Drivers of Corporate investment banker Strain: Low Risk  (07/13/2024)   Overall Financial Resource Strain (CARDIA)    Difficulty of Paying Living Expenses: Not very hard  Food Insecurity: No Food Insecurity (07/13/2024)   Hunger Vital Sign    Worried About Running Out of Food in the Last Year: Never true    Ran Out of Food in the Last Year: Never true  Transportation Needs: No Transportation Needs (07/13/2024)   PRAPARE - Administrator, Civil Service (Medical): No    Lack of Transportation (Non-Medical): No  Physical Activity: Inactive (07/13/2024)   Exercise Vital Sign    Days of Exercise per Week: 0 days    Minutes of Exercise per Session: Not on file  Stress: Stress Concern Present (07/13/2024)   [employer] of Occupational  Health - Occupational Stress Questionnaire    Feeling of Stress: To some extent  Social Connections: Moderately Integrated (07/13/2024)   Social Connection and Isolation Panel    Frequency of Communication with Friends and Family: Once a week    Frequency of Social Gatherings with Friends and Family: Once a week    Attends Religious Services: 1 to 4 times  per year    Active Member of Clubs or Organizations: Yes    Attends Banker Meetings: 1 to 4 times per year    Marital Status: Married  Catering manager Violence: Not on file    Outpatient Medications Prior to Visit  Medication Sig Dispense Refill   ACCU-CHEK SMARTVIEW test strip USE TO TEST BLOOD SUGAR EVERY DAY 100 each 3   allopurinol  (ZYLOPRIM ) 100 MG tablet Take 200 mg by mouth daily.      amLODipine  (NORVASC ) 5 MG tablet Take 5 mg by mouth daily.     FLUoxetine  (PROZAC ) 40 MG capsule Take 40 mg by mouth daily. Patient takes in am     glipiZIDE (GLUCOTROL) 5 MG tablet Take 5 mg by mouth See admin instructions. Take 10mg  in the morning, and 5mg  in the evening.     MAGNESIUM -OXIDE 400 (241.3 Mg) MG tablet Take 400 mg by mouth daily.   6   metoprolol  tartrate (LOPRESSOR ) 50 MG tablet Take 1 tablet (50 mg total) by mouth 2 (two) times daily. 60 tablet 6   mycophenolate  (CELLCEPT ) 250 MG capsule Take 250 mg by mouth 2 (two) times daily.      pantoprazole  (PROTONIX ) 20 MG tablet Take 20 mg by mouth every morning.      pioglitazone  (ACTOS ) 45 MG tablet TAKE 1 TABLET(45 MG) BY MOUTH DAILY 90 tablet 0   predniSONE  (DELTASONE ) 5 MG tablet Take 5 mg by mouth daily.     rosuvastatin  (CRESTOR ) 20 MG tablet TAKE 1 TABLET(20 MG) BY MOUTH DAILY 30 tablet 0   tacrolimus  (PROGRAF ) 0.5 MG capsule Take 1 capsule by mouth 2 (two) times daily.  6   tacrolimus  (PROGRAF ) 1 MG capsule Take 1 mg by mouth 2 (two) times daily.      ZIOPTAN 0.0015 % SOLN Place 1 drop into both eyes at bedtime.  3   ALPRAZolam  (XANAX ) 0.25 MG tablet Take 1 tablet  (0.25 mg total) by mouth daily as needed. (Patient taking differently: Take 0.25 mg by mouth daily as needed for anxiety or sleep. ) 30 tablet 5   busPIRone (BUSPAR) 5 MG tablet Take 5 mg by mouth 3 (three) times daily.     apixaban  (ELIQUIS ) 5 MG TABS tablet Take 1 tablet (5 mg total) by mouth 2 (two) times daily. (Patient not taking: Reported on 05/26/2020) 60 tablet 0   No facility-administered medications prior to visit.    Allergies  Allergen Reactions   Lipitor [Atorvastatin] Other (See Comments)    Headaches   Penicillins Rash   Zocor  [Simvastatin ] Other (See Comments)    Myalgia    Review of Systems  Constitutional:  Negative for appetite change, chills, fatigue and fever.  HENT:  Negative for congestion, dental problem, ear pain and sore throat.   Eyes:  Negative for discharge, redness and visual disturbance.  Respiratory:  Negative for cough, chest tightness, shortness of breath and wheezing.   Cardiovascular:  Negative for chest pain, palpitations and leg swelling.  Gastrointestinal:  Negative for abdominal pain, blood in stool, diarrhea, nausea and vomiting.  Genitourinary:  Negative for difficulty urinating, dysuria, flank pain, frequency, hematuria and urgency.  Musculoskeletal:  Negative for arthralgias, back pain, joint swelling, myalgias and neck stiffness.  Skin:  Negative for pallor and rash.  Neurological:  Negative for dizziness, speech difficulty, weakness and headaches.  Hematological:  Negative for adenopathy. Does not bruise/bleed easily.  Psychiatric/Behavioral:  Negative for confusion and sleep disturbance. The patient is not nervous/anxious.  PE;    07/17/2024   12:58 PM 06/22/2020   10:51 AM 05/26/2020   11:07 AM  Vitals with BMI  Height 6' 1 6' 0 6' 0  Weight 253 lbs 13 oz 234 lbs 236 lbs 3 oz  BMI 33.49 31.73 32.03  Systolic 108 126 894  Diastolic 72 81 69  Pulse 69 74 73   Gen: Alert, well appearing.  Patient is oriented to person, place,  time, and situation. AFFECT: pleasant, lucid thought and speech. ENT: Ears: EACs clear, normal epithelium.  TMs with good light reflex and landmarks bilaterally.  Eyes: no injection, icteris, swelling, or exudate.  EOMI, PERRLA. Nose: no drainage or turbinate edema/swelling.  No injection or focal lesion.  Mouth: lips without lesion/swelling.  Oral mucosa pink and moist.  Dentition intact and without obvious caries or gingival swelling.  Oropharynx without erythema, exudate, or swelling.  Neck: supple/nontender.  No LAD, mass, or TM.  Carotid pulses 2+ bilaterally, without bruits. CV: RRR, no m/r/g.   LUNGS: CTA bilat, nonlabored resps, good aeration in all lung fields. ABD: soft, NT, ND, BS normal.  No hepatospenomegaly or mass.  No bruits. EXT: no clubbing, cyanosis, or edema.  Musculoskeletal: no joint swelling, erythema, warmth, or tenderness.  ROM of all joints intact. Skin - no sores or suspicious lesions or rashes or color changes  Pertinent labs:  Lab Results  Component Value Date   TSH 2.579 05/05/2020   Lab Results  Component Value Date   WBC 7.2 08/23/2020   HGB 13.2 (A) 08/23/2020   HCT 40 (A) 08/23/2020   MCV 98.9 05/08/2020   PLT 135 (A) 08/23/2020   Lab Results  Component Value Date   CREATININE 1.9 (A) 08/23/2020   BUN 45 (A) 08/23/2020   NA 138 08/23/2020   K 4.6 08/23/2020   CL 102 08/23/2020   CO2 20 08/23/2020   Lab Results  Component Value Date   ALT 9 (A) 08/23/2020   AST 14 08/23/2020   ALKPHOS 132 (A) 08/23/2020   BILITOT 0.9 05/26/2020   Lab Results  Component Value Date   CHOL 157 02/27/2020   Lab Results  Component Value Date   HDL 60 08/23/2020   Lab Results  Component Value Date   LDLCALC 71 08/23/2020   Lab Results  Component Value Date   TRIG 162 (A) 08/23/2020   Lab Results  Component Value Date   CHOLHDL 3 06/23/2019   Lab Results  Component Value Date   HGBA1C 6.0 03/04/2024   ASSESSMENT AND PLAN:   New patient,  reestablishing care.  #1 health maintenance exam: Reviewed age and gender appropriate health maintenance issues (prudent diet, regular exercise, health risks of tobacco and excessive alcohol, use of seatbelts, fire alarms in home, use of sunscreen).  Also reviewed age and gender appropriate health screening as well as vaccine recommendations. Vaccines: all UTD Labs: CMET, CBC, urine microalb/cr. Prostate ca screening: pt declines and further PSA screening Colon ca screening: hx adenomas.  Last colonoscopy clear, 5 yr recall was recommended.  Pt declines at this time.  #2 diabetes with retinopathy. Control has been good.  Most recent hemoglobin A1c in March this year was 6.0% Will repeat A1c today and get BUN/creatinine and urine microalbumin. Continue glipizide 10 mg in the morning and 5 mg in the evening.  #3 hypertension, well-controlled on amlodipine  5 mg a day and Lopressor  50 mg twice daily. Monitor electrolytes and creatinine today.  4.  Chronic renal insufficiency stage  III, history of renal transplant. Monitor electrolytes and creatinine today. Will refer to [facility] to get reestablished.  #5 GAD, history of depression. Continue fluoxetine  40 mg a day. Will discontinue BuSpar. Start alprazolam  0.25 mg daily as needed.  #90, refill x 1.  #6 hypercholesterolemia. He takes rosuvastatin  20 mg a day. He is not fasting today.  We will check fasting lipid panel and I see him back for follow-up in 3 months.  An After Visit Summary was printed and given to the patient.  FOLLOW UP:  Return in about 3 months (around 10/17/2024) for routine chronic illness f/u.  Signed:  Gerlene Hockey, MD           07/17/2024

## 2024-07-29 ENCOUNTER — Other Ambulatory Visit: Payer: Self-pay

## 2024-07-29 ENCOUNTER — Other Ambulatory Visit: Payer: Self-pay | Admitting: Family Medicine

## 2024-07-29 MED ORDER — MYCOPHENOLATE MOFETIL 250 MG PO CAPS: 250.0000 mg | ORAL_CAPSULE | Freq: Two times a day (BID) | ORAL | 1 refills | Status: DC

## 2024-07-29 MED ORDER — TACROLIMUS 1 MG PO CAPS: 1.0000 mg | ORAL_CAPSULE | Freq: Two times a day (BID) | ORAL | 1 refills | Status: DC

## 2024-07-29 NOTE — Addendum Note (Signed)
 Addended by: FLETA CARE D on: 07/29/2024 11:23 AM   Modules accepted: Orders

## 2024-07-29 NOTE — Telephone Encounter (Signed)
 Pt is requesting refills for:  Mycophenolate  Tacrolimus   Please fill, if appropriate. Both pending

## 2024-07-30 ENCOUNTER — Encounter: Payer: Self-pay | Admitting: Family Medicine

## 2024-07-30 ENCOUNTER — Telehealth: Payer: Self-pay

## 2024-07-30 ENCOUNTER — Other Ambulatory Visit: Payer: Self-pay | Admitting: Family Medicine

## 2024-07-30 DIAGNOSIS — Z940 Kidney transplant status: Secondary | ICD-10-CM

## 2024-07-30 NOTE — Telephone Encounter (Signed)
 Please confirm dx code for both medications, insurance requiring dx code for coverage

## 2024-07-30 NOTE — Telephone Encounter (Signed)
 Communication  Reason for CRM: Received the tacrolimus  (PROGRAF ) 1 MG capsule prescription for patient and trying to bill Medicare but needs a diagnosis code.        [facility] [phone]   Previously forwarded to provider.

## 2024-09-22 ENCOUNTER — Other Ambulatory Visit: Payer: Self-pay | Admitting: Family Medicine

## 2024-09-26 ENCOUNTER — Other Ambulatory Visit: Payer: Self-pay | Admitting: Family Medicine

## 2024-09-26 ENCOUNTER — Other Ambulatory Visit: Payer: Self-pay

## 2024-09-26 MED ORDER — MYCOPHENOLATE MOFETIL 250 MG PO CAPS: 250.0000 mg | ORAL_CAPSULE | Freq: Two times a day (BID) | ORAL | 1 refills | Status: DC

## 2024-09-30 LAB — COMPREHENSIVE METABOLIC PANEL WITH GFR
Albumin: 4.1 (ref 3.5–5.0)
Calcium: 9.1 (ref 8.7–10.7)
Globulin: 1.9
eGFR: 37

## 2024-09-30 LAB — BASIC METABOLIC PANEL WITH GFR
BUN: 34 — AB (ref 4–21)
CO2: 23 — AB (ref 13–22)
Chloride: 104 (ref 99–108)
Creatinine: 1.9 — AB (ref 0.6–1.3)
Glucose: 121
Potassium: 4.3 meq/L (ref 3.5–5.1)
Sodium: 139 (ref 137–147)

## 2024-09-30 LAB — MICROALBUMIN / CREATININE URINE RATIO
Albumin, Urine POC: 27.8
Creatinine, POC: 122.2 mg/dL
EGFR: 37
Microalb Creat Ratio: 23
PTH, Intact: 90

## 2024-09-30 LAB — HEPATIC FUNCTION PANEL
ALT: 20 U/L (ref 10–40)
AST: 25 (ref 14–40)
Alkaline Phosphatase: 104 (ref 25–125)
Bilirubin, Total: 0.8

## 2024-09-30 LAB — CBC AND DIFFERENTIAL
HCT: 46 (ref 41–53)
Hemoglobin: 14.7 (ref 13.5–17.5)
Neutrophils Absolute: 5.2
Platelets: 134 K/uL — AB (ref 150–400)
WBC: 7.7

## 2024-09-30 LAB — VITAMIN D 25 HYDROXY (VIT D DEFICIENCY, FRACTURES): Vit D, 25-Hydroxy: 15.9

## 2024-09-30 LAB — CBC: RBC: 4.51 (ref 3.87–5.11)

## 2024-09-30 LAB — PROTEIN / CREATININE RATIO, URINE
Albumin, U: 27.8
Creatinine, Urine: 122.2

## 2024-10-01 ENCOUNTER — Other Ambulatory Visit: Payer: Self-pay | Admitting: Family Medicine

## 2024-10-01 DIAGNOSIS — Z940 Kidney transplant status: Secondary | ICD-10-CM

## 2024-11-11 ENCOUNTER — Other Ambulatory Visit: Payer: Self-pay

## 2024-11-11 MED ORDER — EMPAGLIFLOZIN 10 MG PO TABS: 10.0000 mg | ORAL_TABLET | Freq: Every day | ORAL | 0 refills | Status: DC

## 2024-11-11 NOTE — Telephone Encounter (Signed)
 Pt is requesting refill for Jardiance 10 mg. Prior sig directions, take 1 tab po qAM. Pt's last OV 7/25 for CPE.  Please fill, if appropriate. Med is not currently on rx list

## 2024-11-11 NOTE — Telephone Encounter (Signed)
(  Looks like he has been on this med per review of 04/2024 nephrologist f/u in [facility], but when he re-established care in July we did not get that info.)  30d rx sent. Due for f/u.

## 2024-11-12 NOTE — Telephone Encounter (Signed)
 Pt advised medication refilled but due for appt, appt scheduled 11/24

## 2024-11-17 ENCOUNTER — Encounter: Payer: Self-pay | Admitting: Family Medicine

## 2024-11-17 ENCOUNTER — Ambulatory Visit (INDEPENDENT_AMBULATORY_CARE_PROVIDER_SITE_OTHER): Admitting: Family Medicine

## 2024-11-17 VITALS — BP 103/67 | HR 66 | Temp 97.2°F | Ht 73.0 in | Wt 248.6 lb

## 2024-11-17 DIAGNOSIS — N2889 Other specified disorders of kidney and ureter: Secondary | ICD-10-CM

## 2024-11-17 DIAGNOSIS — Z79899 Other long term (current) drug therapy: Secondary | ICD-10-CM

## 2024-11-17 DIAGNOSIS — E1121 Type 2 diabetes mellitus with diabetic nephropathy: Secondary | ICD-10-CM | POA: Diagnosis not present

## 2024-11-17 DIAGNOSIS — Z7984 Long term (current) use of oral hypoglycemic drugs: Secondary | ICD-10-CM

## 2024-11-17 DIAGNOSIS — E119 Type 2 diabetes mellitus without complications: Secondary | ICD-10-CM | POA: Diagnosis not present

## 2024-11-17 DIAGNOSIS — F411 Generalized anxiety disorder: Secondary | ICD-10-CM

## 2024-11-17 DIAGNOSIS — Z23 Encounter for immunization: Secondary | ICD-10-CM

## 2024-11-17 DIAGNOSIS — I10 Essential (primary) hypertension: Secondary | ICD-10-CM | POA: Diagnosis not present

## 2024-11-17 DIAGNOSIS — E7800 Pure hypercholesterolemia, unspecified: Secondary | ICD-10-CM | POA: Diagnosis not present

## 2024-11-17 LAB — POCT GLYCOSYLATED HEMOGLOBIN (HGB A1C)
HbA1c POC (<> result, manual entry): 5.9 % (ref 4.0–5.6)
HbA1c, POC (controlled diabetic range): 5.9 % (ref 0.0–7.0)
HbA1c, POC (prediabetic range): 5.9 % (ref 5.7–6.4)
Hemoglobin A1C: 5.9 % — AB (ref 4.0–5.6)

## 2024-11-17 MED ORDER — EMPAGLIFLOZIN 10 MG PO TABS: 10.0000 mg | ORAL_TABLET | Freq: Every day | ORAL | 3 refills | Status: DC

## 2024-11-17 MED ORDER — MYCOPHENOLATE MOFETIL 250 MG PO CAPS: 250.0000 mg | ORAL_CAPSULE | Freq: Two times a day (BID) | ORAL | 3 refills | Status: DC

## 2024-11-17 NOTE — Progress Notes (Signed)
 OFFICE VISIT  11/17/2024  CC:  Chief Complaint  Patient presents with   Medical Management of Chronic Issues    Pt is not fasting    Patient is a 73 y.o. male who presents for 37-month follow-up diabetes, hypertension, chronic renal insufficiency/history of renal transplant, GAD, and hypercholesterolemia. A/P as of last visit: 1 diabetes with retinopathy. Control has been good.  Most recent hemoglobin A1c in March this year was 6.0% Will repeat A1c today and get BUN/creatinine and urine microalbumin. Continue glipizide 10 mg in the morning and 5 mg in the evening.   #2 hypertension, well-controlled on amlodipine  5 mg a day and Lopressor  50 mg twice daily. Monitor electrolytes and creatinine today.   3.  Chronic renal insufficiency stage III, history of renal transplant. Monitor electrolytes and creatinine today. Will refer to [facility] to get reestablished.   #4 GAD, history of depression. Continue fluoxetine  40 mg a day. Will discontinue BuSpar. Start alprazolam  0.25 mg daily as needed.  #90, refill x 1.   #5 hypercholesterolemia. He takes rosuvastatin  20 mg a day. He is not fasting today.  We will check fasting lipid panel and I see him back for follow-up in 3 months.  INTERIM HX: Feeling well. He has re-established care with nephrologist locally, Dr. Tobie. No changes were made.   Unfortunately [facility] is still dealing with insurance problems.  His new insurance does not kick in until next month. He says he has all of his medicines except for Jardiance .   PMP AWARE reviewed today: most recent rx for alprazolam  0.25 mg was filled 10/27/2024, # 90, rx by me. No red flags.   Past Medical History:  Diagnosis Date   Allergy    Anxiety and depression    crowds, closed in spaces, used to sleep, prn   Cataract    Chronic renal insufficiency, stage III (moderate)    polycystic kidney dz. (followed by Dr. Patel/Nephrology)--kidney transplant 2004.  Cr  baseline as of 04/2020 is 1.6-1.9 (GFR 35-38m./min).   DDD (degenerative disc disease), lumbar    Deep vein thrombosis (DVT) of left upper extremity (HCC)    due to PICC line   Diabetes mellitus with complication (HCC)    Mild non-proliferative diab retinopathy OS.  Hba1c at nephrologist's 02/18/18 was 5.8%. A1c at nephrol 02/2020 is 5.7%.   Diverticulosis    + hx of 'itis   Gastritis 01/2017   likely due to alcohol   GERD (gastroesophageal reflux disease)    Glaucoma    severe   Gout    H/O nonmelanoma skin cancer    SCC (chest, face, head)--s/p Mohs 2009.  BCC-s/p Mohs 2021   H/O polycystic kidney disease    History of kidney transplant    2004    History of retinal detachment    Dr. Jarold   Hx of adenomatous colonic polyps 02/2012; 05/2014; 08/2017   2013 and 2015 tubular adenomas (Dr. Celestia). 08/2017 NO POLYPS->recall 5 yrs.   Hyperlipemia, mixed    Lipids at goal 02/2020   Hypertension    Osteoarthritis    s/p R THR   PAF (paroxysmal atrial fibrillation) (HCC) 04/2020   dx'd while in hosp for sepsis/resp failure: pt declines anticoag b/c of fear of bleeding   Sepsis (HCC) 04/2020   suspect urinary source; hosp complicated by PAF, acute delirium, acute resp fail requ intub, acute on chron renal failur, ? etoh w/drawal.    Past Surgical History:  Procedure Laterality Date  COLONOSCOPY  06/10/14; 09/05/17   2015: Tubular adenoma x 4: recall 3 yrs (Dr. Mylene GI). 08/2017 NO POLYPS. Recall 5 yrs.   COLONOSCOPY  03/19/2012   tubular adenoma x 1; diverticulosis, int hem.Procedure: COLONOSCOPY;  Surgeon: Lynwood LITTIE Celestia Mickey., MD;  Location: [facility] ENDOSCOPY;  Service: Endoscopy;  Laterality: N/A;   EYE SURGERY     HOT HEMOSTASIS  03/19/2012   Procedure: HOT HEMOSTASIS (ARGON PLASMA COAGULATION/BICAP);  Surgeon: Lynwood LITTIE Celestia Mickey., MD;  Location: [facility] ENDOSCOPY;  Service: Endoscopy;  Laterality: N/A;   KIDNEY TRANSPLANT  2004   NOSE SURGERY  09/03/2019   skin cancer   TOTAL HIP  ARTHROPLASTY  12/10/2012   Procedure: TOTAL HIP ARTHROPLASTY ANTERIOR APPROACH;  Surgeon: Donnice JONETTA Car, MD;  Location: [facility] ORS;  Service: Orthopedics;  Laterality: Right;   TRANSTHORACIC ECHOCARDIOGRAM  05/02/2020   EF 60-65%, nl LV fxn, no DD, mild LA dilat, Mild to mod AR, Mild AS.    Outpatient Medications Prior to Visit  Medication Sig Dispense Refill   ACCU-CHEK SMARTVIEW test strip USE TO TEST BLOOD SUGAR EVERY DAY 100 each 3   allopurinol  (ZYLOPRIM ) 100 MG tablet Take 200 mg by mouth daily.      ALPRAZolam  (XANAX ) 0.25 MG tablet Take 1 tablet (0.25 mg total) by mouth daily as needed for anxiety or sleep. 90 tablet 1   amLODipine  (NORVASC ) 5 MG tablet Take 5 mg by mouth daily.     FLUoxetine  (PROZAC ) 40 MG capsule Take 40 mg by mouth daily. Patient takes in am     glipiZIDE (GLUCOTROL) 5 MG tablet Take 5 mg by mouth See admin instructions. Take 10mg  in the morning, and 5mg  in the evening.     MAGNESIUM -OXIDE 400 (241.3 Mg) MG tablet Take 400 mg by mouth daily.   6   metoprolol  tartrate (LOPRESSOR ) 50 MG tablet Take 1 tablet (50 mg total) by mouth 2 (two) times daily. 60 tablet 6   pantoprazole  (PROTONIX ) 20 MG tablet Take 20 mg by mouth every morning.      pioglitazone  (ACTOS ) 45 MG tablet TAKE 1 TABLET(45 MG) BY MOUTH DAILY 90 tablet 0   predniSONE  (DELTASONE ) 5 MG tablet Take 5 mg by mouth daily.     rosuvastatin  (CRESTOR ) 20 MG tablet TAKE 1 TABLET(20 MG) BY MOUTH DAILY 30 tablet 0   tacrolimus  (PROGRAF ) 1 MG capsule TAKE 1 CAPSULE BY MOUTH TWICE A DAY 180 capsule 1   ZIOPTAN 0.0015 % SOLN Place 1 drop into both eyes at bedtime.  3   empagliflozin  (JARDIANCE ) 10 MG TABS tablet Take 1 tablet (10 mg total) by mouth daily. 30 tablet 0   mycophenolate  (CELLCEPT ) 250 MG capsule Take 1 capsule (250 mg total) by mouth 2 (two) times daily. 180 capsule 1   No facility-administered medications prior to visit.    Allergies  Allergen Reactions   Lipitor [Atorvastatin] Other (See Comments)     Headaches   Penicillins Rash   Zocor  [Simvastatin ] Other (See Comments)    Myalgia    Review of Systems As per HPI  PE:    11/17/2024    1:13 PM 07/17/2024   12:58 PM 06/22/2020   10:51 AM  Vitals with BMI  Height 6' 1 6' 1 6' 0  Weight 248 lbs 10 oz 253 lbs 13 oz 234 lbs  BMI 32.81 33.49 31.73  Systolic 103 108 873  Diastolic 67 72 81  Pulse 66 69 74     Physical Exam  Gen: Alert, well appearing.  Patient is oriented to person, place, time, and situation. AFFECT: pleasant, lucid thought and speech. Cardiovascular: Regular rhythm and rate with soft systolic murmur.  No diastolic murmur.  No rub. Lungs are clear bilaterally, breathing nonlabored. Extremities: No edema.  LABS:  Last CBC Lab Results  Component Value Date   WBC 7.6 07/17/2024   HGB 14.8 07/17/2024   HCT 44.9 07/17/2024   MCV 99.2 07/17/2024   MCH 31.3 05/08/2020   RDW 14.7 07/17/2024   PLT 131.0 (L) 07/17/2024   Last metabolic panel Lab Results  Component Value Date   GLUCOSE 150 (H) 07/17/2024   NA 138 07/17/2024   K 4.3 07/17/2024   CL 103 07/17/2024   CO2 26 07/17/2024   BUN 32 (H) 07/17/2024   CREATININE 1.66 (H) 07/17/2024   GFR 40.50 (L) 07/17/2024   CALCIUM  8.9 07/17/2024   PHOS 3.2 05/08/2020   PROT 6.4 07/17/2024   ALBUMIN 4.1 07/17/2024   BILITOT 1.1 07/17/2024   ALKPHOS 89 07/17/2024   AST 19 07/17/2024   ALT 17 07/17/2024   ANIONGAP 14 05/08/2020   Last lipids Lab Results  Component Value Date   CHOL 157 02/27/2020   HDL 60 08/23/2020   LDLCALC 71 08/23/2020   LDLDIRECT 91.0 06/23/2019   TRIG 162 (A) 08/23/2020   CHOLHDL 3 06/23/2019   Last hemoglobin A1c Lab Results  Component Value Date   HGBA1C 5.9 (A) 11/17/2024   HGBA1C 5.9 11/17/2024   HGBA1C 5.9 11/17/2024   HGBA1C 5.9 11/17/2024   Last thyroid  functions Lab Results  Component Value Date   TSH 2.579 05/05/2020   Last vitamin B12 and Folate Lab Results  Component Value Date   VITAMINB12 775  05/06/2020   FOLATE 15.1 05/07/2020   IMPRESSION AND PLAN:  1 diabetes with retinopathy. Point-of-care hemoglobin A1c today is 5.9 % Continue glipizide 10 mg in the morning and 5 mg in the evening. Continue pioglitazone  45 mg a day.  He will only temporarily be out of Jardiance  10 mg due to insurance problems.  #2 hypertension, well-controlled on amlodipine  5 mg a day and Lopressor  50 mg twice daily. No labs today--> he will be getting labs via his nephrologist on 12/05/2024.   3.  Chronic renal insufficiency stage III, history of renal transplant. He is now reestablished with Dr. Tobie locally.   #4 GAD, history of depression. Continue fluoxetine  40 mg a day. Continue alprazolam  0.25 mg daily as needed. A new prescription was not needed today.  #5 hypercholesterolemia. He takes rosuvastatin  20 mg a day. He is not fasting today.  We will check fasting lipid panel and I see him back for follow-up in 3 months.  An After Visit Summary was printed and given to the patient.  FOLLOW UP: Return in about 3 months (around 02/17/2025) for routine chronic illness f/u. Next CPE July/August 2026 Signed:  Gerlene Hockey, MD           11/17/2024

## 2024-11-18 ENCOUNTER — Other Ambulatory Visit: Payer: Self-pay

## 2024-11-18 MED ORDER — MYCOPHENOLATE MOFETIL 250 MG PO CAPS: 250.0000 mg | ORAL_CAPSULE | Freq: Two times a day (BID) | ORAL | 3 refills | Status: AC

## 2024-11-18 MED ORDER — EMPAGLIFLOZIN 10 MG PO TABS
10.0000 mg | ORAL_TABLET | Freq: Every day | ORAL | 3 refills | Status: AC
Start: 2024-11-18 — End: ?

## 2024-11-24 ENCOUNTER — Other Ambulatory Visit: Payer: Self-pay | Admitting: Family Medicine

## 2024-12-11 ENCOUNTER — Other Ambulatory Visit: Payer: Self-pay | Admitting: Family Medicine

## 2024-12-11 NOTE — Telephone Encounter (Unsigned)
 Copied from CRM [id number]. Topic: Clinical - Medication Refill >> Dec 11, 2024 11:13 AM Franky GRADE wrote: Medication: allopurinol  (ZYLOPRIM ) 100 MG tablet [[id number]], amLODipine  (NORVASC ) 5 MG tablet [[id number]]  Has the patient contacted their pharmacy? Yes, they asked patient to contact the office as it still reflects his previous PCP as ordering provider.  (Agent: If no, request that the patient contact the pharmacy for the refill. If patient does not wish to contact the pharmacy document the reason why and proceed with request.) (Agent: If yes, when and what did the pharmacy advise?)  This is the patient's preferred pharmacy:  [facility] [phone] - [place], [place] - [address] [place]  [place] [address] Phone: [phone] Fax: [phone]    Is this the correct pharmacy for this prescription? Yes If no, delete pharmacy and type the correct one.   Has the prescription been filled recently? No  Is the patient out of the medication? No  Has the patient been seen for an appointment in the last year OR does the patient have an upcoming appointment? Yes  Can we respond through MyChart? Yes  Agent: Please be advised that Rx refills may take up to 3 business days. We ask that you follow-up with your pharmacy.

## 2024-12-12 MED ORDER — AMLODIPINE BESYLATE 5 MG PO TABS: 5.0000 mg | ORAL_TABLET | Freq: Every day | ORAL | 1 refills | Status: AC

## 2024-12-12 MED ORDER — ALLOPURINOL 100 MG PO TABS: 200.0000 mg | ORAL_TABLET | Freq: Every day | ORAL | 1 refills | Status: AC

## 2024-12-22 LAB — HEPATIC FUNCTION PANEL
ALT: 16 U/L (ref 10–40)
AST: 16 (ref 14–40)
Alkaline Phosphatase: 96 (ref 25–125)
Bilirubin, Total: 0.5

## 2024-12-22 LAB — CBC AND DIFFERENTIAL
HCT: 45 (ref 41–53)
Hemoglobin: 14.3 (ref 13.5–17.5)
Neutrophils Absolute: 5.7
Platelets: 143 K/uL — AB (ref 150–400)
WBC: 8.6

## 2024-12-22 LAB — LIPID PANEL
Cholesterol: 161 (ref 0–200)
HDL: 57 (ref 35–70)
LDL Cholesterol: 77
Triglycerides: 158 (ref 40–160)

## 2024-12-22 LAB — BASIC METABOLIC PANEL WITH GFR
BUN: 24 — AB (ref 4–21)
CO2: 23 — AB (ref 13–22)
Chloride: 103 (ref 99–108)
Creatinine: 1.5 — AB (ref 0.6–1.3)
Glucose: 154
Potassium: 4.4 meq/L (ref 3.5–5.1)
Sodium: 140 (ref 137–147)

## 2024-12-22 LAB — COMPREHENSIVE METABOLIC PANEL WITH GFR
Albumin: 3.9 (ref 3.5–5.0)
Calcium: 8.8 (ref 8.7–10.7)
Globulin: 2.4
eGFR: 51

## 2024-12-22 LAB — CBC: RBC: 4.41 (ref 3.87–5.11)

## 2024-12-30 NOTE — Patient Instructions (Incomplete)
 If you are interested in weight loss counseling please make appt to discuss and bring with you 2 weeks of a food diary/log on notebook paper.  Food log is mandatory on first appt to proceed with counseling.  Weight loss counseling encompasses diet, exercise and can include  medications when appropriate and affordable.  There are routine appts for check-ins and weights to track progress and keep you on track.  Routine check-ins (in person) are also mandatory to continue with prescription refills. Check-in timeline  can range from 4 weeks to 12 weeks, depending on physician's recommendations and which step you are in of your weight loss journey.  Your BMI today is There is no height or weight on file to calculate BMI.  Please check with your insurance prior to appt and ask them if they cover weight loss medications for your BMI? And if so, which medications. They may tell you some of the diabetes meds that are used for weight loss also,  are on your formulary- but this does not mean they are covered for weight loss only.  Even if they tell with a prior auth it is covered- make sure they check to see if you personally meet criteria with your BMI.

## 2024-12-31 ENCOUNTER — Encounter: Payer: Self-pay | Admitting: Family Medicine

## 2024-12-31 ENCOUNTER — Other Ambulatory Visit: Payer: Self-pay | Admitting: Family Medicine

## 2024-12-31 ENCOUNTER — Ambulatory Visit (INDEPENDENT_AMBULATORY_CARE_PROVIDER_SITE_OTHER): Admitting: Family Medicine

## 2024-12-31 VITALS — BP 113/68 | HR 76 | Temp 97.8°F | Ht 73.0 in | Wt 244.8 lb

## 2024-12-31 DIAGNOSIS — R350 Frequency of micturition: Secondary | ICD-10-CM

## 2024-12-31 DIAGNOSIS — N3001 Acute cystitis with hematuria: Secondary | ICD-10-CM

## 2024-12-31 DIAGNOSIS — E11319 Type 2 diabetes mellitus with unspecified diabetic retinopathy without macular edema: Secondary | ICD-10-CM

## 2024-12-31 DIAGNOSIS — Z7984 Long term (current) use of oral hypoglycemic drugs: Secondary | ICD-10-CM | POA: Diagnosis not present

## 2024-12-31 DIAGNOSIS — E119 Type 2 diabetes mellitus without complications: Secondary | ICD-10-CM | POA: Diagnosis not present

## 2024-12-31 DIAGNOSIS — R8290 Unspecified abnormal findings in urine: Secondary | ICD-10-CM

## 2024-12-31 LAB — POCT URINALYSIS DIPSTICK
Bilirubin, UA: NEGATIVE
Glucose, UA: POSITIVE — AB
Ketones, UA: NEGATIVE
Nitrite, UA: NEGATIVE
Protein, UA: POSITIVE — AB
Spec Grav, UA: 1.01
Urobilinogen, UA: 0.2 U/dL
pH, UA: 6

## 2024-12-31 MED ORDER — WEGOVY 0.5 MG/0.5ML ~~LOC~~ SOAJ: 0.5000 mg | SUBCUTANEOUS | 0 refills | Status: DC

## 2024-12-31 MED ORDER — SULFAMETHOXAZOLE-TRIMETHOPRIM 800-160 MG PO TABS: 1.0000 | ORAL_TABLET | Freq: Two times a day (BID) | ORAL | 0 refills | Status: AC

## 2024-12-31 NOTE — Progress Notes (Signed)
 OFFICE VISIT  12/31/2024  CC:  Chief Complaint  Patient presents with   New Medication Request    Pt would like to try Wegovy  pill    Patient is a 74 y.o. male who presents for med discussion, wants to try rybelsus. I last saw him on 11/17/2024. A/P as of that visit: 1 diabetes with retinopathy. Point-of-care hemoglobin A1c today is 5.9 % Continue glipizide 10 mg in the morning and 5 mg in the evening. Continue pioglitazone  45 mg a day.  He will only temporarily be out of Jardiance  10 mg due to insurance problems.   #2 hypertension, well-controlled on amlodipine  5 mg a day and Lopressor  50 mg twice daily. No labs today--> he will be getting labs via his nephrologist on 12/05/2024.   3.  Chronic renal insufficiency stage III, history of renal transplant. He is now reestablished with Dr. Tobie locally.   #4 GAD, history of depression. Continue fluoxetine  40 mg a day. Continue alprazolam  0.25 mg daily as needed. A new prescription was not needed today.   #5 hypercholesterolemia. He takes rosuvastatin  20 mg a day. He is not fasting today.  We will check fasting lipid panel and I see him back for follow-up in 3 months.  INTERIM HX:  Insurance has changed, he is interested in starting GLP-1 agonist.  Glucose is typically around 110-120 in the morning. He is taking glipizide, pioglitazone , and Jardiance .  After he got his flu shot about a month ago he got some aches and fevers and malaise.  Around that time he started to get urinary frequency and slight dysuria. No fever, nausea, abdominal pain, or blood in urine.  Past Medical History:  Diagnosis Date   Allergy    Anxiety and depression    crowds, closed in spaces, used to sleep, prn   Cataract    Chronic renal insufficiency, stage III (moderate)    polycystic kidney dz. (followed by Dr. Patel/Nephrology)--kidney transplant 2004.  Cr baseline as of 04/2020 is 1.6-1.9 (GFR 35-53m./min).   DDD (degenerative disc disease),  lumbar    Deep vein thrombosis (DVT) of left upper extremity (HCC)    due to PICC line   Diabetes mellitus with complication (HCC)    Mild non-proliferative diab retinopathy OS.  Hba1c at nephrologist's 02/18/18 was 5.8%. A1c at nephrol 02/2020 is 5.7%.   Diverticulosis    + hx of 'itis   Gastritis 01/2017   likely due to alcohol   GERD (gastroesophageal reflux disease)    Glaucoma    severe   Gout    H/O nonmelanoma skin cancer    SCC (chest, face, head)--s/p Mohs 2009.  BCC-s/p Mohs 2021   H/O polycystic kidney disease    History of kidney transplant    2004    History of retinal detachment    Dr. Jarold   Hx of adenomatous colonic polyps 02/2012; 05/2014; 08/2017   2013 and 2015 tubular adenomas (Dr. Celestia). 08/2017 NO POLYPS->recall 5 yrs.   Hyperlipemia, mixed    Lipids at goal 02/2020   Hypertension    Osteoarthritis    s/p R THR   PAF (paroxysmal atrial fibrillation) (HCC) 04/2020   dx'd while in hosp for sepsis/resp failure: pt declines anticoag b/c of fear of bleeding   Sepsis (HCC) 04/2020   suspect urinary source; hosp complicated by PAF, acute delirium, acute resp fail requ intub, acute on chron renal failur, ? etoh w/drawal.    Past Surgical History:  Procedure Laterality Date  COLONOSCOPY  06/10/14; 09/05/17   2015: Tubular adenoma x 4: recall 3 yrs (Dr. Mylene GI). 08/2017 NO POLYPS. Recall 5 yrs.   COLONOSCOPY  03/19/2012   tubular adenoma x 1; diverticulosis, int hem.Procedure: COLONOSCOPY;  Surgeon: Lynwood LITTIE Celestia Mickey., MD;  Location: [facility] ENDOSCOPY;  Service: Endoscopy;  Laterality: N/A;   EYE SURGERY     HOT HEMOSTASIS  03/19/2012   Procedure: HOT HEMOSTASIS (ARGON PLASMA COAGULATION/BICAP);  Surgeon: Lynwood LITTIE Celestia Mickey., MD;  Location: [facility] ENDOSCOPY;  Service: Endoscopy;  Laterality: N/A;   KIDNEY TRANSPLANT  2004   NOSE SURGERY  09/03/2019   skin cancer   TOTAL HIP ARTHROPLASTY  12/10/2012   Procedure: TOTAL HIP ARTHROPLASTY ANTERIOR APPROACH;   Surgeon: Donnice JONETTA Car, MD;  Location: [facility] ORS;  Service: Orthopedics;  Laterality: Right;   TRANSTHORACIC ECHOCARDIOGRAM  05/02/2020   EF 60-65%, nl LV fxn, no DD, mild LA dilat, Mild to mod AR, Mild AS.    Outpatient Medications Prior to Visit  Medication Sig Dispense Refill   ACCU-CHEK SMARTVIEW test strip USE TO TEST BLOOD SUGAR EVERY DAY 100 each 3   allopurinol  (ZYLOPRIM ) 100 MG tablet Take 2 tablets (200 mg total) by mouth daily. 180 tablet 1   ALPRAZolam  (XANAX ) 0.25 MG tablet Take 1 tablet (0.25 mg total) by mouth daily as needed for anxiety or sleep. 90 tablet 1   amLODipine  (NORVASC ) 5 MG tablet Take 1 tablet (5 mg total) by mouth daily. 90 tablet 1   empagliflozin  (JARDIANCE ) 10 MG TABS tablet Take 1 tablet (10 mg total) by mouth daily. 90 tablet 3   FLUoxetine  (PROZAC ) 40 MG capsule Take 40 mg by mouth daily. Patient takes in am     furosemide (LASIX) 20 MG tablet Take 20 mg by mouth daily.     glipiZIDE (GLUCOTROL) 5 MG tablet Take 5 mg by mouth See admin instructions. Take 10mg  in the morning, and 5mg  in the evening.     latanoprost (XALATAN) 0.005 % ophthalmic solution Place 1 drop into both eyes at bedtime.     MAGNESIUM -OXIDE 400 (241.3 Mg) MG tablet Take 400 mg by mouth daily.   6   metoprolol  tartrate (LOPRESSOR ) 50 MG tablet Take 1 tablet (50 mg total) by mouth 2 (two) times daily. 60 tablet 6   mycophenolate  (CELLCEPT ) 250 MG capsule Take 1 capsule (250 mg total) by mouth 2 (two) times daily. 180 capsule 3   pantoprazole  (PROTONIX ) 20 MG tablet Take 20 mg by mouth every morning.      pioglitazone  (ACTOS ) 45 MG tablet TAKE 1 TABLET(45 MG) BY MOUTH DAILY 90 tablet 0   predniSONE  (DELTASONE ) 5 MG tablet Take 5 mg by mouth daily.     rosuvastatin  (CRESTOR ) 20 MG tablet TAKE 1 TABLET(20 MG) BY MOUTH DAILY 30 tablet 0   tacrolimus  (PROGRAF ) 1 MG capsule TAKE 1 CAPSULE BY MOUTH TWICE A DAY 180 capsule 1   ZIOPTAN 0.0015 % SOLN Place 1 drop into both eyes at bedtime.  3   No  facility-administered medications prior to visit.    Allergies[1]  Review of Systems As per HPI  PE:    12/31/2024    2:59 PM 11/17/2024    1:13 PM 07/17/2024   12:58 PM  Vitals with BMI  Height 6' 1 6' 1 6' 1  Weight 244 lbs 13 oz 248 lbs 10 oz 253 lbs 13 oz  BMI 32.3 32.81 33.49  Systolic 113 103 891  Diastolic 68 67 72  Pulse 76 66 69     Physical Exam  Gen: Alert, well appearing.  Patient is oriented to person, place, time, and situation. AFFECT: pleasant, lucid thought and speech. No further exam today  LABS:  Last CBC Lab Results  Component Value Date   WBC 8.6 12/22/2024   HGB 14.3 12/22/2024   HCT 45 12/22/2024   MCV 99.2 07/17/2024   MCH 31.3 05/08/2020   RDW 14.7 07/17/2024   PLT 143 (A) 12/22/2024   Last metabolic panel Lab Results  Component Value Date   GLUCOSE 150 (H) 07/17/2024   NA 140 12/22/2024   K 4.4 12/22/2024   CL 103 12/22/2024   CO2 23 (A) 12/22/2024   BUN 24 (A) 12/22/2024   CREATININE 1.5 (A) 12/22/2024   EGFR 51 12/22/2024   CALCIUM  8.8 12/22/2024   PHOS 3.2 05/08/2020   PROT 6.4 07/17/2024   ALBUMIN 3.9 12/22/2024   BILITOT 1.1 07/17/2024   ALKPHOS 96 12/22/2024   AST 16 12/22/2024   ALT 16 12/22/2024   ANIONGAP 14 05/08/2020   Last lipids Lab Results  Component Value Date   CHOL 161 12/22/2024   HDL 57 12/22/2024   LDLCALC 77 12/22/2024   LDLDIRECT 91.0 06/23/2019   TRIG 158 12/22/2024   CHOLHDL 3 06/23/2019   Last hemoglobin A1c Lab Results  Component Value Date   HGBA1C 5.9 (A) 11/17/2024   HGBA1C 5.9 11/17/2024   HGBA1C 5.9 11/17/2024   HGBA1C 5.9 11/17/2024   IMPRESSION AND PLAN:  #1 UTI. Urinalysis today showed 1+ leukocytes and trace blood.  This is similar to the results he had on a urinalysis at [facility] on 12/22/2024 (?nephrol?). A urine culture has not been done and he has not been on antibiotics. Sent urine specimen for culture and sensitivity today. Bactrim  double strength, 1 twice daily x 5  days prescribed today.  #2 diabetes with retinopathy. Great control.  Most recent A1c was about 6 weeks ago--> 5.9%. As per patient request we will start Wegovy  0.5 mg weekly. Therapeutic expectations and side effect profile of medication discussed today.  Patient's questions answered. Stop glipizide. Continue pioglitazone  45 mg a day day and Jardiance  10 mg a day. Next A1c after 02/17/2025.  An After Visit Summary was printed and given to the patient.  FOLLOW UP: Return in about 4 weeks (around 01/28/2025) for f/u wegovy /DM.  Signed:  Gerlene Hockey, MD           12/31/2024      [1]  Allergies Allergen Reactions   Lipitor [Atorvastatin] Other (See Comments)    Headaches   Penicillins Rash   Zocor  [Simvastatin ] Other (See Comments)    Myalgia

## 2025-01-01 ENCOUNTER — Other Ambulatory Visit: Payer: Self-pay

## 2025-01-01 ENCOUNTER — Other Ambulatory Visit (HOSPITAL_COMMUNITY): Payer: Self-pay

## 2025-01-01 ENCOUNTER — Other Ambulatory Visit: Payer: Self-pay | Admitting: Family Medicine

## 2025-01-01 ENCOUNTER — Telehealth: Payer: Self-pay

## 2025-01-01 DIAGNOSIS — E119 Type 2 diabetes mellitus without complications: Secondary | ICD-10-CM

## 2025-01-01 LAB — URINE CULTURE
MICRO NUMBER:: 17437820
Result:: NO GROWTH
SPECIMEN QUALITY:: ADEQUATE

## 2025-01-01 MED ORDER — ACCU-CHEK GUIDE TEST VI STRP: ORAL_STRIP | 12 refills | Status: DC

## 2025-01-01 MED ORDER — ACCU-CHEK SOFTCLIX LANCETS MISC: 12 refills | Status: DC

## 2025-01-01 NOTE — Telephone Encounter (Signed)
 Please submit for Prior Auth

## 2025-01-01 NOTE — Telephone Encounter (Signed)
 Pharmacy Patient Advocate Encounter   Received notification from Physician's Office that prior authorization for Wegovy  0.5MG /0.5ML auto-injectors  is required/requested.   Insurance verification completed.   The patient is insured through [employer] [employer] [facility].   Per test claim: PA required; PA submitted to above mentioned insurance via Latent Key/confirmation #/EOC [id number] Status is pending

## 2025-01-02 ENCOUNTER — Telehealth: Payer: Self-pay

## 2025-01-02 ENCOUNTER — Ambulatory Visit: Payer: Self-pay | Admitting: Family Medicine

## 2025-01-02 MED ORDER — OZEMPIC (0.25 OR 0.5 MG/DOSE) 2 MG/3ML ~~LOC~~ SOPN: PEN_INJECTOR | SUBCUTANEOUS | 1 refills | Status: DC

## 2025-01-02 NOTE — Telephone Encounter (Signed)
 Primary Information  Source  Diguglielmo,Nancy (Non-Patient)   Subject  [name] (Patient)   Topic  Clinical - Prescription Issue    Communication  Reason for CRM: semaglutide -weight management (WEGOVY ) 0.5 MG/0.5ML SOAJ SQ injection   Patient Information  Patient Name Gender DOB SSN  [name], [name] Male [birth year] [id number]   Notes  Teresa Muta T   01/02/2025 11:38 AM  Inocente wife Called on behalf of the patient, states that the pharmacy doesn't have the semaglutide -weight management (WEGOVY ) 0.5 MG/0.5ML SOAJ SQ injection therefor they're trying to substitute the prescription medication for something else.    Any further information needed, questions, comments or concerns please feel free to contact Hobie directly at [phone]

## 2025-01-02 NOTE — Telephone Encounter (Signed)
 Pharmacy states pts insurance only allows him to test once a day. They are requesting updated instructions

## 2025-01-02 NOTE — Addendum Note (Signed)
 Addended by: FLETA CARE D on: 01/02/2025 02:12 PM   Modules accepted: Orders

## 2025-01-02 NOTE — Telephone Encounter (Signed)
 Ozempic  prescription sent.

## 2025-01-02 NOTE — Telephone Encounter (Signed)
 Spoke with pharmacist, no Prior Auth needed. Cost of medication is  $954.78 . Pt unable to pay this cost, requesting Ozempic  instead.  Please further advise. Rx pending for Ozempic 

## 2025-01-02 NOTE — Addendum Note (Signed)
 Addended by: CANDISE ALEENE DEL on: 01/02/2025 03:59 PM   Modules accepted: Orders

## 2025-01-05 NOTE — Telephone Encounter (Signed)
 Spoke with patient regarding results/recommendations.

## 2025-01-06 NOTE — Telephone Encounter (Signed)
 Pharmacy Patient Advocate Encounter  Received notification from [facility] that Prior Authorization for Wegovy  0.5mg /0.75ml has been APPROVED from 01/01/25 to 12/24/2098   PA #/Case ID/Reference #: [id number]

## 2025-01-07 ENCOUNTER — Telehealth: Payer: Self-pay

## 2025-01-07 NOTE — Telephone Encounter (Signed)
 Copied from CRM [id number]. Topic: Clinical - Medical Advice >> Jan 06, 2025 11:39 AM Victoria A wrote: Reason for CRM: Patient is taking Ozempic  for the first time and is not sure how to administer-please contact Mobile [phone]  Spoke with pt, he did his first dose yesterday or today but thinks he may have wasted 2 doses during administration process. Pt was offered to schedule nurse visit to review, he declined at this time but understands if anything changes to let us  know.

## 2025-01-28 ENCOUNTER — Other Ambulatory Visit: Payer: Self-pay

## 2025-01-28 MED ORDER — OZEMPIC (0.25 OR 0.5 MG/DOSE) 2 MG/3ML ~~LOC~~ SOPN: PEN_INJECTOR | SUBCUTANEOUS | 1 refills | Status: AC

## 2025-01-28 MED ORDER — METOPROLOL TARTRATE 50 MG PO TABS: 50.0000 mg | ORAL_TABLET | Freq: Two times a day (BID) | ORAL | 3 refills | Status: AC

## 2025-01-28 NOTE — Telephone Encounter (Signed)
 New metoprolol  prescription sent.  Ozempic  prescription sent but it will be up to insurance whether or not they will let them fill it now or not.

## 2025-01-28 NOTE — Telephone Encounter (Signed)
 RF request for metoprolol   LOV: 12/31/24 Next ov: 02/25/25 Last written: 04/24/2018  Rx pending, Please fill, if appropriate.   Communication  Reason for CRM: Pt stated he messed up his ozempic  pen needle and lost about 2 doses. He is asking if this can be replaced or reordered earlier.        Please call back to advise,   [phone]

## 2025-01-29 NOTE — Telephone Encounter (Signed)
 Spoke with pt, advised to refills sent. He has picked both from the pharmacy.

## 2025-02-18 ENCOUNTER — Ambulatory Visit: Admitting: Family Medicine

## 2025-02-25 ENCOUNTER — Ambulatory Visit: Admitting: Family Medicine

## 2025-02-25 ENCOUNTER — Ambulatory Visit
